# Patient Record
Sex: Female | Born: 1965 | Race: White | Hispanic: No | Marital: Married | Smoking: Former smoker
Health system: Southern US, Community
[De-identification: ages and names within clinical notes are randomized; demographics above are authoritative.]

## PROBLEM LIST (undated history)

## (undated) DIAGNOSIS — G4730 Sleep apnea, unspecified: Secondary | ICD-10-CM

## (undated) DIAGNOSIS — I10 Essential (primary) hypertension: Secondary | ICD-10-CM

## (undated) DIAGNOSIS — H9319 Tinnitus, unspecified ear: Secondary | ICD-10-CM

## (undated) DIAGNOSIS — G4733 Obstructive sleep apnea (adult) (pediatric): Secondary | ICD-10-CM

## (undated) DIAGNOSIS — T7840XA Allergy, unspecified, initial encounter: Secondary | ICD-10-CM

## (undated) DIAGNOSIS — G2581 Restless legs syndrome: Secondary | ICD-10-CM

## (undated) DIAGNOSIS — A6920 Lyme disease, unspecified: Secondary | ICD-10-CM

## (undated) DIAGNOSIS — F32A Depression, unspecified: Secondary | ICD-10-CM

## (undated) DIAGNOSIS — M1990 Unspecified osteoarthritis, unspecified site: Secondary | ICD-10-CM

## (undated) DIAGNOSIS — T07XXXA Unspecified multiple injuries, initial encounter: Secondary | ICD-10-CM

## (undated) DIAGNOSIS — F329 Major depressive disorder, single episode, unspecified: Secondary | ICD-10-CM

## (undated) DIAGNOSIS — E785 Hyperlipidemia, unspecified: Secondary | ICD-10-CM

## (undated) DIAGNOSIS — E669 Obesity, unspecified: Secondary | ICD-10-CM

## (undated) DIAGNOSIS — J45909 Unspecified asthma, uncomplicated: Secondary | ICD-10-CM

## (undated) DIAGNOSIS — E079 Disorder of thyroid, unspecified: Secondary | ICD-10-CM

## (undated) DIAGNOSIS — G43909 Migraine, unspecified, not intractable, without status migrainosus: Secondary | ICD-10-CM

## (undated) HISTORY — DX: Allergy, unspecified, initial encounter: T78.40XA

## (undated) HISTORY — DX: Restless legs syndrome: G25.81

## (undated) HISTORY — DX: Lyme disease, unspecified: A69.20

## (undated) HISTORY — DX: Unspecified multiple injuries, initial encounter: T07.XXXA

## (undated) HISTORY — DX: Depression, unspecified: F32.A

## (undated) HISTORY — PX: KNEE SURGERY: SHX244

## (undated) HISTORY — DX: Unspecified asthma, uncomplicated: J45.909

## (undated) HISTORY — DX: Unspecified osteoarthritis, unspecified site: M19.90

## (undated) HISTORY — DX: Sleep apnea, unspecified: G47.30

## (undated) HISTORY — DX: Obstructive sleep apnea (adult) (pediatric): G47.33

## (undated) HISTORY — DX: Hyperlipidemia, unspecified: E78.5

## (undated) HISTORY — DX: Disorder of thyroid, unspecified: E07.9

## (undated) HISTORY — PX: FRACTURE SURGERY: SHX138

## (undated) HISTORY — DX: Obesity, unspecified: E66.9

## (undated) HISTORY — DX: Essential (primary) hypertension: I10

## (undated) HISTORY — DX: Tinnitus, unspecified ear: H93.19

## (undated) HISTORY — DX: Major depressive disorder, single episode, unspecified: F32.9

## (undated) HISTORY — DX: Migraine, unspecified, not intractable, without status migrainosus: G43.909

---

## 1993-06-26 HISTORY — PX: LAPAROSCOPIC ABDOMINAL EXPLORATION: SHX6249

## 1998-09-24 HISTORY — PX: WRIST SURGERY: SHX841

## 1998-10-24 ENCOUNTER — Ambulatory Visit (HOSPITAL_BASED_OUTPATIENT_CLINIC_OR_DEPARTMENT_OTHER): Admission: RE | Admit: 1998-10-24 | Discharge: 1998-10-24 | Payer: Self-pay | Admitting: Orthopedic Surgery

## 2006-12-07 ENCOUNTER — Encounter: Admission: RE | Admit: 2006-12-07 | Discharge: 2006-12-07 | Payer: Self-pay | Admitting: Orthopedic Surgery

## 2008-10-24 HISTORY — PX: JOINT REPLACEMENT: SHX530

## 2008-10-31 ENCOUNTER — Inpatient Hospital Stay (HOSPITAL_COMMUNITY): Admission: RE | Admit: 2008-10-31 | Discharge: 2008-11-01 | Payer: Self-pay | Admitting: Orthopedic Surgery

## 2008-11-14 ENCOUNTER — Ambulatory Visit (HOSPITAL_COMMUNITY): Admission: RE | Admit: 2008-11-14 | Discharge: 2008-11-15 | Payer: Self-pay | Admitting: Orthopedic Surgery

## 2010-09-02 LAB — URINALYSIS, ROUTINE W REFLEX MICROSCOPIC
Bilirubin Urine: NEGATIVE
Glucose, UA: NEGATIVE mg/dL
Hgb urine dipstick: NEGATIVE
Ketones, ur: NEGATIVE mg/dL
Nitrite: NEGATIVE
Protein, ur: NEGATIVE mg/dL
Specific Gravity, Urine: 1.01 (ref 1.005–1.030)
Urobilinogen, UA: 0.2 mg/dL (ref 0.0–1.0)
pH: 6.5 (ref 5.0–8.0)

## 2010-09-02 LAB — BASIC METABOLIC PANEL
BUN: 5 mg/dL — ABNORMAL LOW (ref 6–23)
CO2: 27 mEq/L (ref 19–32)
Calcium: 8.9 mg/dL (ref 8.4–10.5)
Calcium: 9.6 mg/dL (ref 8.4–10.5)
Calcium: 8.9 mg/dL (ref 8.4–10.5)
Chloride: 104 mEq/L (ref 96–112)
Creatinine, Ser: 0.58 mg/dL (ref 0.4–1.2)
Creatinine, Ser: 0.56 mg/dL (ref 0.4–1.2)
GFR calc Af Amer: >60 mL/min (ref >60)
GFR calc non Af Amer: >60 mL/min (ref >60)
GFR calc non Af Amer: >60 mL/min (ref >60)
GFR calc non Af Amer: >60 mL/min (ref >60)
GFR calc non Af Amer: >60 mL/min (ref >60)
Glucose, Bld: 101 mg/dL — ABNORMAL HIGH (ref 70–99)
Glucose, Bld: 115 mg/dL — ABNORMAL HIGH (ref 70–99)
Glucose, Bld: 96 mg/dL (ref 70–99)
Glucose, Bld: 99 mg/dL (ref 70–99)
Potassium: 4.3 mEq/L (ref 3.5–5.1)
Potassium: 3.8 mEq/L (ref 3.5–5.1)
Potassium: 4.7 mEq/L (ref 3.5–5.1)
Sodium: 138 mEq/L (ref 135–145)
Sodium: 142 mEq/L (ref 135–145)
Sodium: 142 mEq/L (ref 135–145)

## 2010-09-02 LAB — DIFFERENTIAL
Basophils Absolute: 0 10*3/uL (ref 0.0–0.1)
Basophils Relative: 0 % (ref 0–1)
Eosinophils Relative: 3 % (ref 0–5)
Lymphocytes Relative: 24 % (ref 12–46)
Lymphocytes Relative: 26 % (ref 12–46)
Lymphs Abs: 2.6 10*3/uL (ref 0.7–4.0)
Monocytes Absolute: 0.9 10*3/uL (ref 0.1–1.0)
Monocytes Absolute: 0.6 10*3/uL (ref 0.1–1.0)
Neutro Abs: 8.8 10*3/uL — ABNORMAL HIGH (ref 1.7–7.7)
Neutrophils Relative %: 66 % (ref 43–77)

## 2010-09-02 LAB — TYPE AND SCREEN
ABO/RH(D): B POS
Antibody Screen: NEGATIVE
Antibody Screen: NEGATIVE

## 2010-09-02 LAB — CBC
HCT: 41.2 % (ref 36.0–46.0)
HCT: 44.7 % (ref 36.0–46.0)
Hemoglobin: 12.5 g/dL (ref 12.0–15.0)
Hemoglobin: 16.6 g/dL — ABNORMAL HIGH (ref 12.0–15.0)
Hemoglobin: 15 g/dL (ref 12.0–15.0)
MCHC: 34.5 g/dL (ref 30.0–36.0)
MCV: 82.5 fL (ref 78.0–100.0)
Platelets: 316 10*3/uL (ref 150–400)
Platelets: 257 10*3/uL (ref 150–400)
RDW: 13.4 % (ref 11.5–15.5)
RDW: 13.4 % (ref 11.5–15.5)
RDW: 13.6 % (ref 11.5–15.5)
WBC: 13.9 10*3/uL — ABNORMAL HIGH (ref 4.0–10.5)
WBC: 9.9 10*3/uL (ref 4.0–10.5)

## 2010-09-02 LAB — PROTIME-INR
INR: 1 (ref 0.00–1.49)
Prothrombin Time: 13.1 seconds (ref 11.6–15.2)

## 2010-09-02 LAB — PREGNANCY, URINE
Preg Test, Ur: NEGATIVE
Preg Test, Ur: NEGATIVE

## 2010-09-02 LAB — GLUCOSE, CAPILLARY: Glucose-Capillary: 111 mg/dL — ABNORMAL HIGH (ref 70–99)

## 2010-09-02 LAB — APTT: aPTT: 34 seconds (ref 24–37)

## 2010-10-08 NOTE — H&P (Signed)
NAME:  Candace Rodriguez, Candace Rodriguez           ACCOUNT NO.:  [id number]   MEDICAL RECORD NO.:  [id number]          PATIENT TYPE:  INP   LOCATION:  NA                           FACILITY:  [facility]   PHYSICIAN:  Madlyn Frankel. Charlann Boxer, M.D.  DATE OF BIRTH:  [birth year]   DATE OF ADMISSION:  11/14/2008  DATE OF DISCHARGE:                              HISTORY & PHYSICAL   PROCEDURE:  Left unicondylar knee replacement of the medial compartment.   CHIEF COMPLAINT:  Left knee pain.   HISTORY OF PRESENT ILLNESS:  A 45 year old female with a history of left  medial knee pain secondary to medial compartment osteoarthritis.  Has  been refractory to all conservative treatment.  She does have a recent  history of a right partial knee replacement of the medial compartment as  well.  She is making progress with that procedure.   PAST MEDICAL HISTORY:  1. Osteoarthritis.  2. Sleep apnea.  3. Migraines.  4. PCOS.  5. Degenerative disk disease.  6. Hypothyroidism.  7. Hypertension.  8. Dyslipidemia.   PAST SURGICAL HISTORY:  1. Right partial knee replacement in 2010.  2. Laparoscopic surgery 1995.  3. Wrist surgery 2000.   FAMILY HISTORY:  Hypertension, arthritis, hypothyroidism, coronary  artery disease, dementia.   SOCIAL HISTORY:  Married.  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 Primary care in the home postoperatively.   ALLERGIES:  PENICILLIN.   PRIMARY CARE PHYSICIAN:  Birdena Jubilee, nurse practitioner.   Other physicians include Dr. Sharene Skeans and Dr. Ernestina Penna.   MEDICATIONS:  1. Liothyronine 12.5 mcg daily.  2. Levoxyl 175 mcg daily.  3. Diovan HCT 160/25 p.o. daily.  4. Metformin 1000 mg p.o. daily.  5. Simvastatin 40 mg p.o. daily.  6. Requip 1 mg p.o. daily.  7. Etodolac 600 mg p.o. b.i.d.  8. Norethindrone 5 mg p.o. daily.  9. Fluticasone 2 puffs each nostril daily.  10.Xyzal 5 mg daily.  11.Phentermine 37.5 mg p.o. daily.  12.Vitamin B12 shot once a month.  13.Multivitamin daily.  14.Vitamin E daily.  15.Citracal daily.  16.B-complex daily  17.Osteobiflex daily.  18.Fish oil 1000 mg t.i.d.  19.Norco 7.5/325 one to two p.o. q.4 to6 p.r.n. pain.  20.Robaxin 500 mg 1 p.o. q.6 hours p.r.n. muscle spasm pain.  21.Iron 3125 mg 1 p.o. t.i.d.  22.Mobic 7.5 mg p.o. b.i.d.   REVIEW OF SYSTEMS:  RESPIRATORY:  She wears a CPAP at home. I will  require use in the hospital. Otherwise, see HIP.   PHYSICAL EXAMINATION:  VITAL SIGNS:  Pulse 120, respiratory rate 16,  blood pressure 140/90.  GENERAL:  Awake, alert and oriented.  HEENT:  Normocephalic.  NECK:  Supple. No carotid bruits.  CHEST/LUNGS:  Clear to auscultation bilaterally.  BREAST:  Deferred.  CARDIOVASCULAR:  S1 and S2 distinct.  ABDOMEN:  Soft, bowel sounds present.  PELVIS:  Stable.  GENITOURINARY:  Deferred.  EXTREMITIES:  Right knee incision healing well with no active drainage.  A little bit of redness around the incision.  Left knee has medial sided  knee pain and increased pain with weightbearing.  SKIN:  As  noted right knee incision healing well with some mild redness.  Left knee, no cellulitis.  NEUROLOGIC:  Intact distal sensibilities bilateral lower extremities.   LABORATORY DATA:  Labs, EKG, chest x-ray pending.   IMPRESSION:  Left medial knee osteoarthritis.   PLAN:  Plan of action is a left unicondylar knee replacement of the  medial compartment by Dr. Charlann Boxer at [facility] November 14, 2008.  Postoperative DVT prophylaxis will be aspirin.  Questions were  encouraged, answered and reviewed.     ______________________________  Yetta Glassman [address], [place]      Madlyn Frankel. Charlann Boxer, M.D.  Electronically Signed    BLM/MEDQ  D:  11/08/2008  T:  11/08/2008  Job:  [id number]   cc:   Birdena Jubilee, CNP   Dr. Cydney Ok Buist  Fax: [phone]

## 2010-10-08 NOTE — H&P (Signed)
Candace Rodriguez, YEATS           ACCOUNT NO.:  [id number]   MEDICAL RECORD NO.:  [id number]          PATIENT TYPE:  INP   LOCATION:  NA                           FACILITY:  [facility]   PHYSICIAN:  Madlyn Frankel. Charlann Boxer, M.D.  DATE OF BIRTH:  01-20-1966   DATE OF ADMISSION:  10/31/2008  DATE OF DISCHARGE:                              HISTORY & PHYSICAL   PROCEDURE:  Right unicondylar knee replacement of the medial  compartment.   CHIEF COMPLAINTS:  Right knee pain.   HISTORY OF PRESENT ILLNESS:  A 45 year old female with a history of  right medial knee pain secondary to medial compartment osteoarthritis.  It has been refractory to all conservative treatment.  She is scheduled  for a right partial knee replacement, to be followed in 2 weeks by left  partial knee replacement.   PAST MEDICAL HISTORY:  1. Osteoarthritis.  2. Sleep apnea.  3. Migraines.  4. PCOS.  5. Degenerative disk disease.  6. Hypothyroidism.  7. Hypertension.  8. Dyslipidemia.   PREVIOUS SURGERIES:  1. Laparoscopic surgery in 1995.  2. Wrist surgery in 2000.   FAMILY HISTORY:  Hypertension, arthritis, hypothyroidism, coronary  artery disease, dementia.   SOCIAL HISTORY:  She is married.  She is a Warehouse manager  with a Master's degree.  Primary caregiver will be family in the home.   DRUG ALLERGIES:  PENICILLIN.   PRIMARY CARE PHYSICIAN:  Paulita Cradle, nurse practitioner.   OTHER PHYSICIANS:  Include Dr. Erma Pinto and Dr. Ernestina Penna.   MEDICATIONS:  1. Liothyronine 12.5 mcg daily.  2. Levoxyl 175 mcg daily.  3. Diovan/HCT 160/25 p.o. daily.  4. Metformin 1000 mg p.o. daily.  5. Simvastatin 40 mg p.o. daily.  6. Requip 1 mg p.o. daily.  7. Etodolac 600 mg p.o. b.i.d.  8. Norethindrone 5 mg p.o. daily.  9. Fluticasone 2 puffs each nostril daily.  10.Xyzal 5 mg daily.  11.Phentermine 37.5 mg p.o. daily.  12.Vitamin B12 shot once a month.  13.Multivitamin daily.  14.Vitamin E  daily.  15.Citracal daily.  16.B complex daily.  17.Osteo Bi-Flex daily.  18.Fish oil 1000 mg t.i.d.   REVIEW OF SYSTEMS:  HEENT:  She has ringing in her ears.  CARDIOVASCULAR:  Last EKG was in March 2009.  MUSCULOSKELETAL:  She has  joint pain, back pain, morning stiffness.  Otherwise see HPI.   PHYSICAL EXAMINATION:  VITAL SIGNS:  Pulse is 84, respirations 16, blood  pressure 134/82.  GENERAL:  Awake, alert and oriented.  HEENT:  Normocephalic.  NECK:  Supple.  No carotid bruits.  CHEST:  Lungs clear to auscultation bilaterally.  BREASTS:  Deferred.  HEART:  S1 and S2 distinct.  ABDOMEN:  Soft, bowel sounds present.  PELVIS:  Stable.  GENITOURINARY:  Deferred.  EXTREMITIES:  Bilateral knee pain with range of motion and with  weightbearing, predominantly medial side.  SKIN:  No cellulitis  NEUROLOGIC:  Intact distal sensibilities.   LABORATORY DATA:  Labs, EKG, chest x-ray all pending presurgical  testing.   IMPRESSION:  Right medial osteoarthritis of the knee.   Plan of action is a  right unicondylar knee replacement of the medial  compartment by Dr. Durene Romans at [facility] on October 31, 2008.  Risks and complications were discussed.  Postoperative medications were  provided.     ______________________________  Yetta Glassman [address], [place]      Madlyn Frankel. Charlann Boxer, M.D.  Electronically Signed    BLM/MEDQ  D:  10/25/2008  T:  10/25/2008  Job:  [id number]   cc:   Paulita Cradle, NP   Dr. Jeryl Columbia  Fax: [phone]

## 2010-10-08 NOTE — Op Note (Signed)
Candace Rodriguez, Candace Rodriguez           ACCOUNT NO.:  [id number]   MEDICAL RECORD NO.:  [id number]          PATIENT TYPE:  INP   LOCATION:  1611                         FACILITY:  [facility]   PHYSICIAN:  Madlyn Frankel. Charlann Boxer, M.D.  DATE OF BIRTH:  01/19/1966   DATE OF PROCEDURE:  11/14/2008  DATE OF DISCHARGE:                               OPERATIVE REPORT   PREOPERATIVE DIAGNOSIS:  Left knee medial compartment osteoarthritis.   POSTOPERATIVE DIAGNOSIS:  Left knee medial compartment osteoarthritis.   PROCEDURE:  Left partial knee replacement utilizing Biomet Oxford knee  system, size medium femur, A left tibia and a 4 mm insert to match the  medial femur with the left A tibia.   SURGEON:  Madlyn Frankel. Charlann Boxer, M.D.   ASSISTANT:  Dwyane Luo, PA-C.   ANESTHESIA:  Spinal.   ESTIMATED BLOOD LOSS:  150 mL.   TOURNIQUET TIME:  47 minutes at 250 mmHg.   DRAINS:  One Hemovac.   COMPLICATIONS:  None.   INDICATIONS:  Ms. Kossman is a 45 year old female patient known to me  with bilateral knee medial compartment degenerative changes,  successfully undergone a right partial knee.  She is here for scheduled  left partial knee.  Risks and benefits have been reviewed at the time of  her history and physical.  Consent was obtained for benefit of pain  relief.   PROCEDURE IN DETAIL:  The patient was brought to the operative theater.  Once adequate anesthesia, preoperative antibiotics, Cleocin  administered, the patient was positioned supine on the table.  Her left  lower extremity was placed into a thigh tourniquet.  It was then  positioned onto the leg holder to allow for 120 degrees of flexion.  The  left lower extremity was then pre scrubbed, prepped and draped in  sterile fashion.  A time-out was performed identifying the patient and  planned procedure and extremity.  The leg was exsanguinated, tourniquet  elevated to 250 mmHg.  Paramidline incision was made from proximal pole  of the patella to the  tubercle.  Soft tissue dissection was carried down  to the extensor mechanism freeing up the soft tissues.   A median arthrotomy was made.  Medial subluxation of the patella was  carried out.  Following an initial synovial debridment and osteophyte  debridement, attention was directed to the tibia.  With an  extramedullary guide positioned underneath the osteophytic ridge and  held perpendicular to the tibial shaft to assure good cut, a  reciprocating saw was used first followed by an oscillating saw.  I  checked the space with a size 8 tibial tray, size A in addition to the 4  feeler gauge it was tight.  For this reason I had kept the pin in  position and took the extramedullary guide down one notch removing  another 3 mm of bone.  With this, the 4 feeler gauge felt good with the  appropriate tension.   The pin was removed.  Attention was now directed to the femur.  Femoral  canal was opened with a drill, then used the hand awl and an  intramedullary rod passed  easily by hand.   I then placed a size 3 feeler gauge in the posterior cutting block,  drill hole guide onto the feeler gauge and had it oriented into the  middle third of the medial femoral condyle and oriented in respect to  the intramedullary rod.  The posterior cut was then made and bone wafer  removed.   At this point, I went ahead and used a size 4 spigot and milled the  distal femur.  Excessive bone was removed with osteotomes and rongeur.  Trial reduction now carried out with an A tibia, a medium femur.  At  this point the 4 feeler gauge felt again good in flexion.  However in  extension, it was a little bit tight.  I removed the trial, placed a 5  spigot and then milled the distal femur again and retrialed and was  happy that the tension appeared to be balanced in extension and flexion.  At this point, the trial components removed.  The tibial tray was held  in place, pinned into its correct orientation against the  cut surface of  the spine and the reciprocating saw used to create the trough.  I used  the osteotome to create the trough for the keel.   The trial reduction was now carried out with a medium femur.  The keeled  tibia and the lollipop retractors size 4 in an appropriate amount of  tension at 90 degrees of flexion and 20 degrees of flexion.  Given these  parameters, all trial components were removed.  We drilled the sclerotic  bone.  I injected the medial synovial capsule junction with 30 mL of  0.25% Marcaine with 1 mL of Toradol.  Cement was mixed.  The knee was  irrigated with normal saline solution.  Final components opened.   Final components were cemented in a semi 2 staged fashion with tibial  component cemented first, the knee held at 45 degrees of flexion for 2  minutes and the femoral component.  At each step, excessive cement was  removed.  Once cement had cured, excessive cement was removed throughout  the knee.  Once I was satisfied I was unable to visualize any loose  cement, I retrialed to make certain that the size 4 component would feel  good.  Once I was satisfied here,  the final 4 components matched the  medium femur, it was then opened and inserted.  The knee was irrigated  at various points throughout the case and again at this point,  tourniquet was let down.  There was no significant hemostasis required.  A medium Hemovac drain was placed deep.  I then reapproximated the  extensor mechanism and 9 degrees of flexion with #1 Vicryl and remainder  of wound was closed with 2-0 Vicryl and running 4-0 Monocryl.  The knee  was cleaned, dried and dressed sterilely with Steri-Strips and placed in  a bulky Jones dressing.  She tolerated the procedure well and was  brought to recovery room stable.      Madlyn Frankel Charlann Boxer, M.D.  Electronically Signed     MDO/MEDQ  D:  11/14/2008  T:  11/14/2008  Job:  [id number]

## 2010-10-08 NOTE — Op Note (Signed)
NAMENIQUITA, DIGIOIA           ACCOUNT NO.:  [id number]   MEDICAL RECORD NO.:  [id number]          PATIENT TYPE:  INP   LOCATION:  0003                         FACILITY:  [facility]   PHYSICIAN:  Madlyn Frankel. Charlann Boxer, M.D.  DATE OF BIRTH:  [birth year]   DATE OF PROCEDURE:  DATE OF DISCHARGE:                               OPERATIVE REPORT   PREOPERATIVE DIAGNOSIS:  Right medial compartment osteoarthritis.   POSTOPERATIVE DIAGNOSIS:  Right medial compartment osteoarthritis.   PROCEDURE:  Right partial knee replacement.   COMPONENTS USED:  The Biomet Oxford knee system, size medium femur,  sized B medial tibial component with a size 4 insert to match the medial  femoral component for the right tibia.   SURGEON:  Madlyn Frankel. Charlann Boxer, M.D.   ASSISTANT:  Surgical techs.   ANESTHESIA:  Spinal block.   FINDING:  None.   SPECIMENS:  None.   COMPLICATIONS/DRAINS:  One Hemovac.   TOURNIQUET TIME:  38 minutes at 250 mmHg.   INDICATION FOR THE PROCEDURE:  Ms. Junio is a 45 year old female  who presented to the office for evaluation of knee pain.  We had  unsuccessful in managing her knee pain with conservative measures.  This  included injections, anti-inflammatory medication, attempted weight loss  and strengthening.  She wished to discuss more definitive measures.  Her  predominant pain was in the medial side of the knee.  This correlated  with the radiographic findings of bone on bone arthritis.  After  reviewing the risks of infection, DVT, the concerns about her age and  size, consent was obtained for a right partial knee replacement for  benefit of pain relief.   PROCEDURE IN DETAIL:  The patient was brought to operative theater.  Once adequate anesthesia, preoperative antibiotics, clindamycin  administered, the patient was positioned supine.  The thigh tourniquet  was placed in the right thigh.  The right leg was then positioned over  the Oxford leg holder to allow for flexion to  120 degrees in full  extension.  We then prepped and draped the right lower extremity from  tourniquet to the toes using the following pre scrub.  Time-out was  performed identifying the patient and procedure and extremity.   The leg was exsanguinated and the tourniquet elevated to 250 mmHg.  Paramidline incision was made just proximal to pole of patella and  tibial tubercle.  Sharp dissection was carried down to the extensor  mechanism creating soft tissue planes.  A median arthrotomy was then  created.  The patella was subluxated laterally with debridement synovium  and osteophytes was carried out per protocol.   An extramedullary guide was then placed just underneath the osteophytic  rim of the medial proximal tibia.  The reciprocating saw was then aimed  towards the hip joint.  The oscillating saw was used to remove the bone  fragment.  The cut surface appeared to be best fit with size B tibia.  The 4-mm feeler gauge seemed to fit into this space with appropriate  tension.   Attention was now directed to the femoral side and drill was used open  up  the distal femoral canal and then augmented with the femoral awl.  An  intramedullary rod was passed into the femoral canal.   __________ feeler gauge was set on top of the tibia and the posterior  femoral cutting guide inserted.  Once it was parallel in all planes that  were necessary in the middle of the medial femoral condyle, two drill  holes were drilled and confirmed.  The posterior cutting block was then  placed and the posterior femoral cut made.   Based on the size 4 feeler gauge and experience with these knees and  medial compartment osteoarthritis, a 4 spigot was inserted in the distal  femur milled.  A trial reduction now carried out using a 4 feeler gauge  which had appropriate tension in flexion and in 20 degrees of flexion  had the same type of tension.  This indicated we had a balanced knee.   At this point a trial  components removed.  I held the tibial component  in placed, pinned it in position using reciprocating saw to create the  trough for the keel and then used an osteotome to remove further debris.   The trial reduction was again carried out with a B keeled tibial  component and medium femoral component and a lollipop size 4.  Again at  90 degrees of flexion and 20 degrees flexion, the knee appeared to be  stable and balanced with enough play in the polyethylene to match each  other.   At this point all trial components removed.  I drilled sclerotic bone  with a drill.  The final components were opened and mixed.  The synovial  capsule junction was injected with 30 mL of 0.25% Marcaine with  epinephrine and Toradol.  Tibial component was cemented in first.  The  knee was brought to 45 degrees of flexion with the trial femur in place  and extruded cement removed.  After 2 minutes the femoral component was  put in position and the knee held at  45 degrees flexion for cement  cure.  Excessive cement was removed at this point.  Once I was satisfied  I was unable to visualize any further cement and based on the trial  reduction, we found a 4 insert matched the medial femoral condyle and  the right side was then inserted.   We reirrigated the knee at this point, tourniquet was let down at 38  minutes.  A medium Hemovac drain was placed deep.  Extensor mechanism  was then reapproximated using #1Vicryl with the knee in flexion.  The  remainder of the wound was closed with 2-0 Vicryl and running 4-0  Monocryl.  The knee was cleaned, dried and dressed sterilely with Steri-  Strips and bulky sterile wrap.  She was brought to the recovery room in  stable condition tolerating procedure well.      Madlyn Frankel Charlann Boxer, M.D.  Electronically Signed     MDO/MEDQ  D:  10/31/2008  T:  10/31/2008  Job:  [id number]

## 2010-12-18 LAB — HM DIABETES EYE EXAM

## 2012-05-24 LAB — BASIC METABOLIC PANEL
BUN: 12 mg/dL (ref 4–21)
Creatinine: 0.5 mg/dL (ref 0.5–1.1)

## 2012-07-12 LAB — LIPID PANEL
HDL: 27 mg/dL — AB (ref 35–70)
LDL Cholesterol: 69 mg/dL

## 2012-07-31 ENCOUNTER — Encounter: Payer: Self-pay | Admitting: *Deleted

## 2012-08-12 ENCOUNTER — Ambulatory Visit (INDEPENDENT_AMBULATORY_CARE_PROVIDER_SITE_OTHER): Payer: BC Managed Care – PPO | Admitting: Pharmacist

## 2012-08-12 VITALS — BP 122/74 | Ht 68.0 in | Wt 306.0 lb

## 2012-08-12 DIAGNOSIS — E785 Hyperlipidemia, unspecified: Secondary | ICD-10-CM

## 2012-08-12 NOTE — Progress Notes (Signed)
  Subjective:    Patient ID: Candace Rodriguez, female    DOB: 08/27/1965, 47 y.o.   MRN: [id number]  HPI Comments: Patient is here for weight management secondary to morbid obesity.  She is planning to have either gastric bypass or gastric sleeve surgery in future and her insurance requires that she enroll in a physician supervised diet management program.   She has lost a total of 42 pounds since starting at our clinic in November 2013.  Patient has multiple comorbidities complicated by obesity including HTN, Hypertrigluceridemia / dyslipidemia, degenerative disc disease and asthma.      Review of Systems  Constitutional: Positive for activity change (increase in activity in Feb including biking, but has decreased in March due to allergies.). Negative for appetite change and fatigue.  Respiratory: Positive for wheezing (Relieved by rescue inhaler.  Not using daily. ). Negative for apnea, cough, choking, chest tightness, shortness of breath and stridor.   Cardiovascular: Negative for chest pain.  Endocrine: Negative for polydipsia, polyphagia and polyuria.  Musculoskeletal: Positive for back pain (Occassional.  Patient reported on significant increase in pain over last month.). Negative for myalgias, joint swelling and gait problem.  Neurological: Negative for dizziness.   Diet Review:  Pt brings in food diary - reviewed.  Averages about 1600kcal/day. Continues to try to increase non starchy vegetable intake and fruits, lean proteins    Objective:   Physical Exam  Constitutional: She appears well-developed and well-nourished.  Cardiovascular: Normal rate and regular rhythm.           Assessment & Plan:  Assessment: Morbid Obesity - decreased weight over last month is 5lbs and a total of 42 lbs since 03/2012 Hypertriglyceriemia - improved per labs from 07/12/2012 Low HDL - improved from 24 to 27 from 05/24/2012 to 07/12/2012 HTN - well controlled   Plan No medication changes  today Continue caloric restrictive meal - goal 1500 to 1600 kcal per day Restart biking on a regular basis Recommended small 100 calorie snack in early afternoon to help with increased hunger between 5pm and 8pm. RTC 1 month

## 2012-08-23 ENCOUNTER — Other Ambulatory Visit: Payer: Self-pay

## 2012-08-23 MED ORDER — DICLOFENAC SODIUM 75 MG PO TBEC: 75.0000 mg | DELAYED_RELEASE_TABLET | Freq: Two times a day (BID) | ORAL | Status: DC

## 2012-08-23 NOTE — Telephone Encounter (Signed)
Do not see this med listed in chart

## 2012-09-09 ENCOUNTER — Other Ambulatory Visit: Payer: Self-pay

## 2012-09-09 MED ORDER — LEVOTHYROXINE SODIUM 75 MCG PO TABS: 75.0000 ug | ORAL_TABLET | Freq: Every day | ORAL | Status: DC

## 2012-09-13 ENCOUNTER — Ambulatory Visit (INDEPENDENT_AMBULATORY_CARE_PROVIDER_SITE_OTHER): Payer: BC Managed Care – PPO | Admitting: Pharmacist

## 2012-09-13 ENCOUNTER — Encounter: Payer: Self-pay | Admitting: Pharmacist

## 2012-09-13 DIAGNOSIS — E785 Hyperlipidemia, unspecified: Secondary | ICD-10-CM | POA: Insufficient documentation

## 2012-09-13 DIAGNOSIS — M1990 Unspecified osteoarthritis, unspecified site: Secondary | ICD-10-CM | POA: Insufficient documentation

## 2012-09-13 DIAGNOSIS — F32A Depression, unspecified: Secondary | ICD-10-CM | POA: Insufficient documentation

## 2012-09-13 DIAGNOSIS — M109 Gout, unspecified: Secondary | ICD-10-CM | POA: Insufficient documentation

## 2012-09-13 DIAGNOSIS — E039 Hypothyroidism, unspecified: Secondary | ICD-10-CM | POA: Insufficient documentation

## 2012-09-13 DIAGNOSIS — G2581 Restless legs syndrome: Secondary | ICD-10-CM

## 2012-09-13 DIAGNOSIS — F329 Major depressive disorder, single episode, unspecified: Secondary | ICD-10-CM | POA: Insufficient documentation

## 2012-09-13 DIAGNOSIS — I10 Essential (primary) hypertension: Secondary | ICD-10-CM | POA: Insufficient documentation

## 2012-09-13 DIAGNOSIS — G43909 Migraine, unspecified, not intractable, without status migrainosus: Secondary | ICD-10-CM | POA: Insufficient documentation

## 2012-09-13 HISTORY — DX: Morbid (severe) obesity due to excess calories: E66.01

## 2012-09-13 NOTE — Progress Notes (Signed)
  Subjective:    Patient ID: Florinda Marker, female    DOB: 1965/08/16, 47 y.o.   MRN: [id number]  HPI Patient has been coming to [facility] for counseling regarding weight loss since 04/08/2012.   Mrs. Wilbourn also sees a Health and safety inspector at [facility].   The patient is planning to have bariatric surgery and her insurance required that she have 6 months of physician supervised weight loss counseling and education.  Mrs. Paulding has changed her dietary intake and physical activity habits tremendously and at our last visit had lost a total of 42# since November and BMI has decreased from 52.9 in November to 46.54 in March 2014.  Co-morbid conditions - dyslipidemia, hypertension, DJD, depression  Body mass index is 45.36 kg/(m^2).  Filed Vitals:   09/13/12 1505  BP: 112/72   Filed Weights   09/13/12 1505  Weight: 298 lb 4 oz (135.285 kg)   Lipid Panel     Component Value Date/Time   CHOL 125 07/12/2012   TRIG 146 07/12/2012   HDL 27* 07/12/2012   LDLCALC 69 07/12/2012      Review of Systems     Objective:   Physical Exam       Assessment & Plan:  Assessment: 1. Obesity - continue to see weight loss with current diet and exercise plan 2.  HTN- well controlled 3. Dyslipidemia - at goals - improvements seen with weight loss   Plan: 1.  Continue calorie restrictive diet - current goal is 1600 - 1700 calories per day 2. Continue increased physical activity - goal 30 minutes daily at least 4 days per week 3.  Continue current BP medication Diovan HCT 320.25mg  1/2 tablet daily 4.  Continue current lipid lowering regimen Crestor 20mg  qd  RTC 1 month  Physician time spent with patient - 5 minutes Total time spent with paient - 15 minutes  Rudi Heap, MD and Henrene Pastor, PharmD, CPP

## 2012-09-17 ENCOUNTER — Other Ambulatory Visit: Payer: Self-pay

## 2012-09-17 MED ORDER — ROSUVASTATIN CALCIUM 20 MG PO TABS: 20.0000 mg | ORAL_TABLET | Freq: Every day | ORAL | Status: DC

## 2012-10-07 ENCOUNTER — Other Ambulatory Visit: Payer: Self-pay

## 2012-10-07 MED ORDER — LEVOTHYROXINE SODIUM 175 MCG PO TABS: 175.0000 ug | ORAL_TABLET | Freq: Every day | ORAL | Status: DC

## 2012-10-07 MED ORDER — METFORMIN HCL 1000 MG PO TABS: ORAL_TABLET | ORAL | Status: DC

## 2012-10-14 ENCOUNTER — Ambulatory Visit (INDEPENDENT_AMBULATORY_CARE_PROVIDER_SITE_OTHER): Payer: BC Managed Care – PPO | Admitting: Pharmacist

## 2012-10-14 VITALS — BP 112/72 | HR 70 | Ht 68.0 in | Wt 293.0 lb

## 2012-10-14 DIAGNOSIS — I10 Essential (primary) hypertension: Secondary | ICD-10-CM

## 2012-10-14 DIAGNOSIS — E785 Hyperlipidemia, unspecified: Secondary | ICD-10-CM

## 2012-10-14 DIAGNOSIS — R635 Abnormal weight gain: Secondary | ICD-10-CM

## 2012-10-14 NOTE — Progress Notes (Signed)
  Subjective:    Patient ID: Candace Rodriguez, female    DOB: 08-16-1965, 47 y.o.   MRN: [id number]  HPI Candace Rodriguez here today for reassessment of obesity and related chronic conditions.  She is planning to have bariatric surgery.  Patient has been coming to [facility] for counseling regarding weight loss since 04/08/2012.   Candace Rodriguez also sees a Health and safety inspector at [facility]. The patient continues to keep daily records of dietary intake and physical activity.  She averages 1600 kcal per day.  Her health, diet and physical activity habits have improved tremendously over the last 6 months and at our last visit she had lost a total of 50 lbs since November and BMI has decreased from 52.9 in November to 45.36 in April 2014.  Co-morbid conditions - dyslipidemia, hypertension, DJD, depression  Filed Weights   10/14/12 1512  Weight: 293 lb (132.904 kg)   Body mass index is 44.56 kg/(m^2).  Filed Vitals:   10/14/12 1512  BP: 112/72  Pulse: 70     Lipid Panel     Component Value Date/Time   CHOL 125 07/12/2012   TRIG 146 07/12/2012   HDL 27* 07/12/2012   LDLCALC 69 07/12/2012            Assessment & Plan:  Assessment: 1. Obesity - continued weight loss with current diet and exercise plan 2.  HTN- well controlled 3. Dyslipidemia - at goals - improvements seen with weight loss   Plan: 1.  Continue calorie restrictive diet - current goal is 1600 - 1700 calories per day 2. Continue increased physical activity - goal 30 minutes daily at least 4 days per week 3.  Continue current BP medication Diovan HCT 320.25mg  1/2 tablet daily 4.  Continue current lipid lowering regimen Crestor 20mg     Physician time spent with patient - 5 minutes Total time spent with paient - 20 minutes  Rudi Heap, MD and Henrene Pastor, PharmD, CPP

## 2012-10-22 ENCOUNTER — Other Ambulatory Visit: Payer: Self-pay

## 2012-10-22 MED ORDER — DICLOFENAC SODIUM 75 MG PO TBEC: 75.0000 mg | DELAYED_RELEASE_TABLET | Freq: Two times a day (BID) | ORAL | Status: DC

## 2012-11-11 ENCOUNTER — Ambulatory Visit (INDEPENDENT_AMBULATORY_CARE_PROVIDER_SITE_OTHER): Payer: BC Managed Care – PPO | Admitting: General Practice

## 2012-11-11 ENCOUNTER — Encounter: Payer: Self-pay | Admitting: General Practice

## 2012-11-11 ENCOUNTER — Telehealth: Payer: Self-pay | Admitting: General Practice

## 2012-11-11 VITALS — BP 117/76 | HR 89 | Temp 98.7°F | Ht 68.0 in | Wt 279.0 lb

## 2012-11-11 DIAGNOSIS — B353 Tinea pedis: Secondary | ICD-10-CM

## 2012-11-11 DIAGNOSIS — R6889 Other general symptoms and signs: Secondary | ICD-10-CM

## 2012-11-11 DIAGNOSIS — R7989 Other specified abnormal findings of blood chemistry: Secondary | ICD-10-CM

## 2012-11-11 DIAGNOSIS — E039 Hypothyroidism, unspecified: Secondary | ICD-10-CM

## 2012-11-11 MED ORDER — LEVOTHYROXINE SODIUM 150 MCG PO TABS: 150.0000 ug | ORAL_TABLET | Freq: Every day | ORAL | Status: DC

## 2012-11-11 MED ORDER — TERBINAFINE HCL 250 MG PO TABS: 250.0000 mg | ORAL_TABLET | Freq: Every day | ORAL | Status: DC

## 2012-11-11 NOTE — Patient Instructions (Addendum)
Ringworm, Nail A fungal infection of the nail (tinea unguium/onychomycosis) is common. It is common as the visible part of the nail is composed of dead cells which have no blood supply to help prevent infection. It occurs because fungi are everywhere and will pick any opportunity to grow on any dead material. Because nails are very slow growing they require up to 2 years of treatment with anti-fungal medications. The entire nail back to the base is infected. This includes approximately  of the nail which you cannot see. If your caregiver has prescribed a medication by mouth, take it every day and as directed. No progress will be seen for at least 6 to 9 months. Do not be disappointed! Because fungi live on dead cells with little or no exposure to blood supply, medication delivery to the infection is slow; thus the cure is slow. It is also why you can observe no progress in the first 6 months. The nail becoming cured is the base of the nail, as it has the blood supply. Topical medication such as creams and ointments are usually not effective. Important in successful treatment of nail fungus is closely following the medication regimen that your doctor prescribes. Sometimes you and your caregiver may elect to speed up this process by surgical removal of all the nails. Even this may still require 6 to 9 months of additional oral medications. See your caregiver as directed. Remember there will be no visible improvement for at least 6 months. See your caregiver sooner if other signs of infection (redness and swelling) develop. Document Released: 05/09/2000 Document Revised: 08/04/2011 Document Reviewed: 07/18/2008 ExitCare Patient Information 2014 [employer].  

## 2012-11-11 NOTE — Progress Notes (Signed)
  Subjective:    Patient ID: Candace Rodriguez, female    DOB: 04-14-1966, 47 y.o.   MRN: [id number]  HPI Presents today to discuss labs and possible medication change. Reports receiving preoperative labs and TSH was low. She provided proof of lab work. She reports that she began taking levothyroxine in December and no additional lab work since then. Patient reports she usually has TSH level checked yearly. She reports having a left great toenail fungus. She reports she will be having a gastric sleeve procedure on Monday.     Review of Systems  Constitutional: Negative for fever and chills.  HENT: Negative for neck pain.   Eyes: Negative for visual disturbance.  Respiratory: Negative for chest tightness and shortness of breath.   Cardiovascular: Negative for chest pain and palpitations.  Gastrointestinal: Negative for blood in stool.  Genitourinary: Negative for difficulty urinating.  Skin:       Left great toenail fungus  Neurological: Negative for dizziness, weakness and headaches.  All other systems reviewed and are negative.       Objective:   Physical Exam  Constitutional: She is oriented to person, place, and time. She appears well-developed and well-nourished.  HENT:  Head: Normocephalic and atraumatic.  Right Ear: External ear normal.  Left Ear: External ear normal.  Neck: Normal range of motion. Neck supple.  Cardiovascular: Normal rate, regular rhythm and normal heart sounds.   Pulmonary/Chest: Effort normal and breath sounds normal. No respiratory distress. She exhibits no tenderness.  Neurological: She is alert and oriented to person, place, and time.  Skin: Skin is warm and dry.  Psychiatric: She has a normal mood and affect.          Assessment & Plan:  1. Unspecified hypothyroidism -levothyroxine daily, 30 minutes before breakfast 2. Tinea pedis - terbinafine (LAMISIL) 250 MG tablet; Take 1 tablet (250 mg total) by mouth daily.  Dispense: 14  tablet; Refill: 0 -keep feet clean and dry   3. Abnormal TSH - Thyroid Panel With TSH; Future -take medications as prescribed F/u in 6 weeks for TSH recheck Discussed exercise and diet  Patient verbalized understanding Coralie Keens, FNP-C

## 2012-11-11 NOTE — Telephone Encounter (Signed)
appt made for pt today

## 2012-11-16 DIAGNOSIS — J45909 Unspecified asthma, uncomplicated: Secondary | ICD-10-CM | POA: Insufficient documentation

## 2012-11-16 HISTORY — PX: PARTIAL GASTRECTOMY: SHX2172

## 2012-11-29 ENCOUNTER — Telehealth: Payer: Self-pay | Admitting: General Practice

## 2012-11-29 NOTE — Telephone Encounter (Signed)
appt given for the am 

## 2012-11-30 ENCOUNTER — Ambulatory Visit (INDEPENDENT_AMBULATORY_CARE_PROVIDER_SITE_OTHER): Payer: BC Managed Care – PPO | Admitting: General Practice

## 2012-11-30 DIAGNOSIS — T148XXA Other injury of unspecified body region, initial encounter: Secondary | ICD-10-CM

## 2012-11-30 MED ORDER — CYCLOBENZAPRINE HCL 5 MG PO TABS: 5.0000 mg | ORAL_TABLET | Freq: Three times a day (TID) | ORAL | Status: DC | PRN

## 2012-11-30 NOTE — Patient Instructions (Signed)
Muscle Strain  Muscle strain occurs when a muscle is stretched beyond its normal length. A small number of muscle fibers generally are torn. This is especially common in athletes. This happens when a sudden, violent force placed on a muscle stretches it too far. Usually, recovery from muscle strain takes 1 to 2 weeks. Complete healing will take 5 to 6 weeks.   HOME CARE INSTRUCTIONS    While awake, apply ice to the sore muscle for the first 2 days after the injury.   Put ice in a plastic bag.   Place a towel between your skin and the bag.   Leave the ice on for 15-20 minutes each hour.   Do not use the strained muscle for several days, until you no longer have pain.   You may wrap the injured area with an elastic bandage for comfort. Be careful not to wrap it too tightly. This may interfere with blood circulation or increase swelling.   Only take over-the-counter or prescription medicines for pain, discomfort, or fever as directed by your caregiver.  SEEK MEDICAL CARE IF:   You have increasing pain or swelling in the injured area.  MAKE SURE YOU:    Understand these instructions.   Will watch your condition.   Will get help right away if you are not doing well or get worse.  Document Released: 05/12/2005 Document Revised: 08/04/2011 Document Reviewed: 05/24/2011  ExitCare Patient Information 2014 [employer].

## 2012-11-30 NOTE — Progress Notes (Signed)
  Subjective:    Patient ID: Candace Rodriguez, female    DOB: September 18, 1965, 47 y.o.   MRN: [id number]  HPI Presents today with complaints of back pain and pain in pelvic area with lifting of legs, upon walking. She reports having an abdominal procedure (gastric sleeve) November 16, 2012. She denies complications after surgery. She reports having back problems in the past. She also reports an episode of pelvic pressure, but hasn't felt that pressure since Sunday. Reports having a CT scan two weeks ago. She reports contacting her surgeon and suggests seeing PCP initially. She reports taking hydrocodone liquid as prescribed by surgeon.    Review of Systems  Constitutional: Negative for chills.  HENT: Negative for neck pain and neck stiffness.   Respiratory: Negative for chest tightness and shortness of breath.   Cardiovascular: Negative for palpitations.  Gastrointestinal: Negative for blood in stool.  Genitourinary: Negative for hematuria and difficulty urinating.  Neurological: Negative for dizziness.       Objective:   Physical Exam  Constitutional: She is oriented to person, place, and time. She appears well-developed and well-nourished.  HENT:  Head: Normocephalic and atraumatic.  Cardiovascular: Normal rate, regular rhythm and normal heart sounds.   Pulmonary/Chest: Effort normal and breath sounds normal. No respiratory distress. She exhibits no tenderness.  Abdominal: Soft. Bowel sounds are normal. She exhibits no distension. There is no tenderness.  Musculoskeletal:  Tenderness in pelvic and lower abdomen when patient lifts legs. Left greater than right.   Neurological: She is alert and oriented to person, place, and time.  Skin: Skin is warm and dry.  Psychiatric: She has a normal mood and affect.          Assessment & Plan:  1. Muscle strain - cyclobenzaprine (FLEXERIL) 5 MG tablet; Take 1 tablet (5 mg total) by mouth 3 (three) times daily as needed for muscle spasms.   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

## 2012-12-03 ENCOUNTER — Other Ambulatory Visit: Payer: Self-pay | Admitting: Nurse Practitioner

## 2012-12-03 ENCOUNTER — Telehealth: Payer: Self-pay | Admitting: *Deleted

## 2012-12-03 MED ORDER — MAGIC MOUTHWASH: 10.0000 mL | Freq: Four times a day (QID) | ORAL | Status: DC

## 2012-12-03 NOTE — Telephone Encounter (Signed)
Patient has white sore inside of mouth. Thinks it may be coming from flexeril that she was just prescribed by Mae but has also taken antibiotics in the last two weeks. Has stopped the flexeril. Can she have some magic mouth wash? Please advise

## 2012-12-03 NOTE — Telephone Encounter (Signed)
rx sent to pharmacy

## 2012-12-03 NOTE — Telephone Encounter (Signed)
Patient notified

## 2012-12-20 ENCOUNTER — Other Ambulatory Visit: Payer: Self-pay

## 2012-12-20 ENCOUNTER — Telehealth: Payer: Self-pay | Admitting: Nurse Practitioner

## 2012-12-20 MED ORDER — ROSUVASTATIN CALCIUM 20 MG PO TABS: 20.0000 mg | ORAL_TABLET | Freq: Every day | ORAL | Status: DC

## 2012-12-23 ENCOUNTER — Other Ambulatory Visit: Payer: Self-pay | Admitting: *Deleted

## 2012-12-23 ENCOUNTER — Encounter: Payer: Self-pay | Admitting: *Deleted

## 2012-12-23 ENCOUNTER — Ambulatory Visit: Payer: BC Managed Care – PPO | Admitting: General Practice

## 2012-12-23 ENCOUNTER — Ambulatory Visit (INDEPENDENT_AMBULATORY_CARE_PROVIDER_SITE_OTHER): Payer: BC Managed Care – PPO | Admitting: Physician Assistant

## 2012-12-23 VITALS — BP 118/62 | HR 64 | Temp 99.3°F | Ht 68.0 in | Wt 252.0 lb

## 2012-12-23 DIAGNOSIS — B370 Candidal stomatitis: Secondary | ICD-10-CM

## 2012-12-23 MED ORDER — FLUCONAZOLE 100 MG PO TABS: 100.0000 mg | ORAL_TABLET | Freq: Every day | ORAL | Status: AC

## 2012-12-23 MED ORDER — MAGIC MOUTHWASH: 10.0000 mL | Freq: Four times a day (QID) | ORAL | Status: DC

## 2012-12-23 NOTE — Telephone Encounter (Signed)
Pt has appt 12/23/12

## 2012-12-23 NOTE — Progress Notes (Signed)
  Subjective:    Patient ID: Candace Rodriguez, female    DOB: [birth year], 47 y.o.   MRN: [id number]  HPI47 y/o female presents with white patches on tongue and lower lip, in addition to lower lip edema. She states that she had gastric surgery on June 24th with administration of IV abx. Shortly after, she had similar symptoms that resolved with magic mouthwash. New medications started include 2 doses of Flexiril and Calcium. Last Monday she took prophylactic Clindamycin for a dental procedure and on Tuesday, mouth symptoms of burning, soreness and lower lip edema recurred.     Review of Systems  Constitutional: Negative.   HENT: Positive for mouth sores (whitish plaques on tongue and lower lip w/ lower lip edema).   Eyes: Negative.   Genitourinary: Negative.   Neurological: Negative.        Objective:   Physical Exam  Constitutional: She appears well-developed and well-nourished.  HENT:  Head: Normocephalic and atraumatic.  Mouth/Throat: Uvula is midline. Posterior oropharyngeal erythema present.    Lower lip edema with erythema and whitish papules and patches. Creamy white plaques adhering to oral mucosa and tongue. Red lesions on palate and posterior pharynx.   Pulmonary/Chest: Effort normal and breath sounds normal. No respiratory distress. She has no wheezes. She has no rales. She exhibits no tenderness.          Assessment & Plan:  1. Oral Candidiasis: Clinical presentation and patient's history of recent IV and PO antibiotic use suggest overgrowth of candida yeast. At this time, I do not feel that it is related to medication changes. I will treat with Diflucan 200 mg on day 1, then 100mg  for 14 days in addition to Magic Mouthwash up to 4 times daily. Also suggested not taking any new medications or adding medications that she has eliminated until problem is resolved. She should RTC for reassessment  if s/s persist or do not improve with treatment.  2. Patient also had recent  bug bite on LE but it appears to be healing. I do not feel that she needs an antibiotic at this time and we discussed the problems that another antibiotic could cause regarding overgrowth of yeast. Patient's main concern at this time is treatment of chief complaint.

## 2012-12-23 NOTE — Patient Instructions (Signed)
Thrush, Adult   Thrush is a yeast infection that develops in the mouth and throat and on the tongue. The medical term for this is oropharyngeal candidiasis, or OPC. Thrush is most common in older adults, but it can occur at any age. Thrush occurs when a yeast called candida grows out of control. Candida normally is present in small amounts in the mouth and on other mucous membranes. However, under certain circumstances, candida can grow rapidly, causing thrush. Thrush can be a recurring problem for people who have chronic illnesses or who take medications that limit the body's ability to fight infection (weakened immune system). Since these people have difficulty fighting infections, the fungus that causes thrush can spread throughout the body. This can cause life-threatening blood or organ infections.  CAUSES   Candida, the yeast that causes thrush, is normally present in small amounts in the mouth and on other mucous membranes. It usually causes no harm. However, when conditions are present that allow the yeast to grow uncontrolled, it invades surrounding tissues and becomes an infection. Thrush is most commonly caused by the yeast Candida albicans. Less often, other forms of candida can lead to thrush.  There are many types of bacteria in your mouth that normally control the growth of candida. Sometimes a new type of bacteria gets into your mouth and disrupts the balance of the germs already there. This can allow candida to overgrow. Other factors that increase your risk of developing thrush include:   An impaired ability to fight infection (weakened immune system). A normal immune system is usually strong enough to prevent candida from overgrowing.   Older adults are more likely to develop thrush because they may have weaker immune systems.   People with human immunodeficiency virus (HIV) infection have a high likelihood of developing thrush. About 90% of people with HIV develop thrush at some point during  the course of their disease.   People with diabetes are more likely to get thrush because high blood sugar levels promote overgrowth of the candida fungus.   A dry mouth (xerostomia). Dry mouth can result from overuse of mouthwashes or from certain conditions such as Sjgren's syndrome.   Pregnancy. Hormone changes during pregnancy can lead to thrush by altering the balance of bacteria in the mouth.   Poor dental care, especially in people who have false teeth.   The use of antibiotic medications. This may lead to thrush by changing the balance of bacteria in the mouth.  SYMPTOMS   Thrush can be a mild infection that causes no symptoms. If symptoms develop, they may include the following:   A burning feeling in the mouth and throat. This can occur at the start of a thrush infection.   White patches that adhere to the mouth and tongue. The tissue around the patches may be red, raw, and painful. If rubbed (during tooth brushing, for example), the patches and the tissue of the mouth may bleed easily.   A bad taste in the mouth or difficulty tasting foods.   Cottony feeling in the mouth.   Sometimes pain during eating and swallowing.  DIAGNOSIS   Your caregiver can usually diagnose thrush by exam. In addition to looking in your mouth, your caregiver will ask you questions about your health.  TREATMENT   Medications that help prevent the growth of fungi (antifungals) are the standard treatment for thrush. These medications are either applied directly to the affected area (topical) or swallowed (oral).  Mild thrush  In   adults, mild cases of thrush may clear up with simple treatment that can be done at home. This treatment usually involves using an antifungal mouth rinse or lozenges. Treatment usually lasts about 14 days.  Moderate to severe thrush   More severe thrush infections that have spread to the esophagus are treated with an oral antifungal medication. A topical antifungal medication may also be  used.   For some severe infections, a treatment period longer than 14 days may be needed.   Oral antifungal medications are almost never used during pregnancy because the fetus may be harmed. However, if a pregnant woman has a rare, severe thrush infection that has spread to her blood, oral antifungal medications may be used. In this case, the risk of harm to the mother and fetus from the severe thrush infection may be greater than the risk posed by the use of antifungal medications.  Persistent or recurrent thrush  Persistent (does not go away) or recurrent (keeps coming back) cases of thrush may:   Need to be treated twice as long as the symptoms last.   Require treatment with both oral and topical antifungal medications.   People with weakened immune systems can take an antifungal medication on a continuous basis to prevent thrush infections.  It is important to treat conditions that make you more likely to get thrush, such as diabetes, human immunodeficiency virus (HIV), or cancer.   HOME CARE INSTRUCTIONS    If you are breast-feeding, you should clean your nipples with an antifungal medication, such as nystatin (Mycostatin). Dry your nipples after breast-feeding. Applying lanolin-containing body lotion may help relieve nipple soreness.   If you wear dentures and get thrush, remove dentures before going to bed, brush them vigorously, and soak in a solution of chlorhexidine gluconate or a product such as Polident or Efferdent.   Eating plain, unflavored yogurt that contains live cultures (check the label) can also help cure thrush. Yogurt helps healthy bacteria grow in the mouth. These bacteria stop the growth of the yeast that causes thrush.   Adults can treat thrush at home with gentian violet (1%), a dye that kills bacteria and fungi. It is available without a prescription. If there is no known cause for the infection or if gentian violet does not cure the thrush, you need to see your  caregiver.  Comfort measures  Measures can be taken to reduce the discomfort of thrush:   Drink cold liquids such as water or iced tea. Eat flavored ice treats or frozen juices.   Eat foods that are easy to swallow such as gelatin, ice cream, or custard.   If the patches are painful, try drinking from a straw.   Rinse your mouth several times a day with a warm saltwater rinse. You can make the saltwater mixture with 1 tsp (5 g) of salt in 8 fl oz (0.2 L) of warm water.  PROGNOSIS    Most cases of thrush are mild and clear up with the use of an antifungal mouth rinse or lozenges. Very mild cases of thrush may clear up without medical treatment. It usually takes about 14 days of treatment with an oral antifungal medication to cure more severe thrush infections. In some cases, thrush may last several weeks even with treatment.   If thrush goes untreated and does not go away by itself, it can spread to other parts of the body.   Thrush can spread to the throat, the vagina, or the skin. It rarely spreads   to other organs of the body.  Thrush is more likely to recur (come back) in:   People who use inhaled corticosteroids to treat asthma.   People who take antibiotic medications for a long time.   People who have false teeth.   People who have a weakened immune system.  RISKS AND COMPLICATIONS  Complications related to thrush are rare in healthy people.  There are several factors that can increase your risk of developing thrush.  Age  Older adults, especially those who have serious health problems, are more likely to develop thrush because their immune systems are likely to be weaker.  Behavior   The yeast that causes thrush can be spread by oral sex.   Heavy smoking can lower the body's ability to fight off infections. This makes thrush more likely to develop.  Other conditions   False teeth (dentures), braces, or a retainer that irritates the mouth make it hard to keep the mouth clean. An unclean mouth is  more likely to develop thrush than a clean mouth.   People with a weakened immune system, such as those who have diabetes or human immunodeficiency virus (HIV) or who are undergoing chemotherapy, have an increased risk for developing thrush.  Medications  Some medications can allow the fungus that causes thrush to grow uncontrolled. Common ones are:   Antibiotics, especially those that kill a wide range of organisms (broad-spectrum antibiotics), such as tetracycline commonly can cause thrush.   Birth control pills (oral contraceptives).   Medications that weaken the body's immune system, such as corticosteroids.  Environment  Exposure over time to certain environmental chemicals, such as benzene and pesticides, can weaken the body's immune system. This increases your risk for developing infections, including thrush.  SEEK IMMEDIATE MEDICAL CARE IF:   Your symptoms are getting worse or are not improving within 7 days of starting treatment.   You have symptoms of spreading infection, such as white patches on the skin outside of the mouth.   You are nursing and you have redness and pain in the nipples in spite of home treatment or if you have burning pain in the nipple area when you nurse. Your baby's mouth should also be examined to determine whether thrush is causing your symptoms.  Document Released: 02/05/2004 Document Revised: 08/04/2011 Document Reviewed: 05/17/2008  [employer] Patient Information 2014 [employer].

## 2012-12-30 ENCOUNTER — Ambulatory Visit: Payer: Self-pay | Admitting: General Practice

## 2012-12-31 ENCOUNTER — Ambulatory Visit: Payer: Self-pay | Admitting: General Practice

## 2013-01-04 ENCOUNTER — Ambulatory Visit (INDEPENDENT_AMBULATORY_CARE_PROVIDER_SITE_OTHER): Payer: BC Managed Care – PPO | Admitting: General Practice

## 2013-01-04 ENCOUNTER — Encounter: Payer: Self-pay | Admitting: General Practice

## 2013-01-04 VITALS — BP 127/76 | HR 74 | Temp 98.8°F | Wt 245.0 lb

## 2013-01-04 DIAGNOSIS — R7989 Other specified abnormal findings of blood chemistry: Secondary | ICD-10-CM

## 2013-01-04 DIAGNOSIS — R6889 Other general symptoms and signs: Secondary | ICD-10-CM

## 2013-01-04 DIAGNOSIS — E789 Disorder of lipoprotein metabolism, unspecified: Secondary | ICD-10-CM

## 2013-01-04 DIAGNOSIS — E7889 Other lipoprotein metabolism disorders: Secondary | ICD-10-CM

## 2013-01-04 NOTE — Progress Notes (Signed)
Patient ID: Candace Rodriguez, female   DOB: 25-Feb-1966, 47 y.o.   MRN: [id number]  Patient her to have TSH and NMR drawn

## 2013-01-05 LAB — THYROID PANEL WITH TSH
T4, Total: 11.3 ug/dL (ref 4.5–12.0)
TSH: 0.02 u[IU]/mL — ABNORMAL LOW (ref 0.450–4.500)

## 2013-01-06 LAB — NMR, LIPOPROFILE
Cholesterol: 133 mg/dL (ref <200)
HDL Particle Number: 27.8 umol/L — ABNORMAL LOW (ref >=30.5)
LDL Size: 19.7 nm — ABNORMAL LOW (ref >20.5)
LP-IR Score: 50 — ABNORMAL HIGH (ref <=45)
Triglycerides by NMR: 119 mg/dL (ref <150)

## 2013-01-10 ENCOUNTER — Other Ambulatory Visit: Payer: Self-pay | Admitting: General Practice

## 2013-01-10 ENCOUNTER — Telehealth: Payer: Self-pay | Admitting: General Practice

## 2013-01-10 DIAGNOSIS — E785 Hyperlipidemia, unspecified: Secondary | ICD-10-CM

## 2013-01-10 MED ORDER — EZETIMIBE 10 MG PO TABS: 10.0000 mg | ORAL_TABLET | Freq: Every day | ORAL | Status: DC

## 2013-01-10 NOTE — Telephone Encounter (Signed)
Taken care of

## 2013-01-14 ENCOUNTER — Other Ambulatory Visit: Payer: Self-pay | Admitting: General Practice

## 2013-01-14 DIAGNOSIS — E039 Hypothyroidism, unspecified: Secondary | ICD-10-CM

## 2013-01-14 DIAGNOSIS — R7989 Other specified abnormal findings of blood chemistry: Secondary | ICD-10-CM

## 2013-01-14 MED ORDER — LEVOTHYROXINE SODIUM 137 MCG PO TABS: 150.0000 ug | ORAL_TABLET | Freq: Every day | ORAL | Status: DC

## 2013-02-21 ENCOUNTER — Telehealth: Payer: Self-pay | Admitting: *Deleted

## 2013-02-28 ENCOUNTER — Other Ambulatory Visit: Payer: Self-pay

## 2013-02-28 MED ORDER — LEVOCETIRIZINE DIHYDROCHLORIDE 5 MG PO TABS: 5.0000 mg | ORAL_TABLET | Freq: Every evening | ORAL | Status: DC

## 2013-02-28 MED ORDER — ROPINIROLE HCL 0.5 MG PO TABS: 1.0000 mg | ORAL_TABLET | Freq: Every day | ORAL | Status: DC

## 2013-03-28 ENCOUNTER — Other Ambulatory Visit: Payer: Self-pay | Admitting: *Deleted

## 2013-03-28 NOTE — Telephone Encounter (Signed)
Patient last seen 01-04-13. Please advise

## 2013-03-31 ENCOUNTER — Other Ambulatory Visit: Payer: Self-pay

## 2013-03-31 MED ORDER — ROPINIROLE HCL 0.5 MG PO TABS: 1.0000 mg | ORAL_TABLET | Freq: Every day | ORAL | Status: DC

## 2013-04-05 ENCOUNTER — Other Ambulatory Visit: Payer: BC Managed Care – PPO

## 2013-04-08 ENCOUNTER — Other Ambulatory Visit: Payer: Self-pay | Admitting: *Deleted

## 2013-04-08 NOTE — Telephone Encounter (Signed)
We have seen her and have refilled her meds, but I do not see an A1C since 2012 in her paper chart

## 2013-04-09 ENCOUNTER — Other Ambulatory Visit: Payer: Self-pay | Admitting: General Practice

## 2013-04-09 NOTE — Telephone Encounter (Signed)
Patient needs to make appointment for routine follow up of chronic health conditions and labs

## 2013-04-11 NOTE — Telephone Encounter (Signed)
Left message to schedule appt

## 2013-04-11 NOTE — Telephone Encounter (Signed)
Patient is taking Metformin for PCOS not diabetes. Also she had blood drawn last week but I do not see any orders or results. I'll follow-up with the lab on this.

## 2013-04-14 ENCOUNTER — Other Ambulatory Visit: Payer: Self-pay | Admitting: General Practice

## 2013-04-19 ENCOUNTER — Telehealth: Payer: Self-pay | Admitting: General Practice

## 2013-04-19 NOTE — Telephone Encounter (Signed)
i spoke with April in the lab and her labs were not sent in. Per April she is to come in and have them repeated at no charge patient is going to come in the am for repeat on labs

## 2013-04-20 ENCOUNTER — Other Ambulatory Visit (INDEPENDENT_AMBULATORY_CARE_PROVIDER_SITE_OTHER): Payer: BC Managed Care – PPO

## 2013-04-20 DIAGNOSIS — R6889 Other general symptoms and signs: Secondary | ICD-10-CM

## 2013-04-20 DIAGNOSIS — R7989 Other specified abnormal findings of blood chemistry: Secondary | ICD-10-CM

## 2013-04-20 NOTE — Progress Notes (Signed)
Pt came in for labs only 

## 2013-04-23 LAB — CMP14+EGFR
ALT: 23 IU/L (ref 0–32)
AST: 13 IU/L (ref 0–40)
Albumin/Globulin Ratio: 2.2 (ref 1.1–2.5)
Alkaline Phosphatase: 79 IU/L (ref 39–117)
BUN/Creatinine Ratio: 19 (ref 9–23)
Chloride: 99 mmol/L (ref 97–108)
GFR calc Af Amer: 126 mL/min/{1.73_m2} (ref >59)
Glucose: 75 mg/dL (ref 65–99)
Potassium: 4.6 mmol/L (ref 3.5–5.2)
Sodium: 141 mmol/L (ref 134–144)
Total Bilirubin: 1.1 mg/dL (ref 0.0–1.2)
Total Protein: 6.3 g/dL (ref 6.0–8.5)

## 2013-04-23 LAB — THYROID PANEL WITH TSH
T3 Uptake Ratio: 30 % (ref 24–39)
T4, Total: 8.7 ug/dL (ref 4.5–12.0)
TSH: 0.01 u[IU]/mL — ABNORMAL LOW (ref 0.450–4.500)

## 2013-04-23 LAB — NMR, LIPOPROFILE
Cholesterol: 162 mg/dL (ref <200)
LDL Particle Number: 1626 nmol/L — ABNORMAL HIGH (ref <1000)
LDL Size: 20.9 nm (ref >20.5)
LP-IR Score: 45 (ref <=45)
Small LDL Particle Number: 766 nmol/L — ABNORMAL HIGH (ref <=527)

## 2013-04-25 ENCOUNTER — Ambulatory Visit (INDEPENDENT_AMBULATORY_CARE_PROVIDER_SITE_OTHER): Payer: BC Managed Care – PPO | Admitting: General Practice

## 2013-04-25 ENCOUNTER — Encounter: Payer: Self-pay | Admitting: General Practice

## 2013-04-25 VITALS — BP 121/79 | HR 86 | Temp 98.8°F | Ht 68.0 in | Wt 213.0 lb

## 2013-04-25 DIAGNOSIS — M109 Gout, unspecified: Secondary | ICD-10-CM

## 2013-04-25 DIAGNOSIS — E282 Polycystic ovarian syndrome: Secondary | ICD-10-CM

## 2013-04-25 DIAGNOSIS — G2581 Restless legs syndrome: Secondary | ICD-10-CM

## 2013-04-25 DIAGNOSIS — H9193 Unspecified hearing loss, bilateral: Secondary | ICD-10-CM

## 2013-04-25 DIAGNOSIS — E039 Hypothyroidism, unspecified: Secondary | ICD-10-CM

## 2013-04-25 DIAGNOSIS — H9190 Unspecified hearing loss, unspecified ear: Secondary | ICD-10-CM

## 2013-04-25 NOTE — Patient Instructions (Signed)
Hearing Loss  A hearing loss is sometimes called deafness. Hearing loss may be partial or total.  CAUSES  Hearing loss may be caused by:   Wax in the ear canal.   Infection of the ear canal.   Infection of the middle ear.   Trauma to the ear or surrounding area.   Fluid in the middle ear.   A hole in the eardrum (perforated eardrum).   Exposure to loud sounds or music.   Problems with the hearing nerve.   Certain medications.  Hearing loss without wax, infection, or a history of injury may mean that the nerve is involved. Hearing loss with severe dizziness, nausea and vomiting or ringing in the ear may suggest a hearing nerve irritation or problems in the middle or inner ear. If hearing loss is untreated, there is a greater likelihood for residual or permanent hearing loss.  DIAGNOSIS  A hearing test (audiometry) assesses hearing loss. The audiometry test needs to be performed by a hearing specialist (audiologist).  TREATMENT  Treatment for recent onset of hearing loss may include:   Ear wax removal.   Medications that kill germs (antibiotics).   Cortisone medications.   Prompt follow up with the appropriate specialist.  Return of hearing depends on the cause of your hearing loss, so proper medical follow-up is important. Some hearing loss may not be reversible, and a caregiver should discuss care and treatment options with you.  SEEK MEDICAL CARE IF:    You have a severe headache, dizziness, or changes in vision.   You have new or increased weakness.   You develop repeated vomiting or other serious medical problems.   You have a fever.  Document Released: 05/12/2005 Document Revised: 08/04/2011 Document Reviewed: 09/06/2009  [employer] Patient Information 2014 [employer].

## 2013-04-25 NOTE — Progress Notes (Signed)
   Subjective:    Patient ID: Candace Rodriguez, female    DOB: [birth year], 47 y.o.   MRN: [id number]  HPI Patient presents today for chronic health follow up. She has a history of PCOS, hypertension, Gout, hyperthyroidism and hyperlipidemia. She reports gastric bypass surgery in June 2014. Reports taking prescribed medications as prescribed. Reports feeling like she is in good health, since weight loss, healthy eating, and regular exercise.  Also reports a history of sensorineural hearing loss and feels like hearing has worsened. Would like to be seen by a audiologist.    Review of Systems  Respiratory: Negative for chest tightness and shortness of breath.   Cardiovascular: Negative for chest pain and palpitations.  Gastrointestinal: Negative for nausea, vomiting, abdominal pain, diarrhea, constipation and blood in stool.  Genitourinary: Negative for difficulty urinating.  Neurological: Negative for dizziness, weakness and headaches.       Objective:   Physical Exam  Constitutional: She is oriented to person, place, and time. She appears well-developed and well-nourished.  Cardiovascular: Normal rate, regular rhythm and normal heart sounds.   Pulmonary/Chest: Effort normal and breath sounds normal. No respiratory distress. She exhibits no tenderness.  Neurological: She is alert and oriented to person, place, and time.  Skin: Skin is warm and dry.  Psychiatric: She has a normal mood and affect.          Assessment & Plan:  1. Hearing loss, bilateral  - Ambulatory referral to Audiology  2. Hypothyroidism -discussed lab results and patient would like to leave dose at current level due to recent change in medication  3. Restless leg syndrome   4. PCOS (polycystic ovarian syndrome)   5. Gout Continue all current medications F/u in 6 months Discussed benefits of regular exercise and healthy eating Maintain schedule appointment with specialists Patient verbalized  understanding Coralie Keens, FNP-C

## 2013-04-26 ENCOUNTER — Encounter: Payer: Self-pay | Admitting: *Deleted

## 2013-05-30 ENCOUNTER — Other Ambulatory Visit: Payer: Self-pay

## 2013-05-30 MED ORDER — METFORMIN HCL 1000 MG PO TABS: 1000.0000 mg | ORAL_TABLET | Freq: Every day | ORAL | Status: DC

## 2013-07-11 ENCOUNTER — Other Ambulatory Visit: Payer: Self-pay

## 2013-07-11 MED ORDER — METFORMIN HCL 1000 MG PO TABS: 1000.0000 mg | ORAL_TABLET | Freq: Every day | ORAL | Status: DC

## 2013-07-26 ENCOUNTER — Other Ambulatory Visit: Payer: Self-pay

## 2013-07-26 MED ORDER — ROPINIROLE HCL 1 MG PO TABS: 1.0000 mg | ORAL_TABLET | Freq: Every day | ORAL | Status: DC

## 2013-08-18 ENCOUNTER — Other Ambulatory Visit: Payer: Self-pay | Admitting: Family Medicine

## 2013-08-18 ENCOUNTER — Other Ambulatory Visit: Payer: Self-pay | Admitting: General Practice

## 2013-08-25 ENCOUNTER — Other Ambulatory Visit: Payer: Self-pay | Admitting: Family Medicine

## 2013-10-24 ENCOUNTER — Ambulatory Visit: Payer: BC Managed Care – PPO | Admitting: General Practice

## 2013-10-24 HISTORY — PX: CHOLECYSTECTOMY: SHX55

## 2013-11-02 ENCOUNTER — Other Ambulatory Visit: Payer: Self-pay | Admitting: Nurse Practitioner

## 2013-11-03 NOTE — Telephone Encounter (Signed)
Last seen 04/25/13  Mae  Last glucose 04/20/13

## 2013-11-07 ENCOUNTER — Ambulatory Visit (INDEPENDENT_AMBULATORY_CARE_PROVIDER_SITE_OTHER): Payer: BC Managed Care – PPO | Admitting: Family

## 2013-11-07 ENCOUNTER — Encounter: Payer: Self-pay | Admitting: Family

## 2013-11-07 VITALS — BP 128/76 | HR 68 | Temp 98.4°F | Ht 68.0 in | Wt 201.8 lb

## 2013-11-07 DIAGNOSIS — E282 Polycystic ovarian syndrome: Secondary | ICD-10-CM

## 2013-11-07 DIAGNOSIS — J45909 Unspecified asthma, uncomplicated: Secondary | ICD-10-CM

## 2013-11-07 DIAGNOSIS — E039 Hypothyroidism, unspecified: Secondary | ICD-10-CM

## 2013-11-07 DIAGNOSIS — E785 Hyperlipidemia, unspecified: Secondary | ICD-10-CM

## 2013-11-07 DIAGNOSIS — G2581 Restless legs syndrome: Secondary | ICD-10-CM

## 2013-11-07 MED ORDER — METFORMIN HCL 500 MG PO TABS: ORAL_TABLET | ORAL | Status: DC

## 2013-11-07 MED ORDER — ALBUTEROL SULFATE HFA 108 (90 BASE) MCG/ACT IN AERS: 2.0000 | INHALATION_SPRAY | Freq: Four times a day (QID) | RESPIRATORY_TRACT | Status: DC | PRN

## 2013-11-07 MED ORDER — ROPINIROLE HCL 1 MG PO TABS: 1.0000 mg | ORAL_TABLET | Freq: Every day | ORAL | Status: DC

## 2013-11-07 NOTE — Progress Notes (Signed)
Subjective:    Patient ID: Candace Rodriguez, female    DOB: Dec 09, 1965, 48 y.o.   MRN: [id number]  Hyperlipidemia This is a chronic problem. The current episode started more than 1 year ago. The problem is controlled. Recent lipid tests were reviewed and are normal. Exacerbating diseases include hypothyroidism. She has no history of diabetes. Pertinent negatives include no leg pain. (Pt taking supplements-Pt does not want to be on statins) Current antihyperlipidemic treatment includes herbal therapy. The current treatment provides moderate improvement of lipids. Risk factors for coronary artery disease include dyslipidemia and obesity.  Thyroid Problem Presents for follow-up visit. Symptoms include cold intolerance and fatigue. Patient reports no anxiety, constipation, diarrhea, hair loss or weight gain. The symptoms have been stable. Treatments tried: Nature-Thyroid. Her past medical history is significant for hyperlipidemia. There is no history of diabetes.  Asthma Pt states this is controlled. States she has to use her rescue inhaler about twice year. States the last time "was some time back in the winter". Polycystic Ovarian Syndrome Pt currently taking Metformin 1048m daily. No complaints or concerns. Restless Leg Syndrome Pt currently taking ropinirol 1 mg daily with supplements (Magnesium chelate and magnesium cream). Pt states this is not controlled, but is working with Dr. NMilta Deitersto manage her medications.   *Pt see's Dr. NMilta Deitersmonitors pt's thyroid and changes accordingly. Pt has an appointment with her in July. Pt has had gastric surgery June 2014 and lost 150 lbs since then.   Review of Systems  Constitutional: Positive for fatigue. Negative for weight gain.  Respiratory: Negative.   Cardiovascular: Negative.   Gastrointestinal: Negative.  Negative for diarrhea and constipation.  Endocrine: Positive for cold intolerance.  Genitourinary: Negative.   All other systems reviewed  and are negative.      Objective:   Physical Exam  Vitals reviewed. Constitutional: She is oriented to person, place, and time. She appears well-developed and well-nourished. No distress.  HENT:  Head: Normocephalic and atraumatic.  Right Ear: External ear normal.  Mouth/Throat: Oropharynx is clear and moist.  Eyes: Pupils are equal, round, and reactive to light.  Neck: Normal range of motion. Neck supple. No thyromegaly present.  Cardiovascular: Normal rate, regular rhythm, normal heart sounds and intact distal pulses.   No murmur heard. Pulmonary/Chest: Effort normal and breath sounds normal. No respiratory distress. She has no wheezes.  Abdominal: Soft. Bowel sounds are normal. She exhibits no distension. There is tenderness.  Mild upper epigastric pain- Pt states she is seeing her surgeon Thursday for gallbladder issues   Musculoskeletal: Normal range of motion. She exhibits no edema and no tenderness.  Neurological: She is alert and oriented to person, place, and time. She has normal reflexes. No cranial nerve deficit.  Skin: Skin is warm and dry.  Psychiatric: She has a normal mood and affect. Her behavior is normal. Judgment and thought content normal.    BP 128/76  Pulse 68  Temp(Src) 98.4 F (36.9 C) (Oral)  Ht 5' 8"  (1.727 m)  Wt 201 lb 12.8 oz (91.536 kg)  BMI 30.69 kg/m2       Assessment & Plan:  1. Unspecified hypothyroidism  2. Restless leg syndrome, controlled - rOPINIRole (REQUIP) 1 MG tablet; Take 1 tablet (1 mg total) by mouth daily.  Dispense: 30 tablet; Refill: 11  3. Dyslipidemia - CMP14+EGFR - NMR, lipoprofile  4. Asthma - albuterol (PROVENTIL HFA;VENTOLIN HFA) 108 (90 BASE) MCG/ACT inhaler; Inhale 2 puffs into the lungs every 6 (six) hours as  needed for wheezing.  Dispense: 1 Inhaler; Refill: 4  5. Polycystic ovarian syndrome - metFORMIN (GLUCOPHAGE) 500 MG tablet; TAKE ONE (1) TABLET THREE (3) TIMES EACH DAY  Dispense: 30 tablet; Refill:  11   Continue all meds Labs pending- Pt does not want to be statins and last time labs were drawn pt's ALT's were high.  Will monitor. Health Maintenance reviewed Diet and exercise encouraged RTO 6 months  Evelina Dun, FNP

## 2013-11-07 NOTE — Patient Instructions (Signed)

## 2013-11-08 LAB — CMP14+EGFR
ALT: 71 IU/L — AB (ref 0–32)
AST: 49 IU/L — AB (ref 0–40)
Albumin/Globulin Ratio: 2.5 (ref 1.1–2.5)
Albumin: 4.5 g/dL (ref 3.5–5.5)
Alkaline Phosphatase: 66 IU/L (ref 39–117)
BUN / CREAT RATIO: 15 (ref 9–23)
BUN: 10 mg/dL (ref 6–24)
CHLORIDE: 100 mmol/L (ref 97–108)
CO2: 24 mmol/L (ref 18–29)
Calcium: 10.1 mg/dL (ref 8.7–10.2)
Creatinine, Ser: 0.66 mg/dL (ref 0.57–1.00)
GFR calc Af Amer: 120 mL/min/{1.73_m2} (ref >59)
GFR calc non Af Amer: 104 mL/min/{1.73_m2} (ref >59)
Globulin, Total: 1.8 g/dL (ref 1.5–4.5)
Glucose: 76 mg/dL (ref 65–99)
POTASSIUM: 4.5 mmol/L (ref 3.5–5.2)
SODIUM: 142 mmol/L (ref 134–144)
Total Bilirubin: 2.5 mg/dL — ABNORMAL HIGH (ref 0.0–1.2)
Total Protein: 6.3 g/dL (ref 6.0–8.5)

## 2013-11-08 LAB — NMR, LIPOPROFILE
Cholesterol: 177 mg/dL (ref 100–199)
HDL Cholesterol by NMR: 51 mg/dL (ref >39)
HDL Particle Number: 31.5 umol/L (ref >=30.5)
LDL PARTICLE NUMBER: 1258 nmol/L — AB (ref <1000)
LDL Size: 20.8 nm (ref >20.5)
LDLC SERPL CALC-MCNC: 110 mg/dL — AB (ref 0–99)
Small LDL Particle Number: 528 nmol/L — ABNORMAL HIGH (ref <=527)
Triglycerides by NMR: 79 mg/dL (ref 0–149)

## 2013-12-07 DIAGNOSIS — R1011 Right upper quadrant pain: Secondary | ICD-10-CM | POA: Insufficient documentation

## 2013-12-14 LAB — HM DIABETES EYE EXAM

## 2014-04-11 ENCOUNTER — Ambulatory Visit (INDEPENDENT_AMBULATORY_CARE_PROVIDER_SITE_OTHER): Payer: BC Managed Care – PPO | Admitting: Family Medicine

## 2014-04-11 ENCOUNTER — Encounter: Payer: Self-pay | Admitting: Family Medicine

## 2014-04-11 ENCOUNTER — Telehealth: Payer: Self-pay | Admitting: Family Medicine

## 2014-04-11 VITALS — BP 135/81 | HR 116 | Temp 98.7°F | Ht 68.0 in | Wt 205.2 lb

## 2014-04-11 DIAGNOSIS — R300 Dysuria: Secondary | ICD-10-CM

## 2014-04-11 DIAGNOSIS — N390 Urinary tract infection, site not specified: Secondary | ICD-10-CM

## 2014-04-11 LAB — POCT URINALYSIS DIPSTICK
Bilirubin, UA: NEGATIVE
Glucose, UA: NEGATIVE
Ketones, UA: NEGATIVE
Nitrite, UA: NEGATIVE
Spec Grav, UA: <=1.005
Urobilinogen, UA: NEGATIVE
pH, UA: 6.5

## 2014-04-11 LAB — POCT UA - MICROSCOPIC ONLY
Casts, Ur, LPF, POC: NEGATIVE
Crystals, Ur, HPF, POC: NEGATIVE
Mucus, UA: NEGATIVE
Yeast, UA: NEGATIVE

## 2014-04-11 MED ORDER — SULFAMETHOXAZOLE-TRIMETHOPRIM 800-160 MG PO TABS: 1.0000 | ORAL_TABLET | Freq: Two times a day (BID) | ORAL | Status: DC

## 2014-04-11 NOTE — Telephone Encounter (Signed)
Pt given appt today @ 5:45 with bill oxford

## 2014-04-11 NOTE — Progress Notes (Signed)
   Subjective:    Patient ID: Candace Rodriguez, female    DOB: [birth year], 48 y.o.   MRN: [id number]  HPI C/o dysuria  Review of Systems  Constitutional: Negative for fever.  HENT: Negative for ear pain.   Eyes: Negative for discharge.  Respiratory: Negative for cough.   Cardiovascular: Negative for chest pain.  Gastrointestinal: Negative for abdominal distention.  Endocrine: Negative for polyuria.  Genitourinary: Negative for difficulty urinating.  Musculoskeletal: Negative for gait problem and neck pain.  Skin: Negative for color change and rash.  Neurological: Negative for speech difficulty and headaches.  Psychiatric/Behavioral: Negative for agitation.       Objective:    BP 135/81 mmHg  Pulse 116  Temp(Src) 98.7 F (37.1 C) (Oral)  Ht 5\' 8"  (1.727 m)  Wt 205 lb 3.2 oz (93.078 kg)  BMI 31.21 kg/m2 Physical Exam  Constitutional: She is oriented to person, place, and time. She appears well-developed and well-nourished.  HENT:  Head: Normocephalic and atraumatic.  Mouth/Throat: Oropharynx is clear and moist.  Eyes: Pupils are equal, round, and reactive to light.  Neck: Normal range of motion. Neck supple.  Cardiovascular: Normal rate and regular rhythm.   No murmur heard. Pulmonary/Chest: Effort normal and breath sounds normal.  Abdominal: Soft. Bowel sounds are normal. There is no tenderness.  Neurological: She is alert and oriented to person, place, and time.  Skin: Skin is warm and dry.  Psychiatric: She has a normal mood and affect.          Assessment & Plan:     ICD-9-CM ICD-10-CM   1. Dysuria 788.1 R30.0 POCT urinalysis dipstick     POCT UA - Microscopic Only     Urine culture     sulfamethoxazole-trimethoprim (BACTRIM DS,SEPTRA DS) 800-160 MG per tablet  2. Urinary tract infection without hematuria, site unspecified 599.0 N39.0 Urine culture     sulfamethoxazole-trimethoprim (BACTRIM DS,SEPTRA DS) 800-160 MG per tablet     Return if symptoms  worsen or fail to improve.  Lysbeth Penner FNP

## 2014-04-14 LAB — URINE CULTURE

## 2014-04-26 ENCOUNTER — Telehealth: Payer: Self-pay | Admitting: Family Medicine

## 2014-04-27 ENCOUNTER — Other Ambulatory Visit: Payer: Self-pay | Admitting: Family Medicine

## 2014-04-27 MED ORDER — CEPHALEXIN 500 MG PO CAPS: 500.0000 mg | ORAL_CAPSULE | Freq: Four times a day (QID) | ORAL | Status: DC

## 2014-04-27 NOTE — Telephone Encounter (Signed)
Her UA cx shows beta hemolytic streptococcus which responds well to PCN or to Cephlasporins.  She has allergy to PCN and would recommend Keflex if she has taken this in past and tolerates.

## 2014-04-27 NOTE — Telephone Encounter (Signed)
Please review

## 2014-04-27 NOTE — Telephone Encounter (Signed)
Pt thinks she has taken Keflex before and did ok with it. Uses [facility].

## 2014-05-15 ENCOUNTER — Ambulatory Visit: Payer: BC Managed Care – PPO | Admitting: Family

## 2014-05-15 ENCOUNTER — Encounter: Payer: Self-pay | Admitting: Nurse Practitioner

## 2014-05-15 ENCOUNTER — Ambulatory Visit (INDEPENDENT_AMBULATORY_CARE_PROVIDER_SITE_OTHER): Payer: BC Managed Care – PPO | Admitting: Nurse Practitioner

## 2014-05-15 VITALS — BP 157/80 | HR 132 | Temp 102.0°F | Ht 68.0 in | Wt 208.0 lb

## 2014-05-15 DIAGNOSIS — N10 Acute tubulo-interstitial nephritis: Secondary | ICD-10-CM

## 2014-05-15 DIAGNOSIS — R000 Tachycardia, unspecified: Secondary | ICD-10-CM

## 2014-05-15 DIAGNOSIS — R0981 Nasal congestion: Secondary | ICD-10-CM

## 2014-05-15 DIAGNOSIS — R300 Dysuria: Secondary | ICD-10-CM

## 2014-05-15 DIAGNOSIS — R509 Fever, unspecified: Secondary | ICD-10-CM

## 2014-05-15 DIAGNOSIS — R609 Edema, unspecified: Secondary | ICD-10-CM

## 2014-05-15 LAB — POCT URINALYSIS DIPSTICK
Bilirubin, UA: NEGATIVE
Blood, UA: NEGATIVE
GLUCOSE UA: NEGATIVE
KETONES UA: NEGATIVE
Leukocytes, UA: NEGATIVE
Nitrite, UA: NEGATIVE
Protein, UA: NEGATIVE
SPEC GRAV UA: 1.01
Urobilinogen, UA: NEGATIVE
pH, UA: 7

## 2014-05-15 LAB — POCT UA - MICROSCOPIC ONLY
Casts, Ur, LPF, POC: NEGATIVE
Crystals, Ur, HPF, POC: NEGATIVE
Mucus, UA: NEGATIVE
RBC, URINE, MICROSCOPIC: NEGATIVE
WBC, Ur, HPF, POC: NEGATIVE
Yeast, UA: NEGATIVE

## 2014-05-15 LAB — POCT INFLUENZA A/B
INFLUENZA A, POC: NEGATIVE
INFLUENZA B, POC: NEGATIVE

## 2014-05-15 MED ORDER — CEFTRIAXONE SODIUM 1 G IJ SOLR
1.0000 g | INTRAMUSCULAR | Status: AC
  Administered 2014-05-15: 1 g via INTRAMUSCULAR

## 2014-05-15 MED ORDER — FUROSEMIDE 20 MG PO TABS: 20.0000 mg | ORAL_TABLET | Freq: Every day | ORAL | Status: DC

## 2014-05-15 NOTE — Progress Notes (Signed)
Subjective:    Patient ID: Candace Rodriguez, female    DOB: 01-May-1966, 48 y.o.   MRN: [id number]  HPI Patient in today c/o dysuria and frequency- started in October- has been on 3 rounds of antibiotics and just finished antibiotic 1 week ago and symptoms started coming back on Wednesday. She also is c/o fever and achy all over that started this morning- denies any cough or congestion.    Review of Systems  Constitutional: Positive for fever.  HENT: Positive for congestion and rhinorrhea.   Respiratory: Positive for cough.   Genitourinary: Positive for dysuria, urgency and frequency.  Neurological: Negative.   Psychiatric/Behavioral: Negative.   All other systems reviewed and are negative.      Objective:   Physical Exam  Constitutional: She is oriented to person, place, and time. She appears well-developed and well-nourished. No distress.  HENT:  Right Ear: Hearing, tympanic membrane, external ear and ear canal normal.  Left Ear: Hearing, tympanic membrane, external ear and ear canal normal.  Nose: Mucosal edema and rhinorrhea present. Right sinus exhibits no maxillary sinus tenderness and no frontal sinus tenderness. Left sinus exhibits no maxillary sinus tenderness and no frontal sinus tenderness.  Mouth/Throat: Uvula is midline, oropharynx is clear and moist and mucous membranes are normal.  Eyes: Conjunctivae are normal. Pupils are equal, round, and reactive to light.  Neck: Normal range of motion. Neck supple.  Cardiovascular: Normal rate and normal heart sounds.   Pulmonary/Chest: Effort normal and breath sounds normal.  Abdominal: Soft. Bowel sounds are normal.  Lymphadenopathy:    She has no cervical adenopathy.  Neurological: She is alert and oriented to person, place, and time.  Skin: Skin is warm and dry.  Psychiatric: She has a normal mood and affect. Her behavior is normal. Judgment and thought content normal.   BP 157/80 mmHg  Pulse 132  Temp(Src) 102 F  (38.9 C) (Oral)  Ht 5\' 8"  (1.727 m)  Wt 208 lb (94.348 kg)  BMI 31.63 kg/m2  Results for orders placed or performed in visit on 04/11/14  Urine culture  Result Value Ref Range   Urine Culture, Routine Final report (A)    Result 1 Comment (A)   POCT urinalysis dipstick  Result Value Ref Range   Color, UA gold    Clarity, UA clear    Glucose, UA negative    Bilirubin, UA negative    Ketones, UA negative    Spec Grav, UA <=1.005    Blood, UA trace    pH, UA 6.5    Protein, UA trace    Urobilinogen, UA negative    Nitrite, UA negative    Leukocytes, UA large (3+)   POCT UA - Microscopic Only  Result Value Ref Range   WBC, Ur, HPF, POC 10-20    RBC, urine, microscopic 5-6    Bacteria, U Microscopic few    Mucus, UA negative    Epithelial cells, urine per micros occ    Crystals, Ur, HPF, POC negative    Casts, Ur, LPF, POC negative    Yeast, UA negative     EKG- sinus Merideth Abbey, FNP       Assessment & Plan:   1. Dysuria - POCT UA - Microscopic Only - POCT urinalysis dipstick  2. Acute pyelonephritis Force fluids - cefTRIAXone (ROCEPHIN) injection 1 g; Inject 1 g into the muscle now. - Urine culture - Ambulatory referral to Urology  3. Congestion of nasal sinus - POCT Influenza  A/B  4. Tachycardia Avoid caffeine RTO if does not stop - EKG 12-Lead  5. Peripheral edema -lasix 20 mg 1 po qd  RTO prn  Mary-Margaret Hassell Done, FNP

## 2014-05-16 LAB — CMP14+EGFR
ALBUMIN: 4.3 g/dL (ref 3.5–5.5)
ALT: 31 IU/L (ref 0–32)
AST: 18 IU/L (ref 0–40)
Albumin/Globulin Ratio: 2 (ref 1.1–2.5)
Alkaline Phosphatase: 83 IU/L (ref 39–117)
BUN/Creatinine Ratio: 33 — ABNORMAL HIGH (ref 9–23)
BUN: 14 mg/dL (ref 6–24)
CO2: 27 mmol/L (ref 18–29)
Calcium: 9.5 mg/dL (ref 8.7–10.2)
Chloride: 98 mmol/L (ref 97–108)
Creatinine, Ser: 0.42 mg/dL — ABNORMAL LOW (ref 0.57–1.00)
GFR calc Af Amer: 139 mL/min/{1.73_m2} (ref >59)
GFR, EST NON AFRICAN AMERICAN: 121 mL/min/{1.73_m2} (ref >59)
GLUCOSE: 108 mg/dL — AB (ref 65–99)
Globulin, Total: 2.2 g/dL (ref 1.5–4.5)
Potassium: 3.9 mmol/L (ref 3.5–5.2)
Sodium: 140 mmol/L (ref 134–144)
TOTAL PROTEIN: 6.5 g/dL (ref 6.0–8.5)
Total Bilirubin: 1 mg/dL (ref 0.0–1.2)

## 2014-05-17 LAB — URINE CULTURE

## 2014-05-18 ENCOUNTER — Telehealth: Payer: Self-pay | Admitting: *Deleted

## 2014-05-18 ENCOUNTER — Other Ambulatory Visit: Payer: Self-pay | Admitting: Nurse Practitioner

## 2014-05-18 MED ORDER — CLINDAMYCIN HCL 300 MG PO CAPS: 300.0000 mg | ORAL_CAPSULE | Freq: Three times a day (TID) | ORAL | Status: DC

## 2014-05-18 NOTE — Telephone Encounter (Signed)
-----   Message from Chevis Pretty, [place] sent at 05/18/2014 11:13 AM EST ----- Urine strep B- amoxicillin best trreatment - but patient allergic- is patient still having symptoms?

## 2014-05-18 NOTE — Telephone Encounter (Signed)
Pt is still having some symptoms, nausea and low grade fever, some dysuria and lower abd pain

## 2014-06-12 ENCOUNTER — Encounter: Payer: Self-pay | Admitting: Family

## 2014-06-12 ENCOUNTER — Ambulatory Visit (INDEPENDENT_AMBULATORY_CARE_PROVIDER_SITE_OTHER): Payer: BC Managed Care – PPO | Admitting: Family

## 2014-06-12 VITALS — BP 113/78 | HR 93 | Temp 99.1°F | Ht 68.0 in | Wt 198.2 lb

## 2014-06-12 DIAGNOSIS — R600 Localized edema: Secondary | ICD-10-CM | POA: Insufficient documentation

## 2014-06-12 DIAGNOSIS — E282 Polycystic ovarian syndrome: Secondary | ICD-10-CM

## 2014-06-12 DIAGNOSIS — E559 Vitamin D deficiency, unspecified: Secondary | ICD-10-CM

## 2014-06-12 DIAGNOSIS — R609 Edema, unspecified: Secondary | ICD-10-CM

## 2014-06-12 DIAGNOSIS — I10 Essential (primary) hypertension: Secondary | ICD-10-CM

## 2014-06-12 DIAGNOSIS — R300 Dysuria: Secondary | ICD-10-CM

## 2014-06-12 DIAGNOSIS — G2581 Restless legs syndrome: Secondary | ICD-10-CM

## 2014-06-12 DIAGNOSIS — E039 Hypothyroidism, unspecified: Secondary | ICD-10-CM

## 2014-06-12 DIAGNOSIS — E785 Hyperlipidemia, unspecified: Secondary | ICD-10-CM

## 2014-06-12 LAB — POCT URINALYSIS DIPSTICK
BILIRUBIN UA: NEGATIVE
Blood, UA: NEGATIVE
GLUCOSE UA: NEGATIVE
KETONES UA: NEGATIVE
Leukocytes, UA: NEGATIVE
Nitrite, UA: NEGATIVE
Protein, UA: NEGATIVE
Spec Grav, UA: <=1.005
UROBILINOGEN UA: NEGATIVE
pH, UA: 5

## 2014-06-12 LAB — POCT UA - MICROSCOPIC ONLY
CRYSTALS, UR, HPF, POC: NEGATIVE
Casts, Ur, LPF, POC: NEGATIVE
Mucus, UA: NEGATIVE
RBC, urine, microscopic: NEGATIVE
YEAST UA: NEGATIVE

## 2014-06-12 MED ORDER — ROPINIROLE HCL 1 MG PO TABS: 1.0000 mg | ORAL_TABLET | Freq: Every day | ORAL | Status: DC

## 2014-06-12 MED ORDER — METFORMIN HCL 1000 MG PO TABS: 1000.0000 mg | ORAL_TABLET | Freq: Every day | ORAL | Status: DC

## 2014-06-12 NOTE — Patient Instructions (Signed)
Health Maintenance Adopting a healthy lifestyle and getting preventive care can go a long way to promote health and wellness. Talk with your health care provider about what schedule of regular examinations is right for you. This is a good chance for you to check in with your provider about disease prevention and staying healthy. In between checkups, there are plenty of things you can do on your own. Experts have done a lot of research about which lifestyle changes and preventive measures are most likely to keep you healthy. Ask your health care provider for more information. WEIGHT AND DIET  Eat a healthy diet  Be sure to include plenty of vegetables, fruits, low-fat dairy products, and lean protein.  Do not eat a lot of foods high in solid fats, added sugars, or salt.  Get regular exercise. This is one of the most important things you can do for your health.  Most adults should exercise for at least 150 minutes each week. The exercise should increase your heart rate and make you sweat (moderate-intensity exercise).  Most adults should also do strengthening exercises at least twice a week. This is in addition to the moderate-intensity exercise.  Maintain a healthy weight  Body mass index (BMI) is a measurement that can be used to identify possible weight problems. It estimates body fat based on height and weight. Your health care provider can help determine your BMI and help you achieve or maintain a healthy weight.  For females 25 years of age and older:   A BMI below 18.5 is considered underweight.  A BMI of 18.5 to 24.9 is normal.  A BMI of 25 to 29.9 is considered overweight.  A BMI of 30 and above is considered obese.  Watch levels of cholesterol and blood lipids  You should start having your blood tested for lipids and cholesterol at 50 years of age, then have this test every 5 years.  You may need to have your cholesterol levels checked more often if:  Your lipid or  cholesterol levels are high.  You are older than 50 years of age.  You are at high risk for heart disease.  CANCER SCREENING   Lung Cancer  Lung cancer screening is recommended for adults 97-92 years old who are at high risk for lung cancer because of a history of smoking.  A yearly low-dose CT scan of the lungs is recommended for people who:  Currently smoke.  Have quit within the past 15 years.  Have at least a 30-pack-year history of smoking. A pack year is smoking an average of one pack of cigarettes a day for 1 year.  Yearly screening should continue until it has been 15 years since you quit.  Yearly screening should stop if you develop a health problem that would prevent you from having lung cancer treatment.  Breast Cancer  Practice breast self-awareness. This means understanding how your breasts normally appear and feel.  It also means doing regular breast self-exams. Let your health care provider know about any changes, no matter how small.  If you are in your 20s or 30s, you should have a clinical breast exam (CBE) by a health care provider every 1-3 years as part of a regular health exam.  If you are 76 or older, have a CBE every year. Also consider having a breast X-ray (mammogram) every year.  If you have a family history of breast cancer, talk to your health care provider about genetic screening.  If you are  at high risk for breast cancer, talk to your health care provider about having an MRI and a mammogram every year.  Breast cancer gene (BRCA) assessment is recommended for women who have family members with BRCA-related cancers. BRCA-related cancers include:  Breast.  Ovarian.  Tubal.  Peritoneal cancers.  Results of the assessment will determine the need for genetic counseling and BRCA1 and BRCA2 testing. Cervical Cancer Routine pelvic examinations to screen for cervical cancer are no longer recommended for nonpregnant women who are considered low  risk for cancer of the pelvic organs (ovaries, uterus, and vagina) and who do not have symptoms. A pelvic examination may be necessary if you have symptoms including those associated with pelvic infections. Ask your health care provider if a screening pelvic exam is right for you.   The Pap test is the screening test for cervical cancer for women who are considered at risk.  If you had a hysterectomy for a problem that was not cancer or a condition that could lead to cancer, then you no longer need Pap tests.  If you are older than 65 years, and you have had normal Pap tests for the past 10 years, you no longer need to have Pap tests.  If you have had past treatment for cervical cancer or a condition that could lead to cancer, you need Pap tests and screening for cancer for at least 20 years after your treatment.  If you no longer get a Pap test, assess your risk factors if they change (such as having a new sexual partner). This can affect whether you should start being screened again.  Some women have medical problems that increase their chance of getting cervical cancer. If this is the case for you, your health care provider may recommend more frequent screening and Pap tests.  The human papillomavirus (HPV) test is another test that may be used for cervical cancer screening. The HPV test looks for the virus that can cause cell changes in the cervix. The cells collected during the Pap test can be tested for HPV.  The HPV test can be used to screen women 30 years of age and older. Getting tested for HPV can extend the interval between normal Pap tests from three to five years.  An HPV test also should be used to screen women of any age who have unclear Pap test results.  After 50 years of age, women should have HPV testing as often as Pap tests.  Colorectal Cancer  This type of cancer can be detected and often prevented.  Routine colorectal cancer screening usually begins at 50 years of  age and continues through 50 years of age.  Your health care provider may recommend screening at an earlier age if you have risk factors for colon cancer.  Your health care provider may also recommend using home test kits to check for hidden blood in the stool.  A small camera at the end of a tube can be used to examine your colon directly (sigmoidoscopy or colonoscopy). This is done to check for the earliest forms of colorectal cancer.  Routine screening usually begins at age 50.  Direct examination of the colon should be repeated every 5-10 years through 50 years of age. However, you may need to be screened more often if early forms of precancerous polyps or small growths are found. Skin Cancer  Check your skin from head to toe regularly.  Tell your health care provider about any new moles or changes in   moles, especially if there is a change in a mole's shape or color.  Also tell your health care provider if you have a mole that is larger than the size of a pencil eraser.  Always use sunscreen. Apply sunscreen liberally and repeatedly throughout the day.  Protect yourself by wearing long sleeves, pants, a wide-brimmed hat, and sunglasses whenever you are outside. HEART DISEASE, DIABETES, AND HIGH BLOOD PRESSURE   Have your blood pressure checked at least every 1-2 years. High blood pressure causes heart disease and increases the risk of stroke.  If you are between 75 years and 42 years old, ask your health care provider if you should take aspirin to prevent strokes.  Have regular diabetes screenings. This involves taking a blood sample to check your fasting blood sugar level.  If you are at a normal weight and have a low risk for diabetes, have this test once every three years after 50 years of age.  If you are overweight and have a high risk for diabetes, consider being tested at a younger age or more often. PREVENTING INFECTION  Hepatitis B  If you have a higher risk for  hepatitis B, you should be screened for this virus. You are considered at high risk for hepatitis B if:  You were born in a country where hepatitis B is common. Ask your health care provider which countries are considered high risk.  Your parents were born in a high-risk country, and you have not been immunized against hepatitis B (hepatitis B vaccine).  You have HIV or AIDS.  You use needles to inject street drugs.  You live with someone who has hepatitis B.  You have had sex with someone who has hepatitis B.  You get hemodialysis treatment.  You take certain medicines for conditions, including cancer, organ transplantation, and autoimmune conditions. Hepatitis C  Blood testing is recommended for:  Everyone born from [birth year] through 1966.  Anyone with known risk factors for hepatitis C. Sexually transmitted infections (STIs)  You should be screened for sexually transmitted infections (STIs) including gonorrhea and chlamydia if:  You are sexually active and are younger than 50 years of age.  You are older than 50 years of age and your health care provider tells you that you are at risk for this type of infection.  Your sexual activity has changed since you were last screened and you are at an increased risk for chlamydia or gonorrhea. Ask your health care provider if you are at risk.  If you do not have HIV, but are at risk, it may be recommended that you take a prescription medicine daily to prevent HIV infection. This is called pre-exposure prophylaxis (PrEP). You are considered at risk if:  You are sexually active and do not regularly use condoms or know the HIV status of your partner(s).  You take drugs by injection.  You are sexually active with a partner who has HIV. Talk with your health care provider about whether you are at high risk of being infected with HIV. If you choose to begin PrEP, you should first be tested for HIV. You should then be tested every 3 months for  as long as you are taking PrEP.  PREGNANCY   If you are premenopausal and you may become pregnant, ask your health care provider about preconception counseling.  If you may become pregnant, take 400 to 800 micrograms (mcg) of folic acid every day.  If you want to prevent pregnancy, talk to your  health care provider about birth control (contraception). OSTEOPOROSIS AND MENOPAUSE   Osteoporosis is a disease in which the bones lose minerals and strength with aging. This can result in serious bone fractures. Your risk for osteoporosis can be identified using a bone density scan.  If you are 45 years of age or older, or if you are at risk for osteoporosis and fractures, ask your health care provider if you should be screened.  Ask your health care provider whether you should take a calcium or vitamin D supplement to lower your risk for osteoporosis.  Menopause may have certain physical symptoms and risks.  Hormone replacement therapy may reduce some of these symptoms and risks. Talk to your health care provider about whether hormone replacement therapy is right for you.  HOME CARE INSTRUCTIONS   Schedule regular health, dental, and eye exams.  Stay current with your immunizations.   Do not use any tobacco products including cigarettes, chewing tobacco, or electronic cigarettes.  If you are pregnant, do not drink alcohol.  If you are breastfeeding, limit how much and how often you drink alcohol.  Limit alcohol intake to no more than 1 drink per day for nonpregnant women. One drink equals 12 ounces of beer, 5 ounces of wine, or 1 ounces of hard liquor.  Do not use street drugs.  Do not share needles.  Ask your health care provider for help if you need support or information about quitting drugs.  Tell your health care provider if you often feel depressed.  Tell your health care provider if you have ever been abused or do not feel safe at home. Document Released: 11/25/2010  Document Revised: 09/26/2013 Document Reviewed: 04/13/2013 [facility] Patient Information 2015 [place], [place]. This information is not intended to replace advice given to you by your health care provider. Make sure you discuss any questions you have with your health care provider. Edema Edema is an abnormal buildup of fluids in your bodytissues. Edema is somewhatdependent on gravity to pull the fluid to the lowest place in your body. That makes the condition more common in the legs and thighs (lower extremities). Painless swelling of the feet and ankles is common and becomes more likely as you get older. It is also common in looser tissues, like around your eyes.  When the affected area is squeezed, the fluid may move out of that spot and leave a dent for a few moments. This dent is called pitting.  CAUSES  There are many possible causes of edema. Eating too much salt and being on your feet or sitting for a long time can cause edema in your legs and ankles. Hot weather may make edema worse. Common medical causes of edema include:  Heart failure.  Liver disease.  Kidney disease.  Weak blood vessels in your legs.  Cancer.  An injury.  Pregnancy.  Some medications.  Obesity. SYMPTOMS  Edema is usually painless.Your skin may look swollen or shiny.  DIAGNOSIS  Your health care provider may be able to diagnose edema by asking about your medical history and doing a physical exam. You may need to have tests such as X-rays, an electrocardiogram, or blood tests to check for medical conditions that may cause edema.  TREATMENT  Edema treatment depends on the cause. If you have heart, liver, or kidney disease, you need the treatment appropriate for these conditions. General treatment may include:  Elevation of the affected body part above the level of your heart.  Compression of the affected  body part. Pressure from elastic bandages or support stockings squeezes the tissues and forces fluid  back into the blood vessels. This keeps fluid from entering the tissues.  Restriction of fluid and salt intake.  Use of a water pill (diuretic). These medications are appropriate only for some types of edema. They pull fluid out of your body and make you urinate more often. This gets rid of fluid and reduces swelling, but diuretics can have side effects. Only use diuretics as directed by your health care provider. HOME CARE INSTRUCTIONS   Keep the affected body part above the level of your heart when you are lying down.   Do not sit still or stand for prolonged periods.   Do not put anything directly under your knees when lying down.  Do not wear constricting clothing or garters on your upper legs.   Exercise your legs to work the fluid back into your blood vessels. This may help the swelling go down.   Wear elastic bandages or support stockings to reduce ankle swelling as directed by your health care provider.   Eat a low-salt diet to reduce fluid if your health care provider recommends it.   Only take medicines as directed by your health care provider. SEEK MEDICAL CARE IF:   Your edema is not responding to treatment.  You have heart, liver, or kidney disease and notice symptoms of edema.  You have edema in your legs that does not improve after elevating them.   You have sudden and unexplained weight gain. SEEK IMMEDIATE MEDICAL CARE IF:   You develop shortness of breath or chest pain.   You cannot breathe when you lie down.  You develop pain, redness, or warmth in the swollen areas.   You have heart, liver, or kidney disease and suddenly get edema.  You have a fever and your symptoms suddenly get worse. MAKE SURE YOU:   Understand these instructions.  Will watch your condition.  Will get help right away if you are not doing well or get worse. Document Released: 05/12/2005 Document Revised: 09/26/2013 Document Reviewed: 03/04/2013 [facility] Patient  Information 2015 [place], [place]. This information is not intended to replace advice given to you by your health care provider. Make sure you discuss any questions you have with your health care provider.

## 2014-06-12 NOTE — Progress Notes (Signed)
   Subjective:    Patient ID: Candace Rodriguez, female    DOB: 09/08/1965, 49 y.o.   MRN: [id number]  Hyperlipidemia This is a chronic problem. The current episode started more than 1 year ago. The problem is uncontrolled. Recent lipid tests were reviewed and are high. Exacerbating diseases include hypothyroidism. She has no history of diabetes. Pertinent negatives include no leg pain, myalgias or shortness of breath. Current antihyperlipidemic treatment includes herbal therapy (Pt does not want to be on statins). The current treatment provides mild improvement of lipids. Risk factors for coronary artery disease include dyslipidemia, hypertension and obesity.  Thyroid Problem Symptoms include fatigue. Patient reports no palpitations. Her past medical history is significant for hyperlipidemia. There is no history of diabetes.      Review of Systems  Constitutional: Positive for fatigue.  HENT: Negative.   Eyes: Negative.   Respiratory: Negative.  Negative for shortness of breath.   Cardiovascular: Negative.  Negative for palpitations.  Gastrointestinal: Negative.   Endocrine: Negative.   Genitourinary: Negative.   Musculoskeletal: Negative.  Negative for myalgias.  Neurological: Negative.  Negative for headaches.  Hematological: Negative.   Psychiatric/Behavioral: Negative.   All other systems reviewed and are negative.      Objective:   Physical Exam  Constitutional: She is oriented to person, place, and time. She appears well-developed and well-nourished. No distress.  HENT:  Head: Normocephalic and atraumatic.  Right Ear: External ear normal.  Left Ear: External ear normal.  Nose: Nose normal.  Mouth/Throat: Oropharynx is clear and moist.  Eyes: Pupils are equal, round, and reactive to light.  Neck: Normal range of motion. Neck supple. No thyromegaly present.  Cardiovascular: Normal rate, regular rhythm, normal heart sounds and intact distal pulses.   No murmur  heard. Pulmonary/Chest: Effort normal and breath sounds normal. No respiratory distress. She has no wheezes.  Abdominal: Soft. Bowel sounds are normal. She exhibits no distension. There is no tenderness.  Musculoskeletal: Normal range of motion. She exhibits edema (trace amt in bilateral ankles). She exhibits no tenderness.  Neurological: She is alert and oriented to person, place, and time. She has normal reflexes. No cranial nerve deficit.  Skin: Skin is warm and dry.  Psychiatric: She has a normal mood and affect. Her behavior is normal. Judgment and thought content normal.  Vitals reviewed.   BP 113/78 mmHg  Pulse 93  Temp(Src) 99.1 F (37.3 C) (Oral)  Ht _0  (1.727 m)  Wt 198 lb 3.2 oz (89.903 kg)  BMI 30.14 kg/m2       Assessment & Plan:  1. Essential hypertension, benign - CMP14+EGFR  2. Hypothyroidism, unspecified hypothyroidism type - CMP14+EGFR  3. Restless leg syndrome, controlled - CMP14+EGFR - rOPINIRole (REQUIP) 1 MG tablet; Take 1 tablet (1 mg total) by mouth daily.  Dispense: 90 tablet; Refill: 3  4. Dyslipidemia - CMP14+EGFR - Lipid panel  5. Vitamin D deficiency - Vit D  25 hydroxy (rtn osteoporosis monitoring)  6. Peripheral edema - Brain natriuretic peptide  7. PCOS (polycystic ovarian syndrome) - metFORMIN (GLUCOPHAGE) 1000 MG tablet; Take 1 tablet (1,000 mg total) by mouth daily with breakfast.  Dispense: 90 tablet; Refill: 3  8. Dysuria - POCT UA - Microscopic Only - POCT urinalysis dipstick   Continue all meds Labs pending Health Maintenance reviewed Diet and exercise encouraged RTO 6 months  Evelina Dun, FNP

## 2014-06-13 LAB — CMP14+EGFR
A/G RATIO: 2.3 (ref 1.1–2.5)
ALT: 23 IU/L (ref 0–32)
AST: 14 IU/L (ref 0–40)
Albumin: 4.3 g/dL (ref 3.5–5.5)
Alkaline Phosphatase: 82 IU/L (ref 39–117)
BILIRUBIN TOTAL: 1.4 mg/dL — AB (ref 0.0–1.2)
BUN/Creatinine Ratio: 24 — ABNORMAL HIGH (ref 9–23)
BUN: 12 mg/dL (ref 6–24)
CHLORIDE: 101 mmol/L (ref 97–108)
CO2: 26 mmol/L (ref 18–29)
CREATININE: 0.49 mg/dL — AB (ref 0.57–1.00)
Calcium: 9.8 mg/dL (ref 8.7–10.2)
GFR calc non Af Amer: 115 mL/min/{1.73_m2} (ref >59)
GFR, EST AFRICAN AMERICAN: 132 mL/min/{1.73_m2} (ref >59)
GLUCOSE: 84 mg/dL (ref 65–99)
Globulin, Total: 1.9 g/dL (ref 1.5–4.5)
Potassium: 4.1 mmol/L (ref 3.5–5.2)
Sodium: 142 mmol/L (ref 134–144)
Total Protein: 6.2 g/dL (ref 6.0–8.5)

## 2014-06-13 LAB — LIPID PANEL
Chol/HDL Ratio: 3.1 ratio units (ref 0.0–4.4)
Cholesterol, Total: 181 mg/dL (ref 100–199)
HDL: 58 mg/dL (ref >39)
LDL Calculated: 94 mg/dL (ref 0–99)
Triglycerides: 145 mg/dL (ref 0–149)
VLDL Cholesterol Cal: 29 mg/dL (ref 5–40)

## 2014-06-13 LAB — VITAMIN D 25 HYDROXY (VIT D DEFICIENCY, FRACTURES): VIT D 25 HYDROXY: 61.2 ng/mL (ref 30.0–100.0)

## 2014-06-13 LAB — URINE CULTURE: ORGANISM ID, BACTERIA: NO GROWTH

## 2014-06-13 LAB — BRAIN NATRIURETIC PEPTIDE: BNP: 27.2 pg/mL (ref 0.0–100.0)

## 2014-08-10 ENCOUNTER — Other Ambulatory Visit: Payer: Self-pay | Admitting: Nurse Practitioner

## 2014-10-12 ENCOUNTER — Ambulatory Visit (INDEPENDENT_AMBULATORY_CARE_PROVIDER_SITE_OTHER): Payer: BC Managed Care – PPO | Admitting: Nurse Practitioner

## 2014-10-12 ENCOUNTER — Encounter: Payer: Self-pay | Admitting: Nurse Practitioner

## 2014-10-12 VITALS — BP 127/73 | HR 96 | Temp 97.7°F | Ht 68.0 in | Wt 213.0 lb

## 2014-10-12 DIAGNOSIS — J069 Acute upper respiratory infection, unspecified: Secondary | ICD-10-CM | POA: Diagnosis not present

## 2014-10-12 MED ORDER — HYDROCODONE-HOMATROPINE 5-1.5 MG/5ML PO SYRP: 5.0000 mL | ORAL_SOLUTION | Freq: Four times a day (QID) | ORAL | Status: DC | PRN

## 2014-10-12 MED ORDER — METHYLPREDNISOLONE ACETATE 80 MG/ML IJ SUSP
80.0000 mg | Freq: Once | INTRAMUSCULAR | Status: AC
  Administered 2014-10-12: 80 mg via INTRAMUSCULAR

## 2014-10-12 MED ORDER — AZITHROMYCIN 250 MG PO TABS: ORAL_TABLET | ORAL | Status: DC

## 2014-10-12 NOTE — Progress Notes (Signed)
   Subjective:    Patient ID: Candace Rodriguez, female    DOB: January 01, 1966, 49 y.o.   MRN: [id number]  HPI Patient in c/o cough and congestion- no fever- started over a week ago- has developed a sore throat. Had some keflex at home and started taking it Saturday- feels a little better but still has productive cough.    Review of Systems  Constitutional: Negative.   HENT: Positive for congestion, sinus pressure and sore throat. Negative for trouble swallowing.   Respiratory: Positive for cough (productive).   Cardiovascular: Negative.   Gastrointestinal: Negative.   Genitourinary: Negative.   Neurological: Negative.   Psychiatric/Behavioral: Negative.   All other systems reviewed and are negative.      Objective:   Physical Exam  Constitutional: She is oriented to person, place, and time. She appears well-developed and well-nourished. No distress.  HENT:  Right Ear: Hearing, tympanic membrane, external ear and ear canal normal.  Left Ear: Hearing, tympanic membrane, external ear and ear canal normal.  Nose: Mucosal edema and rhinorrhea present. Right sinus exhibits maxillary sinus tenderness. Right sinus exhibits no frontal sinus tenderness. Left sinus exhibits maxillary sinus tenderness. Left sinus exhibits no frontal sinus tenderness.  Mouth/Throat: Uvula is midline, oropharynx is clear and moist and mucous membranes are normal.  Eyes: Pupils are equal, round, and reactive to light.  Neck: Normal range of motion. Neck supple.  Cardiovascular: Normal rate, regular rhythm and normal heart sounds.   Pulmonary/Chest: Effort normal and breath sounds normal. No respiratory distress. She has no wheezes. She has no rales.  Dry cough  Neurological: She is alert and oriented to person, place, and time.  Skin: Skin is warm and dry.  Psychiatric: She has a normal mood and affect. Her behavior is normal. Judgment and thought content normal.  BP 127/73 mmHg  Pulse 96  Temp(Src) 97.7 F  (36.5 C) (Oral)  Ht 5\' 8"  (1.727 m)  Wt 213 lb (96.616 kg)  BMI 32.39 kg/m2         Assessment & Plan:  1. Upper respiratory infection with cough and congestion 1. Take meds as prescribed 2. Use a cool mist humidifier especially during the winter months and when heat has been humid. 3. Use saline nose sprays frequently 4. Saline irrigations of the nose can be very helpful if done frequently.  * 4X daily for 1 week*  * Use of a nettie pot can be helpful with this. Follow directions with this* 5. Drink plenty of fluids 6. Keep thermostat turn down low 7.For any cough or congestion  Use plain Mucinex- regular strength or max strength is fine   * Children- consult with Pharmacist for dosing 8. For fever or aces or pains- take tylenol or ibuprofen appropriate for age and weight.  * for fevers greater than 101 orally you may alternate ibuprofen and tylenol every  3 hours.    - azithromycin (ZITHROMAX Z-PAK) 250 MG tablet; As directed  Dispense: 1 each; Refill: 0 - HYDROcodone-homatropine (HYCODAN) 5-1.5 MG/5ML syrup; Take 5 mLs by mouth every 6 (six) hours as needed for cough.  Dispense: 120 mL; Refill: 0 - methylPREDNISolone acetate (DEPO-MEDROL) injection 80 mg; Inject 1 mL (80 mg total) into the muscle once.  Mary-Margaret Hassell Done, FNP

## 2014-10-12 NOTE — Patient Instructions (Signed)

## 2014-10-20 ENCOUNTER — Encounter: Payer: Self-pay | Admitting: Pulmonary Disease

## 2014-10-20 ENCOUNTER — Ambulatory Visit (INDEPENDENT_AMBULATORY_CARE_PROVIDER_SITE_OTHER): Payer: BC Managed Care – PPO | Admitting: Pulmonary Disease

## 2014-10-20 VITALS — BP 132/78 | HR 98 | Ht 67.75 in | Wt 210.0 lb

## 2014-10-20 DIAGNOSIS — G4733 Obstructive sleep apnea (adult) (pediatric): Secondary | ICD-10-CM

## 2014-10-20 DIAGNOSIS — Z9989 Dependence on other enabling machines and devices: Principal | ICD-10-CM

## 2014-10-20 NOTE — Patient Instructions (Signed)
Will arrange for new CPAP machine and new CPAP supplies Follow up in 2 months

## 2014-10-20 NOTE — Progress Notes (Signed)
Chief Complaint  Patient presents with  . SLEEP CONSULT    Referred by Dr Domingo Cocking. Sleep study 2009. Current CPAP. Epworth Score: 5    History of Present Illness: Candace Rodriguez is a 49 y.o. female for evaluation of sleep problems.  She has history of headaches with abnormal migraines.  She was followed by neurology for this at the [facility].  She had a sleep study in 2009 and this showed sleep apnea.  She was started on CPAP and has been using same device ever since.  She has nasal pillows mask.    Her sleep has gotten worse.  She is up and down all night.  Her husband has arthritis, and she has to get up frequently to assist him.  As a result she is very tired during the day, and it is a struggle for her to stay awake after 930 pm.  Once she sits down she can easily fall asleep.  She tends to be a mouth breather at night, and she is not sure if she has air leaking from her mask.  She will wear ear plugs at night.  She is followed by Dr. Estanislado Pandy with rheumatology for fibromyalgia and osteoarthritis.  She also taker requip for restless legs, and these are controlled with medication.  She works as a Electrical engineer.  She goes to sleep at 930 pm.  She falls asleep 5 minutes.  She wakes up 1 or 2 times to use the bathroom.  She gets out of bed at 615 am.  She feels tired in the morning.  She denies morning headache.  She does not use anything to help her stay awake.  She will take melatonin at night.  She denies sleep walking, sleep talking, bruxism, or nightmares.  There is no history of restless legs.  She denies sleep hallucinations, sleep paralysis, or cataplexy.  The Epworth score is 5 out of 24.  Tests: CPAP 04/23/14 to 10/19/14 >> used on 180 of 180 nights with average 7 hrs and 43 min.  Average AHI is 4.7 with CPAP 12 cm H2O.   Candace Rodriguez  has a past medical history of Thyroid disease; Hypertension; Obesity; Hyperlipidemia; Restless leg syndrome; DJD  (degenerative joint disease); Depression; Migraine; Sleep apnea; Tinnitus; Fractures; Asthma; and OSA (obstructive sleep apnea).  Candace Rodriguez  has past surgical history that includes Knee surgery; Wrist surgery (09/1998); Laparoscopic abdominal exploration (06/1993); Cholecystectomy (10/2013); Joint replacement (Bilateral, 10/2008); and Partial gastrectomy ([id number]).  Prior to Admission medications   Medication Sig Start Date End Date Taking? Authorizing Provider  albuterol (PROVENTIL HFA;VENTOLIN HFA) 108 (90 BASE) MCG/ACT inhaler Inhale 2 puffs into the lungs every 6 (six) hours as needed for wheezing. 11/07/13  Yes Sharion Balloon, FNP  Alpha-Lipoic Acid 200 MG TABS Take by mouth.   Yes Historical Provider, MD  Calcium 200 MG TABS Take by mouth 3 (three) times daily.   Yes Historical Provider, MD  Cholecalciferol (VITAMIN D-1000 MAX ST) 1000 UNITS tablet Take 5,000 Units by mouth.    Yes Historical Provider, MD  Cinnamon 500 MG capsule Take 4,000 mg by mouth.    Yes Historical Provider, MD  Coenzyme Q10 (COQ10) 100 MG CAPS Take 1 capsule by mouth daily.   Yes Historical Provider, MD  estradiol (ESTRACE) 0.1 MG/GM vaginal cream Place 1 Applicatorful vaginally at bedtime.   Yes Historical Provider, MD  fish oil-omega-3 fatty acids 1000 MG capsule Take 1 g by mouth 4 (four) times  daily.    Yes Historical Provider, MD  furosemide (LASIX) 20 MG tablet Take 20 mg by mouth daily. 05/15/14  Yes Historical Provider, MD  Ginger, Zingiber officinalis, (GINGER ROOT) 550 MG CAPS Take by mouth daily.   Yes Historical Provider, MD  HYDROcodone-homatropine (HYCODAN) 5-1.5 MG/5ML syrup Take 5 mLs by mouth every 6 (six) hours as needed for cough. 10/12/14  Yes Mary-Margaret Hassell Done, FNP  Melatonin 10 MG CAPS Take 10 mg by mouth.   Yes Historical Provider, MD  metFORMIN (GLUCOPHAGE) 1000 MG tablet Take 1 tablet (1,000 mg total) by mouth daily with breakfast. 06/12/14  Yes Sharion Balloon, FNP  Multiple Vitamin  (MULTIVITAMIN) tablet Take 1 tablet by mouth 3 (three) times daily. Celebrate Bariatric   Yes Historical Provider, MD  NATURE-THROID 65 MG tablet Take 65 mg by mouth 2 (two) times daily. 06/09/14  Yes Historical Provider, MD  progesterone (PROMETRIUM) 200 MG capsule Take 300 mg by mouth daily.   Yes Historical Provider, MD  Red Yeast Rice 600 MG TABS Take 1,200 mg by mouth.   Yes Historical Provider, MD  rOPINIRole (REQUIP) 1 MG tablet Take 1 tablet (1 mg total) by mouth daily. 06/12/14  Yes Sharion Balloon, FNP  UNABLE TO FIND Med Name: Methylation Complete Takes BID   Yes Historical Provider, MD  vitamin E 400 UNIT capsule Take 400 Units by mouth 2 (two) times daily.    Yes Historical Provider, MD  clindamycin (CLEOCIN) 300 MG capsule  05/18/14   Historical Provider, MD  Magnesium Malate POWD 400 mg by Does not apply route.    Historical Provider, MD    Allergies  Allergen Reactions  . Avelox [Moxifloxacin]   . Gluten Meal Other (See Comments)    Joint Pain  . Other Other (See Comments)    Dairy intolerance  . Penicillin G Itching and Other (See Comments)    Turns red in color.  . Penicillins   . Quinolones Other (See Comments)    Pt can't remeber  . Soy Allergy Rash    Rash in mouth.    Her family history includes Arthritis in her father and mother; Cancer in her maternal grandfather, maternal grandmother, and paternal grandmother; Heart disease in her maternal grandmother and paternal grandfather; Hypertension in her father.  She  reports that she quit smoking about 16 years ago. Her smoking use included Cigarettes. She has a 20 pack-year smoking history. She does not have any smokeless tobacco history on file. She reports that she does not drink alcohol or use illicit drugs.  Review of Systems  Constitutional: Negative for fever and unexpected weight change.  HENT: Positive for congestion and sore throat. Negative for dental problem, ear pain, nosebleeds, postnasal drip,  rhinorrhea, sinus pressure, sneezing and trouble swallowing.   Eyes: Positive for pain. Negative for redness and itching.  Respiratory: Positive for cough. Negative for chest tightness, shortness of breath and wheezing.        Recent strep and URI x 2 weeks ago  Cardiovascular: Negative for palpitations and leg swelling.  Gastrointestinal: Negative for nausea and vomiting.  Genitourinary: Negative for dysuria.  Musculoskeletal: Negative for joint swelling.  Skin: Negative for rash.  Neurological: Negative for headaches.  Hematological: Does not bruise/bleed easily.  Psychiatric/Behavioral: Negative for dysphoric mood. The patient is not nervous/anxious.    Physical Exam: Blood pressure 132/78, pulse 98, height 5' 7.75" (1.721 m), weight 210 lb (95.255 kg), SpO2 98 %. Body mass index is 32.16 kg/(m^2).  General - No distress ENT - No sinus tenderness, no oral exudate, no LAN, no thyromegaly, TM clear, pupils equal/reactive Cardiac - s1s2 regular, no murmur, pulses symmetric Chest - No wheeze/rales/dullness, good air entry, normal respiratory excursion Back - No focal tenderness Abd - Soft, non-tender, no organomegaly, + bowel sounds Ext - No edema Neuro - Normal strength, cranial nerves intact Skin - No rashes Psych - Normal mood, and behavior  Discussion: She has history of sleep apnea and has been using CPAP.  Her current download shows good control of her apnea with CPAP 12 cm H2O.  Her machine is old, and she should be eligible for a new device.  She also needs replacement supplies.  Assessment/plan:  Obstructive sleep apnea. Plan: - will arrange for new CPAP supplies and new CPAP machine  Daytime sleepiness. Likely related to other medical conditions and sleep disruption related to family duties. Plan: - reviewed proper sleep hygiene   Chesley Mires, M.D. Pager [phone]

## 2014-10-20 NOTE — Progress Notes (Deleted)
   Subjective:    Patient ID: Candace Rodriguez, female    DOB: [birth year], 49 y.o.   MRN: [id number]  HPI    Review of Systems  Constitutional: Negative for fever and unexpected weight change.  HENT: Positive for congestion and sore throat. Negative for dental problem, ear pain, nosebleeds, postnasal drip, rhinorrhea, sinus pressure, sneezing and trouble swallowing.   Eyes: Positive for pain. Negative for redness and itching.  Respiratory: Positive for cough. Negative for chest tightness, shortness of breath and wheezing.        Recent strep and URI x 2 weeks ago  Cardiovascular: Negative for palpitations and leg swelling.  Gastrointestinal: Negative for nausea and vomiting.  Genitourinary: Negative for dysuria.  Musculoskeletal: Negative for joint swelling.  Skin: Negative for rash.  Neurological: Negative for headaches.  Hematological: Does not bruise/bleed easily.  Psychiatric/Behavioral: Negative for dysphoric mood. The patient is not nervous/anxious.        Objective:   Physical Exam        Assessment & Plan:

## 2014-10-25 ENCOUNTER — Encounter: Payer: Self-pay | Admitting: Pulmonary Disease

## 2014-10-25 DIAGNOSIS — G4733 Obstructive sleep apnea (adult) (pediatric): Secondary | ICD-10-CM | POA: Insufficient documentation

## 2014-10-26 ENCOUNTER — Telehealth: Payer: Self-pay | Admitting: Pulmonary Disease

## 2014-10-26 DIAGNOSIS — G4733 Obstructive sleep apnea (adult) (pediatric): Secondary | ICD-10-CM

## 2014-10-26 NOTE — Telephone Encounter (Signed)
Spoke with pt. She wants to know if she should repeat sleep study since she has lost weight instead of ordering a new CPAP to see if she still has sleep apnea problem. Please advise VS thanks

## 2014-10-27 NOTE — Telephone Encounter (Signed)
LMTCB x 1 

## 2014-10-27 NOTE — Telephone Encounter (Signed)
Okay to arrange for in lab sleep study to assess status of sleep apnea before setting up new CPAP machine.

## 2014-10-27 NOTE — Telephone Encounter (Signed)
Patient has lost 150lbs since her last sleep study and she would like to know if she should having another sleep study test done to see if she still has Apnea problems.    VS - please advise.

## 2014-10-30 NOTE — Telephone Encounter (Signed)
Regular NPSG please.

## 2014-10-30 NOTE — Telephone Encounter (Signed)
Order placed for NPSG.  Nothing further needed.

## 2014-10-30 NOTE — Telephone Encounter (Signed)
[phone] calling back

## 2014-10-30 NOTE — Telephone Encounter (Signed)
Pt aware that we will repeat her sleep study to assess her sleep apnea before setting up CPAP machine.  Pt aware that she will be getting a call sometime today or tomorrow about this appt.   Dr Halford Chessman please advise if you would like her to have a regular NPSG or Split Night. Thanks.

## 2014-12-19 ENCOUNTER — Ambulatory Visit (INDEPENDENT_AMBULATORY_CARE_PROVIDER_SITE_OTHER): Payer: BC Managed Care – PPO | Admitting: Family Medicine

## 2014-12-19 ENCOUNTER — Encounter: Payer: Self-pay | Admitting: Family Medicine

## 2014-12-19 VITALS — BP 114/70 | HR 77 | Temp 98.7°F | Ht 67.75 in | Wt 205.4 lb

## 2014-12-19 DIAGNOSIS — R509 Fever, unspecified: Secondary | ICD-10-CM | POA: Diagnosis not present

## 2014-12-19 LAB — POCT UA - MICROSCOPIC ONLY
Bacteria, U Microscopic: NEGATIVE
CRYSTALS, UR, HPF, POC: NEGATIVE
Casts, Ur, LPF, POC: NEGATIVE
Mucus, UA: NEGATIVE
RBC, urine, microscopic: NEGATIVE
WBC, Ur, HPF, POC: NEGATIVE
Yeast, UA: NEGATIVE

## 2014-12-19 LAB — POCT URINALYSIS DIPSTICK
BILIRUBIN UA: NEGATIVE
Glucose, UA: NEGATIVE
Ketones, UA: NEGATIVE
Leukocytes, UA: NEGATIVE
NITRITE UA: NEGATIVE
PROTEIN UA: NEGATIVE
RBC UA: NEGATIVE
Spec Grav, UA: 1.01
Urobilinogen, UA: NEGATIVE
pH, UA: 6

## 2014-12-19 LAB — POCT CBC
Granulocyte percent: 70.4 %G (ref 37–80)
HCT, POC: 41.3 % (ref 37.7–47.9)
Hemoglobin: 13.5 g/dL (ref 12.2–16.2)
Lymph, poc: 1.5 (ref 0.6–3.4)
MCH, POC: 26 pg — AB (ref 27–31.2)
MCHC: 32.8 g/dL (ref 31.8–35.4)
MCV: 79.2 fL — AB (ref 80–97)
MPV: 7.9 fL (ref 0–99.8)
POC Granulocyte: 4.4 (ref 2–6.9)
POC LYMPH PERCENT: 24.9 %L (ref 10–50)
Platelet Count, POC: 188 10*3/uL (ref 142–424)
RBC: 5.21 M/uL (ref 4.04–5.48)
RDW, POC: 13.3 %
WBC: 6.2 10*3/uL (ref 4.6–10.2)

## 2014-12-19 NOTE — Progress Notes (Signed)
Subjective:  Patient ID: Candace Rodriguez, female    DOB: 1965-11-04  Age: 49 y.o. MRN: [id number]  CC: Fever   HPI Candace Rodriguez presents for fever to 102 2 days ago then 103.2 inspite of tylenol. Had severe HA and ear pain.Hx atypical migraines. Patient seen at urgent care 2 days ago. No relief from treatment offered there.Also severe suprapubic pain. Frequent UTI in past with Group B strep.HA dropped from 9/10 at onset to 3/10 at exam today . Frontal , temporal and occipital ache. History Candace Rodriguez has a past medical history of Thyroid disease; Hypertension; Obesity; Hyperlipidemia; Restless leg syndrome; DJD (degenerative joint disease); Depression; Migraine; Sleep apnea; Tinnitus; Fractures; Asthma; and OSA (obstructive sleep apnea).   She has past surgical history that includes Knee surgery; Wrist surgery (09/1998); Laparoscopic abdominal exploration (06/1993); Cholecystectomy (10/2013); Joint replacement (Bilateral, 10/2008); and Partial gastrectomy ([id number]).   Her family history includes Arthritis in her father and mother; Cancer in her maternal grandfather, maternal grandmother, and paternal grandmother; Heart disease in her maternal grandmother and paternal grandfather; Hypertension in her father.She reports that she quit smoking about 16 years ago. Her smoking use included Cigarettes. She has a 20 pack-year smoking history. She does not have any smokeless tobacco history on file. She reports that she does not drink alcohol or use illicit drugs.  Outpatient Prescriptions Prior to Visit  Medication Sig Dispense Refill  . albuterol (PROVENTIL HFA;VENTOLIN HFA) 108 (90 BASE) MCG/ACT inhaler Inhale 2 puffs into the lungs every 6 (six) hours as needed for wheezing. 1 Inhaler 4  . Alpha-Lipoic Acid 200 MG TABS Take 600 mg by mouth.     . Calcium 200 MG TABS Take by mouth 3 (three) times daily.    . Cholecalciferol (VITAMIN D-1000 MAX ST) 1000 UNITS tablet Take 5,000 Units by mouth.       . Cinnamon 500 MG capsule Take 4,000 mg by mouth.     . clindamycin (CLEOCIN) 300 MG capsule     . Coenzyme Q10 (COQ10) 100 MG CAPS Take 1 capsule by mouth daily.    . fish oil-omega-3 fatty acids 1000 MG capsule Take 1 g by mouth 4 (four) times daily.     . Ginger, Zingiber officinalis, (GINGER ROOT) 550 MG CAPS Take by mouth daily.    . L-Glutamine 500 MG CAPS Take by mouth daily.    . Magnesium Malate POWD 400 mg by Does not apply route.    . Melatonin 10 MG CAPS Take 10 mg by mouth.    . Menaquinone-7 (VITAMIN K2 PO) Take 150 mcg by mouth daily.    . Multiple Vitamin (MULTIVITAMIN) tablet Take 1 tablet by mouth 3 (three) times daily. Celebrate Bariatric    . NATURE-THROID 65 MG tablet Take 65 mg by mouth 2 (two) times daily.    Marland Kitchen PLANT STEROLS AND STANOLS PO Take by mouth daily.    . Probiotic Product (PROBIOTIC DAILY PO) Take by mouth 2 (two) times daily.    . progesterone (PROMETRIUM) 200 MG capsule Take 300 mg by mouth daily.    . Red Yeast Rice 600 MG TABS Take 1,200 mg by mouth.    Marland Kitchen rOPINIRole (REQUIP) 1 MG tablet Take 1 tablet (1 mg total) by mouth daily. 90 tablet 3  . Turmeric Curcumin 500 MG CAPS Take 1 capsule by mouth daily.    Marland Kitchen UNABLE TO FIND Med Name: Methylation Complete Takes BID    . UNABLE TO FIND Med Name: Hart Robinsons  535m BID    . UNABLE TO FIND Med Name: Iron 281mQD    . UNABLE TO FIND Med Name: Citrical Petites 20079mID    . UNABLE TO FIND Take 75 mg by mouth 2 (two) times daily. Med Name: Pregnenolone 100m79m    . vitamin E 400 UNIT capsule Take 400 Units by mouth 2 (two) times daily.     . COLOSTRUM PO Take by mouth. Take 1 tsp QD    . estradiol (ESTRACE) 0.1 MG/GM vaginal cream Place 1 Applicatorful vaginally at bedtime.    . furosemide (LASIX) 20 MG tablet Take 20 mg by mouth daily.    . HYMarland KitchenROcodone-homatropine (HYCODAN) 5-1.5 MG/5ML syrup Take 5 mLs by mouth every 6 (six) hours as needed for cough. (Patient not taking: Reported on 12/19/2014) 120 mL 0   . metFORMIN (GLUCOPHAGE) 1000 MG tablet Take 1 tablet (1,000 mg total) by mouth daily with breakfast. (Patient not taking: Reported on 12/19/2014) 90 tablet 3   No facility-administered medications prior to visit.    ROS Review of Systems  Constitutional: Positive for fever and fatigue. Negative for chills, diaphoresis, appetite change and unexpected weight change.  HENT: Positive for ear pain. Negative for congestion, hearing loss, postnasal drip, rhinorrhea, sneezing, sore throat and trouble swallowing.   Eyes: Negative for pain.  Respiratory: Negative for cough, chest tightness and shortness of breath.   Cardiovascular: Negative for chest pain and palpitations.  Gastrointestinal: Negative for nausea, vomiting, abdominal pain, diarrhea and constipation.  Genitourinary: Negative for dysuria, frequency and menstrual problem.  Musculoskeletal: Positive for myalgias and arthralgias. Negative for joint swelling.  Skin: Negative for rash.  Neurological: Positive for headaches. Negative for dizziness, weakness and numbness.  Psychiatric/Behavioral: Negative for dysphoric mood and agitation.    Objective:  BP 114/70 mmHg  Pulse 77  Temp(Src) 98.7 F (37.1 C) (Oral)  Ht 5' 7.75" (1.721 m)  Wt 205 lb 6.4 oz (93.169 kg)  BMI 31.46 kg/m2  BP Readings from Last 3 Encounters:  12/19/14 114/70  10/20/14 132/78  10/12/14 127/73    Wt Readings from Last 3 Encounters:  12/19/14 205 lb 6.4 oz (93.169 kg)  10/20/14 210 lb (95.255 kg)  10/12/14 213 lb (96.616 kg)     Physical Exam  Constitutional: She is oriented to person, place, and time. She appears well-developed and well-nourished. No distress.  HENT:  Head: Normocephalic and atraumatic.  Right Ear: External ear normal.  Left Ear: External ear normal.  Nose: Nose normal.  Mouth/Throat: Oropharynx is clear and moist.  Eyes: Conjunctivae and EOM are normal. Pupils are equal, round, and reactive to light.  Neck: Normal range of  motion. Neck supple. No thyromegaly present.  Cardiovascular: Normal rate, regular rhythm and normal heart sounds.   No murmur heard. Pulmonary/Chest: Effort normal and breath sounds normal. No respiratory distress. She has no wheezes. She has no rales.  Abdominal: Soft. Bowel sounds are normal. She exhibits no distension. There is no tenderness.  Lymphadenopathy:    She has no cervical adenopathy.  Neurological: She is alert and oriented to person, place, and time. She has normal reflexes.  Skin: Skin is warm and dry.  Psychiatric: She has a normal mood and affect. Her behavior is normal. Judgment and thought content normal.    No results found for: HGBA1C  Lab Results  Component Value Date   WBC 6.2 12/19/2014   HGB 13.5 12/19/2014   HCT 41.3 12/19/2014   PLT 316 11/15/2008  GLUCOSE 76 12/19/2014   CHOL 181 06/12/2014   TRIG 145 06/12/2014   HDL 58 06/12/2014   LDLCALC 94 06/12/2014   ALT 17 12/19/2014   AST 13 12/19/2014   NA 144 12/19/2014   K 4.5 12/19/2014   CL 103 12/19/2014   CREATININE 0.50* 12/19/2014   BUN 9 12/19/2014   CO2 24 12/19/2014   TSH 0.010* 04/20/2013   INR 1.0 11/08/2008    No results found.  Assessment & Plan:   Chany was seen today for fever.  Diagnoses and all orders for this visit:  Fever, unspecified fever cause Orders: -     POCT UA - Microscopic Only -     POCT urinalysis dipstick -     POCT CBC -     CMP14+EGFR   I have discontinued Ms. Calandra's metFORMIN and HYDROcodone-homatropine. I am also having her maintain her fish oil-omega-3 fatty acids, albuterol, Alpha-Lipoic Acid, Cholecalciferol, Cinnamon, Magnesium Malate, Red Yeast Rice, vitamin E, furosemide, progesterone, NATURE-THROID, clindamycin, Melatonin, rOPINIRole, multivitamin, Calcium, Ginger Root, estradiol, UNABLE TO FIND, CoQ10, UNABLE TO FIND, Probiotic Product (PROBIOTIC DAILY PO), Menaquinone-7 (VITAMIN K2 PO), UNABLE TO FIND, UNABLE TO FIND, Turmeric Curcumin,  PLANT STEROLS AND STANOLS PO, UNABLE TO FIND, COLOSTRUM PO, and L-Glutamine.  No orders of the defined types were placed in this encounter.     Follow-up: Return if symptoms worsen or fail to improve.  Claretta Fraise, M.D.

## 2014-12-20 LAB — CMP14+EGFR
ALT: 17 IU/L (ref 0–32)
AST: 13 IU/L (ref 0–40)
Albumin/Globulin Ratio: 1.8 (ref 1.1–2.5)
Albumin: 3.7 g/dL (ref 3.5–5.5)
Alkaline Phosphatase: 63 IU/L (ref 39–117)
BUN/Creatinine Ratio: 18 (ref 9–23)
BUN: 9 mg/dL (ref 6–24)
Bilirubin Total: 0.7 mg/dL (ref 0.0–1.2)
CO2: 24 mmol/L (ref 18–29)
Calcium: 9.3 mg/dL (ref 8.7–10.2)
Chloride: 103 mmol/L (ref 97–108)
Creatinine, Ser: 0.5 mg/dL — ABNORMAL LOW (ref 0.57–1.00)
GFR calc Af Amer: 131 mL/min/1.73
GFR calc non Af Amer: 113 mL/min/1.73
Globulin, Total: 2.1 g/dL (ref 1.5–4.5)
Glucose: 76 mg/dL (ref 65–99)
Potassium: 4.5 mmol/L (ref 3.5–5.2)
Sodium: 144 mmol/L (ref 134–144)
Total Protein: 5.8 g/dL — ABNORMAL LOW (ref 6.0–8.5)

## 2014-12-21 ENCOUNTER — Telehealth: Payer: Self-pay

## 2014-12-21 NOTE — Telephone Encounter (Signed)
Left detailed messaged with the lab results per release of information.

## 2015-01-16 ENCOUNTER — Ambulatory Visit (HOSPITAL_BASED_OUTPATIENT_CLINIC_OR_DEPARTMENT_OTHER): Payer: BC Managed Care – PPO | Attending: Pulmonary Disease | Admitting: Radiology

## 2015-01-16 DIAGNOSIS — R0683 Snoring: Secondary | ICD-10-CM | POA: Insufficient documentation

## 2015-01-16 DIAGNOSIS — G4730 Sleep apnea, unspecified: Secondary | ICD-10-CM | POA: Insufficient documentation

## 2015-01-16 DIAGNOSIS — Z9989 Dependence on other enabling machines and devices: Secondary | ICD-10-CM

## 2015-01-16 DIAGNOSIS — G4733 Obstructive sleep apnea (adult) (pediatric): Secondary | ICD-10-CM

## 2015-01-17 ENCOUNTER — Telehealth: Payer: Self-pay | Admitting: Pulmonary Disease

## 2015-01-17 NOTE — Telephone Encounter (Signed)
PSG 01/16/15 >> AHI 1.5, SpO2 low 85%, PLMI 0.   Will have my nurse inform pt that based on current sleep study she no longer has sleep apnea.  Her sleep apnea has resolved with weight loss.  She no longer needs to use CPAP.  She does not need additional pulmonary/sleep follow up unless her sleep pattern gets worse.

## 2015-01-17 NOTE — Progress Notes (Signed)
Patient Name: Candace Rodriguez, Candace Rodriguez Date: 01/16/2015 Gender: Female D.O.B: May 15, 1966 Age (years): 89 Referring Provider: Chesley Mires MD, ABSM Height (inches): 56 Interpreting Physician: Chesley Mires MD, ABSM Weight (lbs): 194 RPSGT: Laren Everts BMI: 29 MRN: [id number] Neck Size: 13.50  CLINICAL INFORMATION Sleep Study Type: NPSG Indication for sleep study: She has a history of sleep apnea and has been using CPAP.  She has lost significant amount of weight since original sleep study.  She presents to the sleep labe to evaluate whether she still has sleep apnea. Epworth Sleepiness Score: 1  SLEEP STUDY TECHNIQUE As per the AASM Manual for the Scoring of Sleep and Associated Events v2.3 (April 2016) with a hypopnea requiring 4% desaturations.  The channels recorded and monitored were frontal, central and occipital EEG, electrooculogram (EOG), submentalis EMG (chin), nasal and oral airflow, thoracic and abdominal wall motion, anterior tibialis EMG, snore microphone, electrocardiogram, and pulse oximetry.  MEDICATIONS Patient's medications include: reviewed in electronic medical record. Medications self-administered by patient during sleep study : No sleep medicine administered.  SLEEP ARCHITECTURE The study was initiated at 10:17:10 PM and ended at 5:33:18 AM.  Sleep onset time was 19.6 minutes and the sleep efficiency was 64.2%. The total sleep time was 280.1 minutes.  Stage REM latency was 228.0 minutes.  The patient spent 30.37% of the night in stage N1 sleep, 38.74% in stage N2 sleep, 13.57% in stage N3 and 17.32% in REM.  Alpha intrusion was absent.  Supine sleep was 46.95%.  RESPIRATORY PARAMETERS The overall apnea/hypopnea index (AHI) was 1.5 per hour. There were 0 total apneas, including 0 obstructive, 0 central and 0 mixed apneas. There were 7 hypopneas and 27 RERAs.  The AHI during Stage REM sleep was 7.4 per hour.  AHI while supine was 3.2 per hour.  The  mean oxygen saturation was 93.00%. The minimum SpO2 during sleep was 85.00%.  Soft snoring was noted during this study.  CARDIAC DATA The 2 lead EKG demonstrated sinus rhythm. The mean heart rate was 80.38 beats per minute. Other EKG findings include: None.  LEG MOVEMENT DATA The total PLMS were 0 with a resulting PLMS index of 0.00. Associated arousal with leg movement index was 0.0 .  IMPRESSIONS No significant obstructive sleep apnea occurred during this study (AHI = 1.5/h). No significant central sleep apnea occurred during this study (CAI = 0.0/h). Mild oxygen desaturation was noted during this study (Min O2 = 85.00%). The patient snored with Soft snoring volume. No cardiac abnormalities were noted during this study. Clinically significant periodic limb movements did not occur during sleep. No significant associated arousals.  DIAGNOSIS Based on this sleep study, her sleep apnea has resolved.  RECOMMENDATIONS Avoid alcohol, sedatives and other CNS depressants that may worsen sleep apnea and disrupt normal sleep architecture. Sleep hygiene should be reviewed to assess factors that may improve sleep quality. Weight management and regular exercise should be initiated or continued if appropriate.  Chesley Mires, MD, [place], American Board of Sleep Medicine  01/17/2015, 11:22 AM  NPI: [id number]

## 2015-01-18 NOTE — Telephone Encounter (Signed)
LMTCB x 1 

## 2015-01-19 NOTE — Telephone Encounter (Signed)
Patient notified.  Nothing further needed. 

## 2015-01-19 NOTE — Telephone Encounter (Signed)
Patient returned call, may be reached [phone].

## 2015-01-22 DIAGNOSIS — G4733 Obstructive sleep apnea (adult) (pediatric): Secondary | ICD-10-CM | POA: Diagnosis not present

## 2015-06-07 ENCOUNTER — Other Ambulatory Visit: Payer: Self-pay | Admitting: Family

## 2015-06-07 NOTE — Telephone Encounter (Signed)
Last seen 12/19/14  Dr Livia Snellen

## 2015-07-12 ENCOUNTER — Other Ambulatory Visit: Payer: Self-pay | Admitting: Family Medicine

## 2015-07-12 NOTE — Telephone Encounter (Signed)
Last seen 12/19/14 Dr Livia Snellen   MMM PCP

## 2015-10-15 ENCOUNTER — Ambulatory Visit (INDEPENDENT_AMBULATORY_CARE_PROVIDER_SITE_OTHER): Payer: BC Managed Care – PPO | Admitting: Family Medicine

## 2015-10-15 ENCOUNTER — Encounter: Payer: Self-pay | Admitting: Family Medicine

## 2015-10-15 VITALS — BP 120/68 | HR 77 | Temp 98.3°F | Ht 68.0 in | Wt 210.6 lb

## 2015-10-15 DIAGNOSIS — G2581 Restless legs syndrome: Secondary | ICD-10-CM

## 2015-10-15 DIAGNOSIS — I872 Venous insufficiency (chronic) (peripheral): Secondary | ICD-10-CM | POA: Diagnosis not present

## 2015-10-15 DIAGNOSIS — I10 Essential (primary) hypertension: Secondary | ICD-10-CM

## 2015-10-15 DIAGNOSIS — Z0000 Encounter for general adult medical examination without abnormal findings: Secondary | ICD-10-CM | POA: Insufficient documentation

## 2015-10-15 MED ORDER — ROPINIROLE HCL 1 MG PO TABS: ORAL_TABLET | ORAL | Status: DC

## 2015-10-15 NOTE — Progress Notes (Signed)
   HPI  Patient presents today for follow-up and refill of medication.  Patient sees [facility] in [place], she is on several supplements further treatment, she is also being treated with several antibiotics for Lyme disease  Restless leg Requip is helping pretty well, she does get irritated with afternoon symptoms. She's been on the medication for over 10 years and feels comfortable with it. Needs refill.  Leg swelling Has been going on for several months to a few years, with slightly greater than the right States that she has worsening pitting edema at the end of the day, it's also still pitting of her Celexa up in the morning. She has had improvement with compression type stocking/TED hose in the past  She has no history of pelvic surgery    PMH: Smoking status noted ROS: Per HPI  Objective: BP 120/68 mmHg  Pulse 77  Temp(Src) 98.3 F (36.8 C) (Oral)  Ht _0  (1.727 m)  Wt 210 lb 9.6 oz (95.528 kg)  BMI 32.03 kg/m2 Gen: NAD, alert, cooperative with exam HEENT: NCAT CV: RRR, good S1/S2, no murmur Resp: CTABL, no wheezes, non-labored Ext:1+ pitting edema bilateral lower extremities, large varicose vein on the left calf with no erythema or tenderness to palpation  Neuro: Alert and oriented, No gross deficits  Assessment and plan:  # Chronic venous insufficiency Prescription written for compression stockings She is not taking Lasix  # Hypertension Polypharmacy, however as far as I can tell she is not on any antihypertensives. She has discontinued Lasix, this is being used for venous insufficiency/leg swelling She is on several supplements and has a large page of updates today.  # Restless leg syndrome Refilled Requip, pretty good relief with it  # Healthcare maintenance Mammogram is planned Sending to GI for colonoscopy Labs in a few days fasting    Orders Placed This Encounter  Procedures  . CMP14+EGFR    Standing Status: Future     Number of Occurrences:      Standing Expiration Date: 10/14/2016  . CBC with Differential    Standing Status: Future     Number of Occurrences:      Standing Expiration Date: 10/14/2016  . Lipid Panel    Standing Status: Future     Number of Occurrences:      Standing Expiration Date: 10/14/2016  . Ambulatory referral to Gastroenterology    Referral Priority:  Routine    Referral Type:  Consultation    Referral Reason:  Specialty Services Required    Number of Visits Requested:  1    Meds ordered this encounter  Medications  . rOPINIRole (REQUIP) 1 MG tablet    Sig: TAKE ONE (1) TABLET EACH night    Dispense:  90 tablet    Refill:  A[place], MD [facility] 10/15/2015, 2:32 PM

## 2015-10-15 NOTE — Patient Instructions (Signed)
Great to meet you!  I have refilled the requip  Try compression stockings for your leg swelling.

## 2015-11-02 ENCOUNTER — Other Ambulatory Visit: Payer: BC Managed Care – PPO

## 2015-11-02 DIAGNOSIS — I10 Essential (primary) hypertension: Secondary | ICD-10-CM

## 2015-11-03 LAB — LIPID PANEL
Chol/HDL Ratio: 2.9 ratio units (ref 0.0–4.4)
Cholesterol, Total: 196 mg/dL (ref 100–199)
HDL: 67 mg/dL (ref >39)
LDL Calculated: 111 mg/dL — ABNORMAL HIGH (ref 0–99)
Triglycerides: 92 mg/dL (ref 0–149)
VLDL Cholesterol Cal: 18 mg/dL (ref 5–40)

## 2015-11-03 LAB — CBC WITH DIFFERENTIAL/PLATELET
BASOS: 1 %
Basophils Absolute: 0 10*3/uL (ref 0.0–0.2)
EOS (ABSOLUTE): 0.2 10*3/uL (ref 0.0–0.4)
Eos: 5 %
Hematocrit: 41 % (ref 34.0–46.6)
Hemoglobin: 14.1 g/dL (ref 11.1–15.9)
Immature Grans (Abs): 0 10*3/uL (ref 0.0–0.1)
Immature Granulocytes: 0 %
Lymphocytes Absolute: 1.7 10*3/uL (ref 0.7–3.1)
Lymphs: 42 %
MCH: 27.9 pg (ref 26.6–33.0)
MCHC: 34.4 g/dL (ref 31.5–35.7)
MCV: 81 fL (ref 79–97)
MONOS ABS: 0.3 10*3/uL (ref 0.1–0.9)
Monocytes: 7 %
NEUTROS ABS: 1.9 10*3/uL (ref 1.4–7.0)
Neutrophils: 45 %
PLATELETS: 177 10*3/uL (ref 150–379)
RBC: 5.06 x10E6/uL (ref 3.77–5.28)
RDW: 13.3 % (ref 12.3–15.4)
WBC: 4.2 10*3/uL (ref 3.4–10.8)

## 2015-11-03 LAB — CMP14+EGFR
A/G RATIO: 2.4 — AB (ref 1.2–2.2)
ALT: 20 IU/L (ref 0–32)
AST: 19 IU/L (ref 0–40)
Albumin: 4.3 g/dL (ref 3.5–5.5)
Alkaline Phosphatase: 81 IU/L (ref 39–117)
BUN/Creatinine Ratio: 18 (ref 9–23)
BUN: 12 mg/dL (ref 6–24)
Bilirubin Total: 2.5 mg/dL — ABNORMAL HIGH (ref 0.0–1.2)
CALCIUM: 9.3 mg/dL (ref 8.7–10.2)
CO2: 25 mmol/L (ref 18–29)
Chloride: 99 mmol/L (ref 96–106)
Creatinine, Ser: 0.65 mg/dL (ref 0.57–1.00)
GFR, EST AFRICAN AMERICAN: 119 mL/min/{1.73_m2} (ref >59)
GFR, EST NON AFRICAN AMERICAN: 103 mL/min/{1.73_m2} (ref >59)
GLOBULIN, TOTAL: 1.8 g/dL (ref 1.5–4.5)
Glucose: 78 mg/dL (ref 65–99)
POTASSIUM: 4.4 mmol/L (ref 3.5–5.2)
SODIUM: 139 mmol/L (ref 134–144)
TOTAL PROTEIN: 6.1 g/dL (ref 6.0–8.5)

## 2015-11-05 ENCOUNTER — Other Ambulatory Visit: Payer: BC Managed Care – PPO

## 2015-11-05 ENCOUNTER — Telehealth: Payer: Self-pay | Admitting: Family Medicine

## 2015-11-05 ENCOUNTER — Other Ambulatory Visit: Payer: Self-pay | Admitting: Family Medicine

## 2015-11-05 NOTE — Telephone Encounter (Signed)
Patient aware of results.

## 2015-11-05 NOTE — Telephone Encounter (Signed)
Labs for hyperbili, see result note.   Candace Apple, MD [facility] 11/05/2015, 8:57 AM

## 2015-11-09 ENCOUNTER — Telehealth: Payer: Self-pay | Admitting: Family Medicine

## 2015-11-12 LAB — HEPATIC FUNCTION PANEL
ALT: 22 IU/L (ref 0–32)
AST: 15 IU/L (ref 0–40)
Albumin: 4.3 g/dL (ref 3.5–5.5)
Alkaline Phosphatase: 88 IU/L (ref 39–117)
BILIRUBIN TOTAL: 2.2 mg/dL — AB (ref 0.0–1.2)
BILIRUBIN, DIRECT: 0.41 mg/dL — AB (ref 0.00–0.40)
Total Protein: 6.1 g/dL (ref 6.0–8.5)

## 2015-11-12 LAB — CBC WITH DIFFERENTIAL/PLATELET
BASOS ABS: 0 10*3/uL (ref 0.0–0.2)
Basos: 1 %
EOS (ABSOLUTE): 0.2 10*3/uL (ref 0.0–0.4)
EOS: 4 %
HEMOGLOBIN: 13.3 g/dL (ref 11.1–15.9)
Hematocrit: 38.1 % (ref 34.0–46.6)
IMMATURE GRANS (ABS): 0 10*3/uL (ref 0.0–0.1)
IMMATURE GRANULOCYTES: 0 %
LYMPHS: 34 %
Lymphocytes Absolute: 1.8 10*3/uL (ref 0.7–3.1)
MCH: 27.9 pg (ref 26.6–33.0)
MCHC: 34.9 g/dL (ref 31.5–35.7)
MCV: 80 fL (ref 79–97)
Monocytes Absolute: 0.4 10*3/uL (ref 0.1–0.9)
Monocytes: 7 %
NEUTROS ABS: 2.9 10*3/uL (ref 1.4–7.0)
Neutrophils: 54 %
PLATELETS: 193 10*3/uL (ref 150–379)
RBC: 4.76 x10E6/uL (ref 3.77–5.28)
RDW: 14.3 % (ref 12.3–15.4)
WBC: 5.2 10*3/uL (ref 3.4–10.8)

## 2015-11-12 LAB — PATHOLOGIST SMEAR REVIEW
PATH REV PLTS: NORMAL
PATH REV RBC: NORMAL
PATH REV WBC: NORMAL

## 2015-11-12 LAB — RETICULOCYTES: Retic Ct Pct: 2.1 % (ref 0.6–2.6)

## 2015-11-12 LAB — PROTIME-INR

## 2015-11-12 NOTE — Telephone Encounter (Signed)
Can you please review labs from the 12th . Looks like bilirubin is still high and route to [employer] [facility]

## 2015-11-13 NOTE — Telephone Encounter (Signed)
Spoke with pt regarding results and she doesn't want to do any further evaluation or testing until she has completed the antibiotic she is currently taking for chronic Lyme disease. The doctor that is treating her for this states this could be the cause for the bilirubinemia and she would like to have the blood repeated after completion of the antibiotics. How long should she wait after completing the antibiotic to have blood redrawn.

## 2015-11-13 NOTE — Telephone Encounter (Signed)
Pt aware.

## 2015-11-13 NOTE — Telephone Encounter (Signed)
See result notes, unconjugated hyperbilirubinemia. Referring to GI  If she does not have a GI.    Work up negative so far, consider [place]. Likely drug effect vs gilbert's syndrome.   Laroy Apple, MD [facility] 11/13/2015, 7:40 AM

## 2015-11-13 NOTE — Telephone Encounter (Signed)
Recommend repeating 1 month after completion.   Laroy Apple, MD [facility] 11/13/2015, 11:12 AM

## 2015-12-24 ENCOUNTER — Telehealth: Payer: Self-pay | Admitting: Family Medicine

## 2015-12-24 NOTE — Telephone Encounter (Signed)
Scheduled at [facility]

## 2015-12-28 ENCOUNTER — Other Ambulatory Visit: Payer: BC Managed Care – PPO

## 2015-12-28 DIAGNOSIS — R17 Unspecified jaundice: Secondary | ICD-10-CM

## 2015-12-29 LAB — CMP14+EGFR
ALBUMIN: 4.3 g/dL (ref 3.5–5.5)
ALT: 20 IU/L (ref 0–32)
AST: 21 IU/L (ref 0–40)
Albumin/Globulin Ratio: 2.3 — ABNORMAL HIGH (ref 1.2–2.2)
Alkaline Phosphatase: 69 IU/L (ref 39–117)
BUN / CREAT RATIO: 23 (ref 9–23)
BUN: 15 mg/dL (ref 6–24)
Bilirubin Total: 0.5 mg/dL (ref 0.0–1.2)
CO2: 26 mmol/L (ref 18–29)
CREATININE: 0.64 mg/dL (ref 0.57–1.00)
Calcium: 9.3 mg/dL (ref 8.7–10.2)
Chloride: 102 mmol/L (ref 96–106)
GFR calc non Af Amer: 104 mL/min/{1.73_m2} (ref >59)
GFR, EST AFRICAN AMERICAN: 119 mL/min/{1.73_m2} (ref >59)
GLUCOSE: 104 mg/dL — AB (ref 65–99)
Globulin, Total: 1.9 g/dL (ref 1.5–4.5)
Potassium: 4.4 mmol/L (ref 3.5–5.2)
Sodium: 142 mmol/L (ref 134–144)
TOTAL PROTEIN: 6.2 g/dL (ref 6.0–8.5)

## 2016-07-15 DIAGNOSIS — Z96659 Presence of unspecified artificial knee joint: Secondary | ICD-10-CM | POA: Insufficient documentation

## 2016-07-15 DIAGNOSIS — Z8639 Personal history of other endocrine, nutritional and metabolic disease: Secondary | ICD-10-CM | POA: Insufficient documentation

## 2016-07-15 DIAGNOSIS — Z8669 Personal history of other diseases of the nervous system and sense organs: Secondary | ICD-10-CM | POA: Insufficient documentation

## 2016-07-15 NOTE — Progress Notes (Signed)
Office Visit Note  Patient: Candace Rodriguez             Date of Birth: April 06, 1966           MRN: [id number]             PCP: Kenn File, MD Referring: Timmothy Euler, MD Visit Date: 07/24/2016 Occupation: @GUAROCC @    Subjective:  Feet pain.   History of Present Illness: Candace Rodriguez is a 51 y.o. female with history of fibromyalgia osteoarthritis and disc disease. She reports that she has difficulty wearing shoes which have to be tied on top of her foot due to feet discomfort. She had bilateral partial knee replacement. She still continues to have some discomfort in the medial aspect of her knee joints. She has some stiffness in her hands. Lower back pain has been better, she's been going to chiropractor.  Activities of Daily Living:  Patient reports morning stiffness for 10 minutes.   Patient Denies nocturnal pain.  Difficulty dressing/grooming: Denies Difficulty climbing stairs: Denies Difficulty getting out of chair: Denies Difficulty using hands for taps, buttons, cutlery, and/or writing: Denies   Review of Systems  Constitutional: Negative for fatigue, night sweats, weight gain, weight loss and weakness.  HENT: Negative for mouth sores, trouble swallowing, trouble swallowing, mouth dryness and nose dryness.   Eyes: Negative for pain, redness, visual disturbance and dryness.  Respiratory: Negative for cough, shortness of breath and difficulty breathing.   Cardiovascular: Negative for chest pain, palpitations, hypertension, irregular heartbeat and swelling in legs/feet.  Gastrointestinal: Negative for blood in stool, constipation and diarrhea.  Endocrine: Negative for increased urination.  Genitourinary: Negative for vaginal dryness.  Musculoskeletal: Positive for arthralgias, joint pain and morning stiffness. Negative for joint swelling, muscle weakness and muscle tenderness.  Skin: Positive for rash. Negative for color change, hair loss, skin tightness,  ulcers and sensitivity to sunlight.       eczema on hands  Allergic/Immunologic: Negative for susceptible to infections.  Neurological: Negative for dizziness, memory loss and night sweats.  Hematological: Negative for swollen glands.  Psychiatric/Behavioral: Negative for depressed mood and sleep disturbance. The patient is not nervous/anxious.     PMFS History:  Patient Active Problem List   Diagnosis Date Noted  . History of migraine 07/15/2016  . History of hypothyroidism 07/15/2016  . History of partial knee replacement 07/15/2016  . Hyperbilirubinemia 11/05/2015  . Chronic venous insufficiency 10/15/2015  . Healthcare maintenance 10/15/2015  . OSA (obstructive sleep apnea) 10/25/2014  . PCOS (polycystic ovarian syndrome) 06/12/2014  . Peripheral edema 06/12/2014  . Obesity, morbid ([place]) 09/13/2012  . Dyslipidemia 09/13/2012  . Essential hypertension, benign 09/13/2012  . Degenerative joint disease 09/13/2012  . Hypothyroidism 09/13/2012  . Restless leg syndrome, controlled 09/13/2012  . Migraine headache 09/13/2012  . Gout 09/13/2012    Past Medical History:  Diagnosis Date  . Asthma   . Depression   . DJD (degenerative joint disease)   . Fractures    Left distal radius,  left patella  . Hyperlipidemia   . Hypertension   . Migraine   . Obesity   . OSA (obstructive sleep apnea)    [facility]  . Restless leg syndrome   . Sleep apnea   . Thyroid disease   . Tinnitus     Family History  Problem Relation Age of Onset  . Arthritis Mother   . Cancer Maternal Grandmother   . Heart disease Maternal Grandmother   .  Cancer Maternal Grandfather   . Cancer Paternal Grandmother   . Heart disease Paternal Grandfather   . Arthritis Father   . Hypertension Father    Past Surgical History:  Procedure Laterality Date  . CHOLECYSTECTOMY  10/2013  . JOINT REPLACEMENT Bilateral 10/2008    knee each partial replacement  . KNEE SURGERY    .  LAPAROSCOPIC ABDOMINAL EXPLORATION  06/1993  . PARTIAL GASTRECTOMY  [id number]  . WRIST SURGERY  09/1998   L   Social History   Social History Narrative  . No narrative on file     Objective: Vital Signs: BP 120/78   Pulse 78   Resp 16   Ht 5\' 8"  (1.727 m)   Wt 214 lb (97.1 kg)   BMI 32.54 kg/m    Physical Exam  Constitutional: She is oriented to person, place, and time. She appears well-developed and well-nourished.  HENT:  Head: Normocephalic and atraumatic.  Eyes: Conjunctivae and EOM are normal.  Neck: Normal range of motion.  Cardiovascular: Normal rate, regular rhythm, normal heart sounds and intact distal pulses.   Pulmonary/Chest: Effort normal and breath sounds normal.  Abdominal: Soft. Bowel sounds are normal.  Lymphadenopathy:    She has no cervical adenopathy.  Neurological: She is alert and oriented to person, place, and time.  Skin: Skin is warm and dry. Capillary refill takes less than 2 seconds. There is erythema.  Erythema on dorsum of bilateral hands  Psychiatric: She has a normal mood and affect. Her behavior is normal.  Nursing note and vitals reviewed.    Musculoskeletal Exam: C-spine and thoracic lumbar spine good range of motion. Shoulder joints elbow joints wrist joints are good range of motion. She has bilateral PIP/DIP thickening with no synovitis. She has good range of motion of her hip joints she has bilateral partial knee replacement which appears to be doing well without any warmth or swelling. Ankle joints MTPs PIPs DIPs with good range of motion with no synovitis.  CDAI Exam: No CDAI exam completed.    Investigation: Findings:  Labs 06-2010, uric acid was in the normal range at 7.1, RF was negative, HGB was elevated at 15.2, she said her PCP is planning on repeating that in two weeks, CMP normal, LFT's normal.  No visits with results within 6 Month(s) from this visit.  Latest known visit with results is:  Lab on 12/28/2015  Component  Date Value Ref Range Status  . Glucose 12/28/2015 104* 65 - 99 mg/dL Final  . BUN 12/28/2015 15  6 - 24 mg/dL Final  . Creatinine, Ser 12/28/2015 0.64  0.57 - 1.00 mg/dL Final  . GFR calc non Af Amer 12/28/2015 104  >59 mL/min/1.73 Final  . GFR calc Af Amer 12/28/2015 119  >59 mL/min/1.73 Final  . BUN/Creatinine Ratio 12/28/2015 23  9 - 23 Final  . Sodium 12/28/2015 142  134 - 144 mmol/L Final  . Potassium 12/28/2015 4.4  3.5 - 5.2 mmol/L Final  . Chloride 12/28/2015 102  96 - 106 mmol/L Final  . CO2 12/28/2015 26  18 - 29 mmol/L Final  . Calcium 12/28/2015 9.3  8.7 - 10.2 mg/dL Final  . Total Protein 12/28/2015 6.2  6.0 - 8.5 g/dL Final  . Albumin 12/28/2015 4.3  3.5 - 5.5 g/dL Final  . Globulin, Total 12/28/2015 1.9  1.5 - 4.5 g/dL Final  . Albumin/Globulin Ratio 12/28/2015 2.3* 1.2 - 2.2 Final  . Bilirubin Total 12/28/2015 0.5  0.0 - 1.2 mg/dL Final  .  Alkaline Phosphatase 12/28/2015 69  39 - 117 IU/L Final  . AST 12/28/2015 21  0 - 40 IU/L Final  . ALT 12/28/2015 20  0 - 32 IU/L Final     Imaging: No results found.  Speciality Comments: No specialty comments available.    Procedures:  No procedures performed Allergies: Avelox [moxifloxacin]; Gluten meal; Other; Penicillin g; Penicillins; Quinolones; and Soy allergy   Assessment / Plan:     Visit Diagnoses: Fibromyalgia: She continues to have some generalized pain and positive tender points for myalgias are better.  Primary osteoarthritis of both hands: Joint protection and muscle strengthening was discussed.  History of bilateral partial knee replacement: She has some discomfort over the medial aspect. It appears to be more myofascial. She uses topical Voltaren gel which is been helpful.  Spondylosis of lumbar region without myelopathy or radiculopathy: She has chronic pain. His stretching exercises and muscle strengthening was discussed.   Other medical problems are listed as follows: History of  hypertension  History of sleep apnea  Obesity, morbid ([place]): Weight loss diet and exercise was discussed.  History of restless legs syndrome  History of hypothyroidism  History of gastric sleeve  History of migraine    Orders: No orders of the defined types were placed in this encounter.  No orders of the defined types were placed in this encounter.   Face-to-face time spent with patient was 30 minutes. 50% of time was spent in counseling and coordination of care.  Follow-Up Instructions: Return in about 1 year (around 07/24/2017) for Osteoarthritis, FMS.   Bo Merino, MD  Note - This record has been created using Editor, commissioning.  Chart creation errors have been sought, but may not always  have been located. Such creation errors do not reflect on  the standard of medical care.

## 2016-07-24 ENCOUNTER — Encounter: Payer: Self-pay | Admitting: Rheumatology

## 2016-07-24 ENCOUNTER — Ambulatory Visit (INDEPENDENT_AMBULATORY_CARE_PROVIDER_SITE_OTHER): Payer: BC Managed Care – PPO | Admitting: Rheumatology

## 2016-07-24 VITALS — BP 120/78 | HR 78 | Resp 16 | Ht 68.0 in | Wt 214.0 lb

## 2016-07-24 DIAGNOSIS — M19041 Primary osteoarthritis, right hand: Secondary | ICD-10-CM | POA: Diagnosis not present

## 2016-07-24 DIAGNOSIS — M19042 Primary osteoarthritis, left hand: Secondary | ICD-10-CM | POA: Diagnosis not present

## 2016-07-24 DIAGNOSIS — Z96659 Presence of unspecified artificial knee joint: Secondary | ICD-10-CM | POA: Diagnosis not present

## 2016-07-24 DIAGNOSIS — Z8679 Personal history of other diseases of the circulatory system: Secondary | ICD-10-CM

## 2016-07-24 DIAGNOSIS — M47816 Spondylosis without myelopathy or radiculopathy, lumbar region: Secondary | ICD-10-CM | POA: Diagnosis not present

## 2016-07-24 DIAGNOSIS — M797 Fibromyalgia: Secondary | ICD-10-CM | POA: Diagnosis not present

## 2016-07-24 DIAGNOSIS — Z8669 Personal history of other diseases of the nervous system and sense organs: Secondary | ICD-10-CM | POA: Diagnosis not present

## 2016-07-30 ENCOUNTER — Other Ambulatory Visit: Payer: Self-pay | Admitting: Family Medicine

## 2016-10-28 ENCOUNTER — Ambulatory Visit (INDEPENDENT_AMBULATORY_CARE_PROVIDER_SITE_OTHER): Payer: BC Managed Care – PPO | Admitting: Family Medicine

## 2016-10-28 ENCOUNTER — Encounter: Payer: Self-pay | Admitting: Family Medicine

## 2016-10-28 VITALS — BP 122/71 | HR 73 | Temp 98.2°F | Ht 68.0 in | Wt 216.0 lb

## 2016-10-28 DIAGNOSIS — G2581 Restless legs syndrome: Secondary | ICD-10-CM

## 2016-10-28 DIAGNOSIS — E785 Hyperlipidemia, unspecified: Secondary | ICD-10-CM | POA: Diagnosis not present

## 2016-10-28 MED ORDER — ROPINIROLE HCL 1 MG PO TABS
1.0000 mg | ORAL_TABLET | Freq: Every day | ORAL | 3 refills | Status: DC
Start: 2016-10-28 — End: 2017-09-04

## 2016-10-28 NOTE — Progress Notes (Signed)
   HPI  Patient presents today for general follow-up of chronic medical conditions.  Hyperlipidemia Patient previously had hyperlipidemia treated with medication, she had bariatric surgery and good amount of weight loss. It's now been managed with diet.  Restless leg syndrome Managed moderately well with Requip 1 mg, needs refill. Patient is been on this medication for years, she does not like change at this time.  PMH: Smoking status noted ROS: Per HPI  Objective: BP 122/71   Pulse 73   Temp 98.2 F (36.8 C) (Oral)   Ht '5\' 8"'$  (1.727 m)   Wt 216 lb (98 kg)   BMI 32.84 kg/m  Gen: NAD, alert, cooperative with exam HEENT: NCAT CV: RRR, good S1/S2, no murmur Resp: CTABL, no wheezes, non-labored Ext: No edema, warm Neuro: Alert and oriented, No gross deficits  Assessment and plan:  # Dyslipidemia Repeat labs, fasting, patient will return. Diet controlled  # Restless leg syndrome Refill Requip, controlled     Orders Placed This Encounter  Procedures  . CBC with Differential/Platelet  . CMP14+EGFR  . Lipid panel    Meds ordered this encounter  Medications  . OVER THE COUNTER MEDICATION    Sig: Beauty bursts  . OVER THE COUNTER MEDICATION    Sig: boswella 250 mg BID  . rOPINIRole (REQUIP) 1 MG tablet    Sig: Take 1 tablet (1 mg total) by mouth at bedtime.    Dispense:  90 tablet    Refill:  [place], MD [facility] 10/28/2016, 5:07 PM

## 2016-10-28 NOTE — Patient Instructions (Addendum)
Great to see you!  For you I would recommend a Dexa at 50 years old.

## 2016-11-01 ENCOUNTER — Other Ambulatory Visit: Payer: BC Managed Care – PPO

## 2016-11-05 ENCOUNTER — Other Ambulatory Visit: Payer: BC Managed Care – PPO

## 2016-11-05 DIAGNOSIS — E785 Hyperlipidemia, unspecified: Secondary | ICD-10-CM

## 2016-11-06 LAB — CBC WITH DIFFERENTIAL/PLATELET
BASOS: 1 %
Basophils Absolute: 0 10*3/uL (ref 0.0–0.2)
EOS (ABSOLUTE): 0.2 10*3/uL (ref 0.0–0.4)
Eos: 4 %
HEMOGLOBIN: 14.7 g/dL (ref 11.1–15.9)
Hematocrit: 41.5 % (ref 34.0–46.6)
Immature Grans (Abs): 0 10*3/uL (ref 0.0–0.1)
Immature Granulocytes: 0 %
Lymphocytes Absolute: 1.9 10*3/uL (ref 0.7–3.1)
Lymphs: 44 %
MCH: 27.6 pg (ref 26.6–33.0)
MCHC: 35.4 g/dL (ref 31.5–35.7)
MCV: 78 fL — AB (ref 79–97)
MONOS ABS: 0.3 10*3/uL (ref 0.1–0.9)
Monocytes: 7 %
NEUTROS ABS: 1.9 10*3/uL (ref 1.4–7.0)
NEUTROS PCT: 44 %
Platelets: 174 10*3/uL (ref 150–379)
RBC: 5.32 x10E6/uL — ABNORMAL HIGH (ref 3.77–5.28)
RDW: 14.6 % (ref 12.3–15.4)
WBC: 4.2 10*3/uL (ref 3.4–10.8)

## 2016-11-06 LAB — CMP14+EGFR
ALT: 27 IU/L (ref 0–32)
AST: 16 IU/L (ref 0–40)
Albumin/Globulin Ratio: 2.2 (ref 1.2–2.2)
Albumin: 4.1 g/dL (ref 3.5–5.5)
Alkaline Phosphatase: 72 IU/L (ref 39–117)
BILIRUBIN TOTAL: 1 mg/dL (ref 0.0–1.2)
BUN/Creatinine Ratio: 21 (ref 9–23)
BUN: 11 mg/dL (ref 6–24)
CALCIUM: 8.9 mg/dL (ref 8.7–10.2)
CO2: 25 mmol/L (ref 20–29)
Chloride: 102 mmol/L (ref 96–106)
Creatinine, Ser: 0.52 mg/dL — ABNORMAL LOW (ref 0.57–1.00)
GFR, EST AFRICAN AMERICAN: 127 mL/min/{1.73_m2} (ref >59)
GFR, EST NON AFRICAN AMERICAN: 110 mL/min/{1.73_m2} (ref >59)
GLOBULIN, TOTAL: 1.9 g/dL (ref 1.5–4.5)
Glucose: 79 mg/dL (ref 65–99)
POTASSIUM: 3.9 mmol/L (ref 3.5–5.2)
Sodium: 141 mmol/L (ref 134–144)
Total Protein: 6 g/dL (ref 6.0–8.5)

## 2016-11-06 LAB — LIPID PANEL
CHOLESTEROL TOTAL: 200 mg/dL — AB (ref 100–199)
Chol/HDL Ratio: 3.3 ratio (ref 0.0–4.4)
HDL: 61 mg/dL (ref >39)
LDL CALC: 122 mg/dL — AB (ref 0–99)
TRIGLYCERIDES: 85 mg/dL (ref 0–149)
VLDL Cholesterol Cal: 17 mg/dL (ref 5–40)

## 2016-11-28 ENCOUNTER — Ambulatory Visit (INDEPENDENT_AMBULATORY_CARE_PROVIDER_SITE_OTHER): Payer: BC Managed Care – PPO | Admitting: Family

## 2016-11-28 ENCOUNTER — Encounter: Payer: Self-pay | Admitting: Family

## 2016-11-28 VITALS — BP 130/82 | HR 75 | Temp 98.7°F | Ht 68.0 in | Wt 221.6 lb

## 2016-11-28 DIAGNOSIS — H8110 Benign paroxysmal vertigo, unspecified ear: Secondary | ICD-10-CM

## 2016-11-28 DIAGNOSIS — J301 Allergic rhinitis due to pollen: Secondary | ICD-10-CM | POA: Diagnosis not present

## 2016-11-28 MED ORDER — MECLIZINE HCL 25 MG PO TABS: 25.0000 mg | ORAL_TABLET | Freq: Three times a day (TID) | ORAL | 0 refills | Status: DC | PRN

## 2016-11-28 MED ORDER — FLUTICASONE PROPIONATE 50 MCG/ACT NA SUSP: 2.0000 | Freq: Every day | NASAL | 6 refills | Status: DC

## 2016-11-28 NOTE — Progress Notes (Signed)
   Subjective:    Patient ID: Candace Rodriguez, female    DOB: 23-Apr-1966, 51 y.o.   MRN: [id number]  Pt presents to the office today with dizziness and sinus pain. Pt is currently taking clindamycin and doxycycline for lyme and is on day 11.  Dizziness  The current episode started in the past 7 days. The problem occurs intermittently. The problem has been gradually worsening. Associated symptoms include congestion, headaches and nausea. Pertinent negatives include no chills, coughing, fever, sore throat, swollen glands, urinary symptoms, visual change, vomiting or weakness. Associated symptoms comments: Ear pain . The symptoms are aggravated by bending. She has tried nothing for the symptoms. The treatment provided no relief.  Headache   Associated symptoms include dizziness and nausea. Pertinent negatives include no coughing, fever, sore throat, swollen glands, visual change, vomiting or weakness.      Review of Systems  Constitutional: Negative for chills and fever.  HENT: Positive for congestion. Negative for sore throat.   Respiratory: Negative for cough.   Gastrointestinal: Positive for nausea. Negative for vomiting.  Neurological: Positive for dizziness and headaches. Negative for weakness.  All other systems reviewed and are negative.      Objective:   Physical Exam  Constitutional: She is oriented to person, place, and time. She appears well-developed and well-nourished. No distress.  HENT:  Head: Normocephalic and atraumatic.  Right Ear: External ear normal.  Left Ear: External ear normal. A middle ear effusion is present.  Nose: Mucosal edema and rhinorrhea present. Right sinus exhibits maxillary sinus tenderness. Left sinus exhibits maxillary sinus tenderness.  Mouth/Throat: Posterior oropharyngeal erythema present.  Eyes: Pupils are equal, round, and reactive to light.  Neck: Normal range of motion. Neck supple. No thyromegaly present.  Cardiovascular: Normal rate,  regular rhythm, normal heart sounds and intact distal pulses.   No murmur heard. Pulmonary/Chest: Effort normal and breath sounds normal. No respiratory distress. She has no wheezes.  Abdominal: Soft. Bowel sounds are normal. She exhibits no distension. There is no tenderness.  Musculoskeletal: Normal range of motion. She exhibits no edema or tenderness.  Neurological: She is alert and oriented to person, place, and time.  Skin: Skin is warm and dry.  Psychiatric: She has a normal mood and affect. Her behavior is normal. Judgment and thought content normal.  Vitals reviewed.     BP 130/82   Pulse 75   Temp 98.7 F (37.1 C) (Oral)   Ht 5\' 8"  (1.727 m)   Wt 221 lb 9.6 oz (100.5 kg)   BMI 33.69 kg/m      Assessment & Plan:  1. Benign paroxysmal positional vertigo, unspecified laterality - fluticasone (FLONASE) 50 MCG/ACT nasal spray; Place 2 sprays into both nostrils daily.  Dispense: 16 g; Refill: 6 - meclizine (ANTIVERT) 25 MG tablet; Take 1 tablet (25 mg total) by mouth 3 (three) times daily as needed for dizziness.  Dispense: 30 tablet; Refill: 0  2. Seasonal allergic rhinitis due to pollen - fluticasone (FLONASE) 50 MCG/ACT nasal spray; Place 2 sprays into both nostrils daily.  Dispense: 16 g; Refill: 6  Falls risks discussed Continue antibiotics  Epley Maneuver discussed RTO prn   Evelina Dun, FNP

## 2016-11-28 NOTE — Patient Instructions (Signed)

## 2017-02-28 ENCOUNTER — Ambulatory Visit (INDEPENDENT_AMBULATORY_CARE_PROVIDER_SITE_OTHER): Payer: BC Managed Care – PPO | Admitting: Family Medicine

## 2017-02-28 ENCOUNTER — Encounter: Payer: Self-pay | Admitting: Family Medicine

## 2017-02-28 ENCOUNTER — Other Ambulatory Visit: Payer: Self-pay

## 2017-02-28 VITALS — BP 121/74 | HR 78 | Temp 98.2°F | Ht 68.0 in | Wt 214.0 lb

## 2017-02-28 DIAGNOSIS — J329 Chronic sinusitis, unspecified: Secondary | ICD-10-CM | POA: Diagnosis not present

## 2017-02-28 DIAGNOSIS — J40 Bronchitis, not specified as acute or chronic: Secondary | ICD-10-CM | POA: Diagnosis not present

## 2017-02-28 MED ORDER — HYDROCODONE-HOMATROPINE 5-1.5 MG PO TABS: ORAL_TABLET | ORAL | 0 refills | Status: DC

## 2017-02-28 MED ORDER — PSEUDOEPHEDRINE-GUAIFENESIN ER 60-600 MG PO TB12: 1.0000 | ORAL_TABLET | Freq: Two times a day (BID) | ORAL | 0 refills | Status: AC

## 2017-02-28 MED ORDER — SULFAMETHOXAZOLE-TRIMETHOPRIM 800-160 MG PO TABS: 1.0000 | ORAL_TABLET | Freq: Two times a day (BID) | ORAL | 0 refills | Status: DC

## 2017-02-28 NOTE — Progress Notes (Signed)
Chief Complaint  Patient presents with  . Sinusitis  . Cough    HPI  Patient presents today for Patient presents with upper respiratory congestion. Rhinorrhea that is frequently purulent. Pressure in the cheeks and right earThere is moderate sore throat. Patient reports coughing frequently as well.  Green sputum noted. There is no fever, chills or sweats. The patient denies being short of breath. Onset was 3 1/2 days ago. Gradually worsening. Tried OTC mucinex cough med without improvement.   PMH: Smoking status noted ROS: Per HPI  Objective: BP 121/74   Pulse 78   Temp 98.2 F (36.8 C) (Oral)   Ht 5\' 8"  (1.727 m)   Wt 214 lb (97.1 kg)   BMI 32.54 kg/m  Gen: NAD, alert, cooperative with exam HEENT: NCAT, EOMI, PERRLTMs are clear. Hearing aids in place, removed for exam. There is a little bit of wax on the right. The pharynx has erythema with drainage tracts. The maxillary sinuses are tender to percussion CV: RRR, good S1/S2, no murmur Resp: Few scattered wheezes, non-labored with bronchitic/bronchoalveolar changes noted. Abd: SNTND, BS present, no guarding or organomegaly Ext: No edema, warm Neuro: Alert and oriented, No gross deficits  Assessment and plan:  1. Sinobronchitis     Meds ordered this encounter  Medications  . sulfamethoxazole-trimethoprim (BACTRIM DS,SEPTRA DS) 800-160 MG tablet    Sig: Take 1 tablet by mouth 2 (two) times daily. Until gone, for infection    Dispense:  20 tablet    Refill:  0  . pseudoephedrine-guaifenesin (MUCINEX D) 60-600 MG 12 hr tablet    Sig: Take 1 tablet by mouth every 12 (twelve) hours. As needed for congestion    Dispense:  20 tablet    Refill:  0      Follow up as needed.  Claretta Fraise, MD

## 2017-07-09 NOTE — Progress Notes (Deleted)
Office Visit Note  Patient: Candace Rodriguez             Date of Birth: 03-May-1966           MRN: [id number]             PCP: Timmothy Euler, MD Referring: Timmothy Euler, MD Visit Date: 07/23/2017 Occupation: @GUAROCC @    Subjective:  No chief complaint on file.   History of Present Illness: Candace Rodriguez is a 52 y.o. female ***   Activities of Daily Living:  Patient reports morning stiffness for *** {minute/hour:19697}.   Patient {ACTIONS;DENIES/REPORTS:21021675::"Denies"} nocturnal pain.  Difficulty dressing/grooming: {ACTIONS;DENIES/REPORTS:21021675::"Denies"} Difficulty climbing stairs: {ACTIONS;DENIES/REPORTS:21021675::"Denies"} Difficulty getting out of chair: {ACTIONS;DENIES/REPORTS:21021675::"Denies"} Difficulty using hands for taps, buttons, cutlery, and/or writing: {ACTIONS;DENIES/REPORTS:21021675::"Denies"}   No Rheumatology ROS completed.   PMFS History:  Patient Active Problem List   Diagnosis Date Noted  . History of restless legs syndrome 07/24/2016  . History of migraine 07/15/2016  . History of hypothyroidism 07/15/2016  . History of partial knee replacement 07/15/2016  . Hyperbilirubinemia 11/05/2015  . Chronic venous insufficiency 10/15/2015  . Healthcare maintenance 10/15/2015  . OSA (obstructive sleep apnea) 10/25/2014  . PCOS (polycystic ovarian syndrome) 06/12/2014  . Peripheral edema 06/12/2014  . Obesity, morbid ([place]) 09/13/2012  . Dyslipidemia 09/13/2012  . Essential hypertension, benign 09/13/2012  . Degenerative joint disease 09/13/2012  . Hypothyroidism 09/13/2012  . Restless leg syndrome, controlled 09/13/2012  . Migraine headache 09/13/2012  . Gout 09/13/2012    Past Medical History:  Diagnosis Date  . Asthma   . Depression   . DJD (degenerative joint disease)   . Fractures    Left distal radius,  left patella  . Hyperlipidemia   . Hypertension   . Migraine   . Obesity   . OSA (obstructive sleep apnea)    [facility]x [facility]  . Restless leg syndrome   . Sleep apnea   . Thyroid disease   . Tinnitus     Family History  Problem Relation Age of Onset  . Arthritis Mother   . Cancer Maternal Grandmother   . Heart disease Maternal Grandmother   . Cancer Maternal Grandfather   . Cancer Paternal Grandmother   . Heart disease Paternal Grandfather   . Arthritis Father   . Hypertension Father    Past Surgical History:  Procedure Laterality Date  . CHOLECYSTECTOMY  10/2013  . JOINT REPLACEMENT Bilateral 10/2008    knee each partial replacement  . KNEE SURGERY    . LAPAROSCOPIC ABDOMINAL EXPLORATION  06/1993  . PARTIAL GASTRECTOMY  [id number]  . WRIST SURGERY  09/1998   L   Social History   Social History Narrative  . Not on file     Objective: Vital Signs: There were no vitals taken for this visit.   Physical Exam   Musculoskeletal Exam: ***  CDAI Exam: No CDAI exam completed.    Investigation: No additional findings. CBC Latest Ref Rng & Units 11/05/2016 11/05/2015 11/02/2015  WBC 3.4 - 10.8 x10E3/uL 4.2 5.2 4.2  Hemoglobin 11.1 - 15.9 g/dL 14.7 13.3 14.1  Hematocrit 34.0 - 46.6 % 41.5 38.1 41.0  Platelets 150 - 379 x10E3/uL 174 193 177   CMP Latest Ref Rng & Units 11/05/2016 12/28/2015 11/05/2015  Glucose 65 - 99 mg/dL 79 104(H) -  BUN 6 - 24 mg/dL 11 15 -  Creatinine 0.57 - 1.00 mg/dL 0.52(L) 0.64 -  Sodium 134 - 144 mmol/L 141 142 -  Potassium 3.5 - 5.2 mmol/L 3.9 4.4 -  Chloride 96 - 106 mmol/L 102 102 -  CO2 20 - 29 mmol/L 25 26 -  Calcium 8.7 - 10.2 mg/dL 8.9 9.3 -  Total Protein 6.0 - 8.5 g/dL 6.0 6.2 6.1  Total Bilirubin 0.0 - 1.2 mg/dL 1.0 0.5 2.2(H)  Alkaline Phos 39 - 117 IU/L 72 69 88  AST 0 - 40 IU/L 16 21 15   ALT 0 - 32 IU/L 27 20 22     Imaging: No results found.  Speciality Comments: No specialty comments available.    Procedures:  No procedures performed Allergies: Avelox [moxifloxacin]; Gluten meal; Other; Penicillin g; Penicillins;  Quinolones; and Soy allergy   Assessment / Plan:     Visit Diagnoses: No diagnosis found.    Orders: No orders of the defined types were placed in this encounter.  No orders of the defined types were placed in this encounter.   Face-to-face time spent with patient was *** minutes. 50% of time was spent in counseling and coordination of care.  Follow-Up Instructions: No Follow-up on file.   Earnestine Mealing, CMA  Note - This record has been created using Editor, commissioning.  Chart creation errors have been sought, but may not always  have been located. Such creation errors do not reflect on  the standard of medical care.

## 2017-07-23 ENCOUNTER — Ambulatory Visit: Payer: BC Managed Care – PPO | Admitting: Rheumatology

## 2017-07-29 NOTE — Progress Notes (Signed)
Office Visit Note  Patient: Candace Rodriguez             Date of Birth: 1966/02/23           MRN: [id number]             PCP: Timmothy Euler, MD Referring: Timmothy Euler, MD Visit Date: 08/11/2017 Occupation: @GUAROCC @    Subjective:  Left knee pain    History of Present Illness: Candace Rodriguez is a 52 y.o. female with history of gout, fibromyalgia, osteoarthritis.  Patient states that she has been having some increased pain in her left knee for about 2 weeks.  She states the pain is most severe under her kneecap.  She states she had a partial knee replacement with Dr. Alvan Dame about 9 years ago.  She states that she has noticed some swelling in her left knee.  She states her right knee has been doing well.  She states she has been having increased pain since starting to eat gluten again.  She states she also uses essential oils and voltaren gel on her knee.  Patient states she continues to have stiffness in her bilateral hands especially in the morning.  She states she occasionally has swelling in her hands.  She states that she wears bilateral carpal tunnel braces at night which helps with her pain.  She states that her pain does not inhibit her from doing her daily activities and knitting.  She states she has chronic lower back pain.  She sees a Restaurant manager, fast food once a week.  She has some stiffness in her lower back especially first thing in the morning.  She denies any recent gout flares.  She does not take any medications for management of gout.  She states that her fibromyalgia has been very stable.  She does have some trapezius muscle tension and muscle tenderness.  She continues to have insomnia and takes zquil at bedtime.  She continues to have Raynaud's in her hands.  She denies any digital ulcerations.     Activities of Daily Living:  Patient reports morning stiffness for 30 minutes.   Patient Denies nocturnal pain.  Difficulty dressing/grooming: Denies Difficulty  climbing stairs: Reports Difficulty getting out of chair: Reports Difficulty using hands for taps, buttons, cutlery, and/or writing: Denies   Review of Systems  Constitutional: Positive for fatigue. Negative for weakness.  HENT: Negative for mouth sores, trouble swallowing, trouble swallowing, mouth dryness and nose dryness.   Eyes: Negative for pain, redness, visual disturbance and dryness.  Respiratory: Negative for cough, hemoptysis, shortness of breath and difficulty breathing.   Cardiovascular: Negative for chest pain, palpitations, hypertension, irregular heartbeat and swelling in legs/feet.  Gastrointestinal: Positive for diarrhea. Negative for blood in stool and constipation.  Endocrine: Negative for increased urination.  Genitourinary: Negative for painful urination.  Musculoskeletal: Positive for arthralgias, joint pain, joint swelling and morning stiffness. Negative for myalgias, muscle weakness, muscle tenderness and myalgias.  Skin: Positive for color change. Negative for rash, hair loss, nodules/bumps, redness, skin tightness, ulcers and sensitivity to sunlight.  Neurological: Negative for dizziness, numbness and headaches.  Hematological: Negative for swollen glands.  Psychiatric/Behavioral: Negative for depressed mood and sleep disturbance. The patient is not nervous/anxious.     PMFS History:  Patient Active Problem List   Diagnosis Date Noted  . History of restless legs syndrome 07/24/2016  . History of migraine 07/15/2016  . History of hypothyroidism 07/15/2016  . History of partial knee replacement 07/15/2016  .  Hyperbilirubinemia 11/05/2015  . Chronic venous insufficiency 10/15/2015  . Healthcare maintenance 10/15/2015  . OSA (obstructive sleep apnea) 10/25/2014  . PCOS (polycystic ovarian syndrome) 06/12/2014  . Peripheral edema 06/12/2014  . Obesity, morbid ([place]) 09/13/2012  . Dyslipidemia 09/13/2012  . Essential hypertension, benign 09/13/2012  .  Degenerative joint disease 09/13/2012  . Hypothyroidism 09/13/2012  . Restless leg syndrome, controlled 09/13/2012  . Migraine headache 09/13/2012  . Gout 09/13/2012    Past Medical History:  Diagnosis Date  . Asthma   . Depression   . DJD (degenerative joint disease)   . Fractures    Left distal radius,  left patella  . Hyperlipidemia   . Hypertension   . Migraine   . Obesity   . OSA (obstructive sleep apnea)    Dx by [facility]  . Restless leg syndrome   . Sleep apnea   . Thyroid disease   . Tinnitus     Family History  Problem Relation Age of Onset  . Arthritis Mother   . Cancer Maternal Grandmother   . Heart disease Maternal Grandmother   . Cancer Maternal Grandfather   . Cancer Paternal Grandmother   . Heart disease Paternal Grandfather   . Arthritis Father   . Hypertension Father   . Atrial fibrillation Brother    Past Surgical History:  Procedure Laterality Date  . CHOLECYSTECTOMY  10/2013  . JOINT REPLACEMENT Bilateral 10/2008    knee each partial replacement  . KNEE SURGERY    . LAPAROSCOPIC ABDOMINAL EXPLORATION  06/1993  . PARTIAL GASTRECTOMY  11/16/2012   VSG   . WRIST SURGERY  09/1998   L   Social History   Social History Narrative  . Not on file     Objective: Vital Signs: BP 112/74 (BP Location: Left Arm, Patient Position: Sitting, Cuff Size: Normal)   Pulse 89   Resp 16   Ht 5\' 8"  (1.727 m)   Wt 200 lb (90.7 kg)   BMI 30.41 kg/m    Physical Exam  Constitutional: She is oriented to person, place, and time. She appears well-developed and well-nourished.  HENT:  Head: Normocephalic and atraumatic.  Eyes: Conjunctivae and EOM are normal.  Neck: Normal range of motion.  Cardiovascular: Normal rate, regular rhythm, normal heart sounds and intact distal pulses.  Pulmonary/Chest: Effort normal and breath sounds normal.  Abdominal: Soft. Bowel sounds are normal.  Lymphadenopathy:    She has no cervical adenopathy.    Neurological: She is alert and oriented to person, place, and time.  Skin: Skin is warm and dry. Capillary refill takes less than 2 seconds.  Psychiatric: She has a normal mood and affect. Her behavior is normal.  Nursing note and vitals reviewed.    Musculoskeletal Exam: C-spine, thoracic spine, lumbar spine good range of motion.  No midline spinal tenderness.  No SI joint tenderness.  Shoulder joints, elbow joints, wrist joints, MCPs, PIPs, DIPs good range of motion with no synovitis.  She has PIP and DIP synovial thickening consistent with osteoarthritis.  Hip joints, knee joints, ankle joints, MTPs, PIPs, DIPs good range of motion with no synovitis.  She has mild warmth of bilateral knees.  No knee effusions.  No knee crepitus.  No tenderness of trochanteric bursa.  CDAI Exam: No CDAI exam completed.    Investigation: No additional findings. CBC Latest Ref Rng & Units 11/05/2016 11/05/2015 11/02/2015  WBC 3.4 - 10.8 x10E3/uL 4.2 5.2 4.2  Hemoglobin 11.1 - 15.9 g/dL 14.7  13.3 14.1  Hematocrit 34.0 - 46.6 % 41.5 38.1 41.0  Platelets 150 - 379 x10E3/uL 174 193 177   CMP Latest Ref Rng & Units 11/05/2016 12/28/2015 11/05/2015  Glucose 65 - 99 mg/dL 79 104(H) -  BUN 6 - 24 mg/dL 11 15 -  Creatinine 0.57 - 1.00 mg/dL 0.52(L) 0.64 -  Sodium 134 - 144 mmol/L 141 142 -  Potassium 3.5 - 5.2 mmol/L 3.9 4.4 -  Chloride 96 - 106 mmol/L 102 102 -  CO2 20 - 29 mmol/L 25 26 -  Calcium 8.7 - 10.2 mg/dL 8.9 9.3 -  Total Protein 6.0 - 8.5 g/dL 6.0 6.2 6.1  Total Bilirubin 0.0 - 1.2 mg/dL 1.0 0.5 2.2(H)  Alkaline Phos 39 - 117 IU/L 72 69 88  AST 0 - 40 IU/L 16 21 15   ALT 0 - 32 IU/L 27 20 22     Imaging: No results found.  Speciality Comments: No specialty comments available.    Procedures:  No procedures performed Allergies: Avelox [moxifloxacin]; Gluten meal; Other; Penicillin g; Penicillins; Quinolones; and Soy allergy   Assessment / Plan:     Visit Diagnoses: Idiopathic chronic gout of  multiple sites without tophus -She has not had any recent flares of gout.  She is not on any medications for management of gout.  Fibromyalgia: Her fibromyalgia has been stable.  She continues to have intermittent muscle tenderness and muscle aches.  She uses essential oils and avoids gluten, which helps with her pain level.  She also uses Voltaren gel as well. She was encouraged to exercise on a regular basis.  She continues to have chronic fatigue and insomnia.  She takes zquil at bedtime which helps with her insomnia.  Primary osteoarthritis of both hands: She has PIP and DIP synovial thickening consistent with osteoarthritis.  She has no synovitis or tenderness on exam.  Joint protection and muscle strengthening were discussed.  History of partial knee replacement - Bilateral: She has mild warmth of bilateral partial knee replacements.  Her left knee has been causing discomfort under the patella.  She was advised to use Voltaren gel on her left knee.   Spondylosis of lumbar region without myelopathy or radiculopathy: Chronic pain stiffness.  No midline spinal tenderness.  Other medical conditions are listed as follows:   History of sleep apnea  History of restless legs syndrome  History of hypothyroidism  History of migraine  History of bariatric surgery    Orders: No orders of the defined types were placed in this encounter.  No orders of the defined types were placed in this encounter.    Follow-Up Instructions: Return in about 1 year (around 08/12/2018) for Fibromyalgia, Gout, Osteoarthritis.   Ofilia Neas, PA-C   I examined and evaluated the patient with Hazel Sams PA. The plan of care was discussed as noted above.  Bo Merino, MD  Note - This record has been created using Editor, commissioning.  Chart creation errors have been sought, but may not always  have been located. Such creation errors do not reflect on  the standard of medical care.

## 2017-08-11 ENCOUNTER — Encounter: Payer: Self-pay | Admitting: Physician Assistant

## 2017-08-11 ENCOUNTER — Ambulatory Visit: Payer: BC Managed Care – PPO | Admitting: Physician Assistant

## 2017-08-11 VITALS — BP 112/74 | HR 89 | Resp 16 | Ht 68.0 in | Wt 200.0 lb

## 2017-08-11 DIAGNOSIS — M19042 Primary osteoarthritis, left hand: Secondary | ICD-10-CM

## 2017-08-11 DIAGNOSIS — Z8669 Personal history of other diseases of the nervous system and sense organs: Secondary | ICD-10-CM

## 2017-08-11 DIAGNOSIS — M47816 Spondylosis without myelopathy or radiculopathy, lumbar region: Secondary | ICD-10-CM

## 2017-08-11 DIAGNOSIS — M797 Fibromyalgia: Secondary | ICD-10-CM

## 2017-08-11 DIAGNOSIS — Z96659 Presence of unspecified artificial knee joint: Secondary | ICD-10-CM | POA: Diagnosis not present

## 2017-08-11 DIAGNOSIS — M19041 Primary osteoarthritis, right hand: Secondary | ICD-10-CM | POA: Diagnosis not present

## 2017-08-11 DIAGNOSIS — M1A09X0 Idiopathic chronic gout, multiple sites, without tophus (tophi): Secondary | ICD-10-CM | POA: Diagnosis not present

## 2017-08-11 DIAGNOSIS — Z8639 Personal history of other endocrine, nutritional and metabolic disease: Secondary | ICD-10-CM | POA: Diagnosis not present

## 2017-08-11 DIAGNOSIS — Z9884 Bariatric surgery status: Secondary | ICD-10-CM

## 2017-09-04 ENCOUNTER — Encounter: Payer: Self-pay | Admitting: Family Medicine

## 2017-09-04 ENCOUNTER — Ambulatory Visit: Payer: BC Managed Care – PPO | Admitting: Family Medicine

## 2017-09-04 VITALS — BP 116/74 | HR 75 | Temp 97.8°F | Ht 68.0 in | Wt 199.0 lb

## 2017-09-04 DIAGNOSIS — G2581 Restless legs syndrome: Secondary | ICD-10-CM

## 2017-09-04 DIAGNOSIS — J0110 Acute frontal sinusitis, unspecified: Secondary | ICD-10-CM | POA: Diagnosis not present

## 2017-09-04 DIAGNOSIS — E785 Hyperlipidemia, unspecified: Secondary | ICD-10-CM | POA: Diagnosis not present

## 2017-09-04 MED ORDER — CEFDINIR 300 MG PO CAPS: 300.0000 mg | ORAL_CAPSULE | Freq: Two times a day (BID) | ORAL | 0 refills | Status: DC

## 2017-09-04 MED ORDER — METHYLPREDNISOLONE ACETATE 80 MG/ML IJ SUSP
80.0000 mg | Freq: Once | INTRAMUSCULAR | Status: AC
  Administered 2017-09-04: 80 mg via INTRAMUSCULAR

## 2017-09-04 MED ORDER — ROPINIROLE HCL 1 MG PO TABS: 1.0000 mg | ORAL_TABLET | Freq: Every day | ORAL | 3 refills | Status: DC

## 2017-09-04 NOTE — Progress Notes (Signed)
   HPI  Patient presents today here for illness, also for follow-up chronic medical conditions.  Restless leg syndrome Ropinirole is working well.  Dyslipidemia Nonfasting today.  She would like labs.  Illness Patient reports 5 days of cough, congestion, intermittent bilateral maxillary and frontal sinus pain and pressure. She is tolerating food and fluids like usual. She does have a cough also, nasal mucus is turning yellow/green  PMH: Smoking status noted ROS: Per HPI  Objective: BP 116/74 (BP Location: Right Arm)   Pulse 75   Temp 97.8 F (36.6 C) (Oral)   Ht 5\' 8"  (1.727 m)   Wt 199 lb (90.3 kg)   BMI 30.26 kg/m  Gen: NAD, alert, cooperative with exam HEENT: NCAT, tender to palpation of right frontal sinus CV: RRR, good S1/S2, no murmur Resp: CTABL, no wheezes, non-labored Ext: No edema, warm Neuro: Alert and oriented, No gross deficits  Assessment and plan:  #Acute frontal sinusitis Treat with Augmentin IM Depo-Medrol also Patient is going to [place] next week  #Dyslipidemia Nonfasting labs today  #Restless leg syndrome Doing well with ropinirole, refilled   Meds ordered this encounter  Medications  . methylPREDNISolone acetate (DEPO-MEDROL) injection 80 mg  . cefdinir (OMNICEF) 300 MG capsule    Sig: Take 1 capsule (300 mg total) by mouth 2 (two) times daily. 1 po BID    Dispense:  20 capsule    Refill:  0  . rOPINIRole (REQUIP) 1 MG tablet    Sig: Take 1 tablet (1 mg total) by mouth at bedtime.    Dispense:  90 tablet    Refill:  [place], MD [facility] 09/04/2017, 9:12 AM

## 2017-09-04 NOTE — Patient Instructions (Signed)
Great to see you!   Sinusitis, Adult Sinusitis is soreness and inflammation of your sinuses. Sinuses are hollow spaces in the bones around your face. They are located:  Around your eyes.  In the middle of your forehead.  Behind your nose.  In your cheekbones.  Your sinuses and nasal passages are lined with a stringy fluid (mucus). Mucus normally drains out of your sinuses. When your nasal tissues get inflamed or swollen, the mucus can get trapped or blocked so air cannot flow through your sinuses. This lets bacteria, viruses, and funguses grow, and that leads to infection. Follow these instructions at home: Medicines  Take, use, or apply over-the-counter and prescription medicines only as told by your doctor. These may include nasal sprays.  If you were prescribed an antibiotic medicine, take it as told by your doctor. Do not stop taking the antibiotic even if you start to feel better. Hydrate and Humidify  Drink enough water to keep your pee (urine) clear or pale yellow.  Use a cool mist humidifier to keep the humidity level in your home above 50%.  Breathe in steam for 10-15 minutes, 3-4 times a day or as told by your doctor. You can do this in the bathroom while a hot shower is running.  Try not to spend time in cool or dry air. Rest  Rest as much as possible.  Sleep with your head raised (elevated).  Make sure to get enough sleep each night. General instructions  Put a warm, moist washcloth on your face 3-4 times a day or as told by your doctor. This will help with discomfort.  Wash your hands often with soap and water. If there is no soap and water, use hand sanitizer.  Do not smoke. Avoid being around people who are smoking (secondhand smoke).  Keep all follow-up visits as told by your doctor. This is important. Contact a doctor if:  You have a fever.  Your symptoms get worse.  Your symptoms do not get better within 10 days. Get help right away if:  You  have a very bad headache.  You cannot stop throwing up (vomiting).  You have pain or swelling around your face or eyes.  You have trouble seeing.  You feel confused.  Your neck is stiff.  You have trouble breathing. This information is not intended to replace advice given to you by your health care provider. Make sure you discuss any questions you have with your health care provider. Document Released: 10/29/2007 Document Revised: 01/06/2016 Document Reviewed: 03/07/2015 Elsevier Interactive Patient Education  2018 [employer] Inc[employer]  

## 2017-09-05 LAB — CBC WITH DIFFERENTIAL/PLATELET
BASOS: 0 %
Basophils Absolute: 0 10*3/uL (ref 0.0–0.2)
EOS (ABSOLUTE): 0.2 10*3/uL (ref 0.0–0.4)
EOS: 4 %
HEMATOCRIT: 44.1 % (ref 34.0–46.6)
HEMOGLOBIN: 15.1 g/dL (ref 11.1–15.9)
Immature Grans (Abs): 0 10*3/uL (ref 0.0–0.1)
Immature Granulocytes: 0 %
LYMPHS ABS: 2.1 10*3/uL (ref 0.7–3.1)
Lymphs: 34 %
MCH: 27.6 pg (ref 26.6–33.0)
MCHC: 34.2 g/dL (ref 31.5–35.7)
MCV: 81 fL (ref 79–97)
MONOCYTES: 9 %
MONOS ABS: 0.5 10*3/uL (ref 0.1–0.9)
NEUTROS ABS: 3.2 10*3/uL (ref 1.4–7.0)
Neutrophils: 53 %
Platelets: 203 10*3/uL (ref 150–379)
RBC: 5.47 x10E6/uL — ABNORMAL HIGH (ref 3.77–5.28)
RDW: 14 % (ref 12.3–15.4)
WBC: 6.1 10*3/uL (ref 3.4–10.8)

## 2017-09-05 LAB — CMP14+EGFR
ALBUMIN: 4.2 g/dL (ref 3.5–5.5)
ALT: 19 IU/L (ref 0–32)
AST: 16 IU/L (ref 0–40)
Albumin/Globulin Ratio: 2.1 (ref 1.2–2.2)
Alkaline Phosphatase: 82 IU/L (ref 39–117)
BUN / CREAT RATIO: 19 (ref 9–23)
BUN: 12 mg/dL (ref 6–24)
Bilirubin Total: 0.8 mg/dL (ref 0.0–1.2)
CO2: 26 mmol/L (ref 20–29)
CREATININE: 0.63 mg/dL (ref 0.57–1.00)
Calcium: 9.5 mg/dL (ref 8.7–10.2)
Chloride: 104 mmol/L (ref 96–106)
GFR calc non Af Amer: 103 mL/min/{1.73_m2} (ref >59)
GFR, EST AFRICAN AMERICAN: 119 mL/min/{1.73_m2} (ref >59)
GLOBULIN, TOTAL: 2 g/dL (ref 1.5–4.5)
GLUCOSE: 79 mg/dL (ref 65–99)
Potassium: 4.5 mmol/L (ref 3.5–5.2)
SODIUM: 143 mmol/L (ref 134–144)
TOTAL PROTEIN: 6.2 g/dL (ref 6.0–8.5)

## 2017-09-05 LAB — LIPID PANEL
Chol/HDL Ratio: 2.8 ratio (ref 0.0–4.4)
Cholesterol, Total: 196 mg/dL (ref 100–199)
HDL: 70 mg/dL (ref >39)
LDL Calculated: 108 mg/dL — ABNORMAL HIGH (ref 0–99)
Triglycerides: 89 mg/dL (ref 0–149)
VLDL CHOLESTEROL CAL: 18 mg/dL (ref 5–40)

## 2017-09-05 LAB — FERRITIN: Ferritin: 56 ng/mL (ref 15–150)

## 2017-09-07 ENCOUNTER — Other Ambulatory Visit: Payer: Self-pay | Admitting: Physician Assistant

## 2017-09-07 ENCOUNTER — Telehealth: Payer: Self-pay | Admitting: Family Medicine

## 2017-09-07 MED ORDER — DOXYCYCLINE HYCLATE 100 MG PO TABS: 100.0000 mg | ORAL_TABLET | Freq: Two times a day (BID) | ORAL | 0 refills | Status: DC

## 2017-09-07 MED ORDER — TRIAMCINOLONE ACETONIDE 55 MCG/ACT NA AERO: 2.0000 | INHALATION_SPRAY | Freq: Every day | NASAL | 12 refills | Status: DC

## 2017-09-07 NOTE — Telephone Encounter (Signed)
Pt was seen Friday and dx with sinus infection with Wendi Snipes. She is no better congestion is worse with sinus pressure. She leaves out of country Wednesday. Wendi Snipes told her to call if she was no better declined appt. Please advise. [facility] in [place].

## 2017-09-07 NOTE — Telephone Encounter (Signed)
Sent doxycycline and nasacort

## 2017-09-07 NOTE — Telephone Encounter (Signed)
Patient aware.

## 2018-07-14 ENCOUNTER — Other Ambulatory Visit: Payer: Self-pay | Admitting: *Deleted

## 2018-07-14 MED ORDER — ROPINIROLE HCL 1 MG PO TABS: 1.0000 mg | ORAL_TABLET | Freq: Every day | ORAL | 0 refills | Status: DC

## 2018-08-03 NOTE — Progress Notes (Deleted)
Office Visit Note  Patient: Candace Rodriguez             Date of Birth: Mar 27, 1966           MRN: [id number]             PCP: Timmothy Euler, MD (Inactive) Referring: Timmothy Euler, MD Visit Date: 08/17/2018 Occupation: @GUAROCC @  Subjective:  No chief complaint on file.   History of Present Illness: Candace Rodriguez is a 53 y.o. female ***   Activities of Daily Living:  Patient reports morning stiffness for *** {minute/hour:19697}.   Patient {ACTIONS;DENIES/REPORTS:21021675::"Denies"} nocturnal pain.  Difficulty dressing/grooming: {ACTIONS;DENIES/REPORTS:21021675::"Denies"} Difficulty climbing stairs: {ACTIONS;DENIES/REPORTS:21021675::"Denies"} Difficulty getting out of chair: {ACTIONS;DENIES/REPORTS:21021675::"Denies"} Difficulty using hands for taps, buttons, cutlery, and/or writing: {ACTIONS;DENIES/REPORTS:21021675::"Denies"}  No Rheumatology ROS completed.   PMFS History:  Patient Active Problem List   Diagnosis Date Noted  . History of restless legs syndrome 07/24/2016  . History of migraine 07/15/2016  . History of hypothyroidism 07/15/2016  . History of partial knee replacement 07/15/2016  . Hyperbilirubinemia 11/05/2015  . Chronic venous insufficiency 10/15/2015  . Healthcare maintenance 10/15/2015  . OSA (obstructive sleep apnea) 10/25/2014  . PCOS (polycystic ovarian syndrome) 06/12/2014  . Peripheral edema 06/12/2014  . Obesity, morbid ([place]) 09/13/2012  . Dyslipidemia 09/13/2012  . Essential hypertension, benign 09/13/2012  . Degenerative joint disease 09/13/2012  . Hypothyroidism 09/13/2012  . Restless leg syndrome, controlled 09/13/2012  . Migraine headache 09/13/2012  . Gout 09/13/2012    Past Medical History:  Diagnosis Date  . Asthma   . Depression   . DJD (degenerative joint disease)   . Fractures    Left distal radius,  left patella  . Hyperlipidemia   . Hypertension   . Migraine   . Obesity   . OSA (obstructive sleep  apnea)    Dx by [facility]  . Restless leg syndrome   . Sleep apnea   . Thyroid disease   . Tinnitus     Family History  Problem Relation Age of Onset  . Arthritis Mother   . Cancer Maternal Grandmother   . Heart disease Maternal Grandmother   . Cancer Maternal Grandfather   . Cancer Paternal Grandmother   . Heart disease Paternal Grandfather   . Arthritis Father   . Hypertension Father   . Atrial fibrillation Brother    Past Surgical History:  Procedure Laterality Date  . CHOLECYSTECTOMY  10/2013  . JOINT REPLACEMENT Bilateral 10/2008    knee each partial replacement  . KNEE SURGERY    . LAPAROSCOPIC ABDOMINAL EXPLORATION  06/1993  . PARTIAL GASTRECTOMY  11/16/2012   [facility]   . WRIST SURGERY  09/1998   L   Social History   Social History Narrative  . Not on file   Immunization History  Administered Date(s) Administered  . Influenza,inj,Quad PF,6+ Mos 02/16/2017  . Pneumococcal Polysaccharide-23 03/17/2006, 11/15/2013  . Td 12/23/2007     Objective: Vital Signs: There were no vitals taken for this visit.   Physical Exam   Musculoskeletal Exam: ***  CDAI Exam: CDAI Score: Not documented Patient Global Assessment: Not documented; Provider Global Assessment: Not documented Swollen: Not documented; Tender: Not documented Joint Exam   Not documented   There is currently no information documented on the homunculus. Go to the Rheumatology activity and complete the homunculus joint exam.  Investigation: No additional findings.  Imaging: No results found.  Recent Labs: Lab Results  Component Value Date  WBC 6.1 09/04/2017   HGB 15.1 09/04/2017   PLT 203 09/04/2017   NA 143 09/04/2017   K 4.5 09/04/2017   CL 104 09/04/2017   CO2 26 09/04/2017   GLUCOSE 79 09/04/2017   BUN 12 09/04/2017   CREATININE 0.63 09/04/2017   BILITOT 0.8 09/04/2017   ALKPHOS 82 09/04/2017   AST 16 09/04/2017   ALT 19 09/04/2017   PROT 6.2 09/04/2017    ALBUMIN 4.2 09/04/2017   CALCIUM 9.5 09/04/2017   GFRAA 119 09/04/2017    Speciality Comments: No specialty comments available.  Procedures:  No procedures performed Allergies: Avelox [moxifloxacin]; Gluten meal; Other; Penicillin g; Penicillins; Quinolones; and Soy allergy   Assessment / Plan:     Visit Diagnoses: No diagnosis found.   Orders: No orders of the defined types were placed in this encounter.  No orders of the defined types were placed in this encounter.   Face-to-face time spent with patient was *** minutes. Greater than 50% of time was spent in counseling and coordination of care.  Follow-Up Instructions: No follow-ups on file.   Earnestine Mealing, CMA  Note - This record has been created using Editor, commissioning.  Chart creation errors have been sought, but may not always  have been located. Such creation errors do not reflect on  the standard of medical care.

## 2018-08-16 DIAGNOSIS — M19041 Primary osteoarthritis, right hand: Secondary | ICD-10-CM | POA: Insufficient documentation

## 2018-08-16 DIAGNOSIS — M19042 Primary osteoarthritis, left hand: Secondary | ICD-10-CM

## 2018-08-16 DIAGNOSIS — M51369 Other intervertebral disc degeneration, lumbar region without mention of lumbar back pain or lower extremity pain: Secondary | ICD-10-CM | POA: Insufficient documentation

## 2018-08-16 DIAGNOSIS — M5136 Other intervertebral disc degeneration, lumbar region: Secondary | ICD-10-CM | POA: Insufficient documentation

## 2018-08-16 NOTE — Progress Notes (Signed)
Office Visit Note  Patient: Candace Rodriguez             Date of Birth: May 21, 1966           MRN: [id number]             PCP: Timmothy Euler, MD (Inactive) Referring: Timmothy Euler, MD Visit Date: 08/17/2018 Occupation: @GUAROCC @  Subjective:  Right shoulder pain.   History of Present Illness: Candace Rodriguez is a 53 y.o. female with history of chronic gout.  She states she has not had a gout flare in a long time.  She is not taking any gout medications currently.  She states for the last few months she has been having increased pain and discomfort in her right shoulder joint.  She has difficulty tucking her shirt and and also lifting objects.  She has difficulty raising her right arm.  She has some discomfort in her left shoulder.  She is very active at her home.  She has minimal discomfort with her bilateral knees which are partially replaced.  She feels a lot of stiffness in the morning in her hands.  States she was in the weight management program where she was doing a lot of walking and after that her feet started hurting.  Activities of Daily Living:  Patient reports morning stiffness for 5 minutes.   Patient Denies nocturnal pain.  Difficulty dressing/grooming: Denies Difficulty climbing stairs: Denies Difficulty getting out of chair: Denies Difficulty using hands for taps, buttons, cutlery, and/or writing: Denies  Review of Systems  Constitutional: Positive for fatigue. Negative for night sweats, weight gain and weight loss.  HENT: Negative for mouth sores, trouble swallowing, trouble swallowing, mouth dryness and nose dryness.   Eyes: Negative for pain, redness, visual disturbance and dryness.  Respiratory: Negative for cough, shortness of breath and difficulty breathing.   Cardiovascular: Negative for chest pain, palpitations, hypertension, irregular heartbeat and swelling in legs/feet.  Gastrointestinal: Negative for blood in stool, constipation and  diarrhea.  Endocrine: Negative for excessive thirst and increased urination.  Genitourinary: Negative for difficulty urinating and vaginal dryness.  Musculoskeletal: Positive for arthralgias and joint pain. Negative for joint swelling, myalgias, muscle weakness, morning stiffness, muscle tenderness and myalgias.  Skin: Negative for color change, rash, hair loss, skin tightness, ulcers and sensitivity to sunlight.  Allergic/Immunologic: Negative for susceptible to infections.  Neurological: Negative for dizziness, numbness, memory loss, night sweats and weakness.  Hematological: Negative for bruising/bleeding tendency and swollen glands.  Psychiatric/Behavioral: Positive for sleep disturbance. Negative for depressed mood. The patient is not nervous/anxious.     PMFS History:  Patient Active Problem List   Diagnosis Date Noted  . Primary osteoarthritis of both hands 08/16/2018  . DDD (degenerative disc disease), lumbar 08/16/2018  . History of restless legs syndrome 07/24/2016  . History of migraine 07/15/2016  . History of hypothyroidism 07/15/2016  . History of partial knee replacement 07/15/2016  . Hyperbilirubinemia 11/05/2015  . Chronic venous insufficiency 10/15/2015  . Healthcare maintenance 10/15/2015  . OSA (obstructive sleep apnea) 10/25/2014  . PCOS (polycystic ovarian syndrome) 06/12/2014  . Peripheral edema 06/12/2014  . Obesity, morbid ([place]) 09/13/2012  . Dyslipidemia 09/13/2012  . Essential hypertension, benign 09/13/2012  . Hypothyroidism 09/13/2012  . Restless leg syndrome, controlled 09/13/2012  . Migraine headache 09/13/2012  . Gout 09/13/2012    Past Medical History:  Diagnosis Date  . Asthma   . Depression   . DJD (degenerative joint disease)   . Fractures  Left distal radius,  left patella  . Hyperlipidemia   . Hypertension   . Lyme disease   . Migraine   . Obesity   . OSA (obstructive sleep apnea)    Dx by [facility]  . Restless  leg syndrome   . Sleep apnea   . Thyroid disease   . Tinnitus     Family History  Problem Relation Age of Onset  . Arthritis Mother   . Cancer Maternal Grandmother   . Heart disease Maternal Grandmother   . Cancer Maternal Grandfather   . Cancer Paternal Grandmother   . Heart disease Paternal Grandfather   . Arthritis Father   . Hypertension Father   . Atrial fibrillation Brother    Past Surgical History:  Procedure Laterality Date  . CHOLECYSTECTOMY  10/2013  . JOINT REPLACEMENT Bilateral 10/2008    knee each partial replacement  . KNEE SURGERY    . LAPAROSCOPIC ABDOMINAL EXPLORATION  06/1993  . PARTIAL GASTRECTOMY  11/16/2012   VSG   . WRIST SURGERY  09/1998   L   Social History   Social History Narrative  . Not on file   Immunization History  Administered Date(s) Administered  . Influenza,inj,Quad PF,6+ Mos 02/16/2017  . Pneumococcal Polysaccharide-23 03/17/2006, 11/15/2013  . Td 12/23/2007     Objective: Vital Signs: BP (!) 157/81 (BP Location: Left Arm, Patient Position: Sitting, Cuff Size: Normal)   Pulse 83   Resp 16   Ht 5\' 8"  (1.727 m)   Wt 234 lb (106.1 kg)   BMI 35.58 kg/m    Physical Exam Vitals signs and nursing note reviewed.  Constitutional:      Appearance: She is well-developed.  HENT:     Head: Normocephalic and atraumatic.  Eyes:     Conjunctiva/sclera: Conjunctivae normal.  Neck:     Musculoskeletal: Normal range of motion.  Cardiovascular:     Rate and Rhythm: Normal rate and regular rhythm.     Heart sounds: Normal heart sounds.  Pulmonary:     Effort: Pulmonary effort is normal.     Breath sounds: Normal breath sounds.  Abdominal:     General: Bowel sounds are normal.     Palpations: Abdomen is soft.  Lymphadenopathy:     Cervical: No cervical adenopathy.  Skin:    General: Skin is warm and dry.     Capillary Refill: Capillary refill takes less than 2 seconds.  Neurological:     Mental Status: She is alert and oriented to  person, place, and time.  Psychiatric:        Behavior: Behavior normal.      Musculoskeletal Exam: C-spine good range of motion.  She is some discomfort range of motion of her lumbar spine.  She had discomfort range of motion of her right shoulder joint.  She had painful abduction.  No discomfort with external rotation.  Left shoulder joint was in good range of motion.  Elbow joints with good range of motion.  She has DIP and PIP thickening in her hands consistent with osteoarthritis.  She had bilateral partial knee replacement which appears to be doing well.  No warmth swelling or effusion was noted in her joints.  CDAI Exam: CDAI Score: Not documented Patient Global Assessment: Not documented; Provider Global Assessment: Not documented Swollen: Not documented; Tender: Not documented Joint Exam   Not documented   There is currently no information documented on the homunculus. Go to the Rheumatology activity and complete the  homunculus joint exam.  Investigation: No additional findings.  Imaging: No results found.  Recent Labs: Lab Results  Component Value Date   WBC 6.1 09/04/2017   HGB 15.1 09/04/2017   PLT 203 09/04/2017   NA 143 09/04/2017   K 4.5 09/04/2017   CL 104 09/04/2017   CO2 26 09/04/2017   GLUCOSE 79 09/04/2017   BUN 12 09/04/2017   CREATININE 0.63 09/04/2017   BILITOT 0.8 09/04/2017   ALKPHOS 82 09/04/2017   AST 16 09/04/2017   ALT 19 09/04/2017   PROT 6.2 09/04/2017   ALBUMIN 4.2 09/04/2017   CALCIUM 9.5 09/04/2017   GFRAA 119 09/04/2017    Speciality Comments: No specialty comments available.  Procedures:  Large Joint Inj: R glenohumeral on 08/17/2018 11:51 AM Indications: pain Details: 27 G 1.5 in needle, posterior approach  Arthrogram: No  Medications: 1 mL lidocaine 1 %; 40 mg triamcinolone acetonide 40 MG/ML Aspirate: 0 mL Outcome: tolerated well, no immediate complications Procedure, treatment alternatives, risks and benefits explained,  specific risks discussed. Consent was given by the patient. Immediately prior to procedure a time out was called to verify the correct patient, procedure, equipment, support staff and site/side marked as required. Patient was prepped and draped in the usual sterile fashion.     Allergies: Avelox [moxifloxacin]; Gluten meal; Other; Penicillin g; Penicillins; Quinolones; and Soy allergy   Assessment / Plan:     Visit Diagnoses: Pain in joint of right shoulder -patient has been experiencing pain and discomfort in her right shoulder for the last few months.  She states the pain started after reviewing classes and has been getting worse.  Plan: XR Shoulder Right.  The x-ray of the shoulder was unremarkable.  Per patient's request right shoulder joint was injected with cortisone which she tolerated well.  Idiopathic chronic gout of multiple sites without tophus -patient denies any gout flare.  Plan: Uric acid  Primary osteoarthritis of both hands-she has severe DIP and PIP thickening and has morning stiffness but no synovitis on examination.  History of bilateral partial knee replacement-doing well.  DDD (degenerative disc disease), lumbar-she has fairly good range of motion of her lumbar spine.  Essential hypertension, benign-her blood pressure is elevated today.  Have advised her to monitor blood pressure closely.  Dyslipidemia  History of hypothyroidism  History of migraine  History of restless legs syndrome  Hypothyroidism, unspecified type  OSA (obstructive sleep apnea)   Orders: Orders Placed This Encounter  Procedures  . XR Shoulder Right  . Uric acid   No orders of the defined types were placed in this encounter.   Face-to-face time spent with patient was 30 minutes. Greater than 50% of time was spent in counseling and coordination of care.  Follow-Up Instructions: Return in about 1 year (around 08/17/2019) for Osteoarthritis, gout.   Bo Merino, MD  Note - This  record has been created using Editor, commissioning.  Chart creation errors have been sought, but may not always  have been located. Such creation errors do not reflect on  the standard of medical care.

## 2018-08-17 ENCOUNTER — Encounter: Payer: Self-pay | Admitting: Rheumatology

## 2018-08-17 ENCOUNTER — Ambulatory Visit: Payer: BC Managed Care – PPO | Admitting: Rheumatology

## 2018-08-17 ENCOUNTER — Ambulatory Visit (INDEPENDENT_AMBULATORY_CARE_PROVIDER_SITE_OTHER): Payer: BC Managed Care – PPO

## 2018-08-17 ENCOUNTER — Other Ambulatory Visit: Payer: Self-pay

## 2018-08-17 VITALS — BP 157/81 | HR 83 | Resp 16 | Ht 68.0 in | Wt 234.0 lb

## 2018-08-17 DIAGNOSIS — M1A09X0 Idiopathic chronic gout, multiple sites, without tophus (tophi): Secondary | ICD-10-CM

## 2018-08-17 DIAGNOSIS — Z8669 Personal history of other diseases of the nervous system and sense organs: Secondary | ICD-10-CM

## 2018-08-17 DIAGNOSIS — M25511 Pain in right shoulder: Secondary | ICD-10-CM

## 2018-08-17 DIAGNOSIS — Z96659 Presence of unspecified artificial knee joint: Secondary | ICD-10-CM | POA: Diagnosis not present

## 2018-08-17 DIAGNOSIS — Z8639 Personal history of other endocrine, nutritional and metabolic disease: Secondary | ICD-10-CM

## 2018-08-17 DIAGNOSIS — E785 Hyperlipidemia, unspecified: Secondary | ICD-10-CM

## 2018-08-17 DIAGNOSIS — M19041 Primary osteoarthritis, right hand: Secondary | ICD-10-CM

## 2018-08-17 DIAGNOSIS — M19042 Primary osteoarthritis, left hand: Secondary | ICD-10-CM

## 2018-08-17 DIAGNOSIS — M5136 Other intervertebral disc degeneration, lumbar region: Secondary | ICD-10-CM

## 2018-08-17 DIAGNOSIS — M51369 Other intervertebral disc degeneration, lumbar region without mention of lumbar back pain or lower extremity pain: Secondary | ICD-10-CM

## 2018-08-17 DIAGNOSIS — I10 Essential (primary) hypertension: Secondary | ICD-10-CM

## 2018-08-17 DIAGNOSIS — G4733 Obstructive sleep apnea (adult) (pediatric): Secondary | ICD-10-CM

## 2018-08-17 DIAGNOSIS — E039 Hypothyroidism, unspecified: Secondary | ICD-10-CM

## 2018-08-17 MED ORDER — TRIAMCINOLONE ACETONIDE 40 MG/ML IJ SUSP
40.0000 mg | INTRAMUSCULAR | Status: AC | PRN
  Administered 2018-08-17: 40 mg via INTRA_ARTICULAR

## 2018-08-17 MED ORDER — LIDOCAINE HCL 1 % IJ SOLN
1.0000 mL | INTRAMUSCULAR | Status: AC | PRN
  Administered 2018-08-17: 1 mL

## 2018-08-17 NOTE — Patient Instructions (Signed)
Shoulder Exercises Ask your health care provider which exercises are safe for you. Do exercises exactly as told by your health care provider and adjust them as directed. It is normal to feel mild stretching, pulling, tightness, or discomfort as you do these exercises, but you should stop right away if you feel sudden pain or your pain gets worse.Do not begin these exercises until told by your health care provider. Range of Motion Exercises        These exercises warm up your muscles and joints and improve the movement and flexibility of your shoulder. These exercises also help to relieve pain, numbness, and tingling. These exercises involve stretching your injured shoulder directly. Exercise A: Pendulum 1. Stand near a wall or a surface that you can hold onto for balance. 2. Bend at the waist and let your left / right arm hang straight down. Use your other arm to support you. Keep your back straight and do not lock your knees. 3. Relax your left / right arm and shoulder muscles, and move your hips and your trunk so your left / right arm swings freely. Your arm should swing because of the motion of your body, not because you are using your arm or shoulder muscles. 4. Keep moving your body so your arm swings in the following directions, as told by your health care provider: ? Side to side. ? Forward and backward. ? In clockwise and counterclockwise circles. 5. Continue each motion for __________ seconds, or for as long as told by your health care provider. 6. Slowly return to the starting position. Repeat __________ times. Complete this exercise __________ times a day. Exercise B:Flexion, Standing 1. Stand and hold a broomstick, a cane, or a similar object. Place your hands a little more than shoulder-width apart on the object. Your left / right hand should be palm-up, and your other hand should be palm-down. 2. Keep your elbow straight and keep your shoulder muscles relaxed. Push the stick  down with your healthy arm to raise your left / right arm in front of your body, and then over your head until you feel a stretch in your shoulder. ? Avoid shrugging your shoulder while you raise your arm. Keep your shoulder blade tucked down toward the middle of your back. 3. Hold for __________ seconds. 4. Slowly return to the starting position. Repeat __________ times. Complete this exercise __________ times a day. Exercise C: Abduction, Standing 1. Stand and hold a broomstick, a cane, or a similar object. Place your hands a little more than shoulder-width apart on the object. Your left / right hand should be palm-up, and your other hand should be palm-down. 2. While keeping your elbow straight and your shoulder muscles relaxed, push the stick across your body toward your left / right side. Raise your left / right arm to the side of your body and then over your head until you feel a stretch in your shoulder. ? Do not raise your arm above shoulder height, unless your health care provider tells you to do that. ? Avoid shrugging your shoulder while you raise your arm. Keep your shoulder blade tucked down toward the middle of your back. 3. Hold for __________ seconds. 4. Slowly return to the starting position. Repeat __________ times. Complete this exercise __________ times a day. Exercise D:Internal Rotation 1. Place your left / right hand behind your back, palm-up. 2. Use your other hand to dangle an exercise band, a towel, or a similar object over your shoulder.   Grasp the band with your left / right hand so you are holding onto both ends. 3. Gently pull up on the band until you feel a stretch in the front of your left / right shoulder. ? Avoid shrugging your shoulder while you raise your arm. Keep your shoulder blade tucked down toward the middle of your back. 4. Hold for __________ seconds. 5. Release the stretch by letting go of the band and lowering your hands. Repeat __________ times.  Complete this exercise __________ times a day. Stretching Exercises  These exercises warm up your muscles and joints and improve the movement and flexibility of your shoulder. These exercises also help to relieve pain, numbness, and tingling. These exercises are done using your healthy shoulder to help stretch the muscles of your injured shoulder. Exercise E: Corner Stretch (External Rotation and Abduction) 1. Stand in a doorway with one of your feet slightly in front of the other. This is called a staggered stance. If you cannot reach your forearms to the door frame, stand facing a corner of a room. 2. Choose one of the following positions as told by your health care provider: ? Place your hands and forearms on the door frame above your head. ? Place your hands and forearms on the door frame at the height of your head. ? Place your hands on the door frame at the height of your elbows. 3. Slowly move your weight onto your front foot until you feel a stretch across your chest and in the front of your shoulders. Keep your head and chest upright and keep your abdominal muscles tight. 4. Hold for __________ seconds. 5. To release the stretch, shift your weight to your back foot. Repeat __________ times. Complete this stretch __________ times a day. Exercise F:Extension, Standing 1. Stand and hold a broomstick, a cane, or a similar object behind your back. ? Your hands should be a little wider than shoulder-width apart. ? Your palms should face away from your back. 2. Keeping your elbows straight and keeping your shoulder muscles relaxed, move the stick away from your body until you feel a stretch in your shoulder. ? Avoid shrugging your shoulders while you move the stick. Keep your shoulder blade tucked down toward the middle of your back. 3. Hold for __________ seconds. 4. Slowly return to the starting position. Repeat __________ times. Complete this exercise __________ times a  day. Strengthening Exercises           These exercises build strength and endurance in your shoulder. Endurance is the ability to use your muscles for a long time, even after they get tired. Exercise G:External Rotation 1. Sit in a stable chair without armrests. 2. Secure an exercise band at elbow height on your left / right side. 3. Place a soft object, such as a folded towel or a small pillow, between your left / right upper arm and your body to move your elbow a few inches away (about 10 cm) from your side. 4. Hold the end of the band so it is tight and there is no slack. 5. Keeping your elbow pressed against the soft object, move your left / right forearm out, away from your abdomen. Keep your body steady so only your forearm moves. 6. Hold for __________ seconds. 7. Slowly return to the starting position. Repeat __________ times. Complete this exercise __________ times a day. Exercise H:Shoulder Abduction 1. Sit in a stable chair without armrests, or stand. 2. Hold a __________ weight in your   left / right hand, or hold an exercise band with both hands. 3. Start with your arms straight down and your left / right palm facing in, toward your body. 4. Slowly lift your left / right hand out to your side. Do not lift your hand above shoulder height unless your health care provider tells you that this is safe. ? Keep your arms straight. ? Avoid shrugging your shoulder while you do this movement. Keep your shoulder blade tucked down toward the middle of your back. 5. Hold for __________ seconds. 6. Slowly lower your arm, and return to the starting position. Repeat __________ times. Complete this exercise __________ times a day. Exercise I:Shoulder Extension 1. Sit in a stable chair without armrests, or stand. 2. Secure an exercise band to a stable object in front of you where it is at shoulder height. 3. Hold one end of the exercise band in each hand. Your palms should face each  other. 4. Straighten your elbows and lift your hands up to shoulder height. 5. Step back, away from the secured end of the exercise band, until the band is tight and there is no slack. 6. Squeeze your shoulder blades together as you pull your hands down to the sides of your thighs. Stop when your hands are straight down by your sides. Do not let your hands go behind your body. 7. Hold for __________ seconds. 8. Slowly return to the starting position. Repeat __________ times. Complete this exercise __________ times a day. Exercise J:Standing Shoulder Row 1. Sit in a stable chair without armrests, or stand. 2. Secure an exercise band to a stable object in front of you so it is at waist height. 3. Hold one end of the exercise band in each hand. Your palms should be in a thumbs-up position. 4. Bend each of your elbows to an "L" shape (about 90 degrees) and keep your upper arms at your sides. 5. Step back until the band is tight and there is no slack. 6. Slowly pull your elbows back behind you. 7. Hold for __________ seconds. 8. Slowly return to the starting position. Repeat __________ times. Complete this exercise __________ times a day. Exercise K:Shoulder Press-Ups 1. Sit in a stable chair that has armrests. Sit upright, with your feet flat on the floor. 2. Put your hands on the armrests so your elbows are bent and your fingers are pointing forward. Your hands should be about even with the sides of your body. 3. Push down on the armrests and use your arms to lift yourself off of the chair. Straighten your elbows and lift yourself up as much as you comfortably can. ? Move your shoulder blades down, and avoid letting your shoulders move up toward your ears. ? Keep your feet on the ground. As you get stronger, your feet should support less of your body weight as you lift yourself up. 4. Hold for __________ seconds. 5. Slowly lower yourself back into the chair. Repeat __________ times. Complete  this exercise __________ times a day. Exercise L: Wall Push-Ups 1. Stand so you are facing a stable wall. Your feet should be about one arm-length away from the wall. 2. Lean forward and place your palms on the wall at shoulder height. 3. Keep your feet flat on the floor as you bend your elbows and lean forward toward the wall. 4. Hold for __________ seconds. 5. Straighten your elbows to push yourself back to the starting position. Repeat __________ times. Complete this exercise __________ times   a day. This information is not intended to replace advice given to you by your health care provider. Make sure you discuss any questions you have with your health care provider. Document Released: 03/26/2005 Document Revised: 09/15/2017 Document Reviewed: 01/21/2015 [employer] Interactive Patient Education  2019 [employer]  

## 2018-08-18 LAB — URIC ACID: Uric Acid, Serum: 4.5 mg/dL (ref 2.5–7.0)

## 2018-10-06 ENCOUNTER — Other Ambulatory Visit: Payer: Self-pay

## 2018-10-06 ENCOUNTER — Ambulatory Visit (INDEPENDENT_AMBULATORY_CARE_PROVIDER_SITE_OTHER): Payer: BC Managed Care – PPO | Admitting: Family Medicine

## 2018-10-06 DIAGNOSIS — J4520 Mild intermittent asthma, uncomplicated: Secondary | ICD-10-CM | POA: Diagnosis not present

## 2018-10-06 DIAGNOSIS — E039 Hypothyroidism, unspecified: Secondary | ICD-10-CM

## 2018-10-06 DIAGNOSIS — G2581 Restless legs syndrome: Secondary | ICD-10-CM

## 2018-10-06 MED ORDER — ROPINIROLE HCL 1 MG PO TABS: 1.0000 mg | ORAL_TABLET | Freq: Every day | ORAL | 1 refills | Status: DC

## 2018-10-06 MED ORDER — ALBUTEROL SULFATE HFA 108 (90 BASE) MCG/ACT IN AERS: 2.0000 | INHALATION_SPRAY | Freq: Four times a day (QID) | RESPIRATORY_TRACT | 4 refills | Status: DC | PRN

## 2018-10-06 NOTE — Progress Notes (Signed)
Telephone visit  Subjective: XB:MWUXLKGM leg syndrome PCP: Janora Norlander, DO [id number] Candace Rodriguez is a 53 y.o. female calls for telephone consult today. Patient provides verbal consent for consult held via phone.  Location of patient: home Location of provider: [facility] Others present for call: none  1. Restless leg syndrome She reports longstanding history of restless leg syndrome.  She notes that she is also seen by [facility] for chronic Lyme disease and that changes in her medication have improved the restless leg.  She states that prior to disulfiram she would have restlessness in her lower extremities in the mid afternoon.  This seems to be improving.  She does report controlled during the nighttime with Requip 1 mg.  2.  Hypothyroidism Again, patient followed by [facility] for her hypothyroidism.  No history of thyroidectomy, surgery to the neck or radiation to the neck.  She is compliant with her thyroid replacement.  She had her labs obtained from them recently and will try and get copies of those to me.  3.  Asthma Patient reports rare use of albuterol.  She has an albuterol inhaler on hand but this is expired x1 year.  She reports breathing is normal.  No wheezes, shortness of breath at this time.   ROS: Per HPI  Allergies  Allergen Reactions  . Avelox [Moxifloxacin]   . Gluten Meal Other (See Comments)    Joint Pain  . Other Other (See Comments)    Dairy intolerance  . Penicillin G Itching and Other (See Comments)    Turns red in color.  . Penicillins   . Quinolones Other (See Comments)    Pt can't remeber  . Soy Allergy Rash    Rash in mouth.   Past Medical History:  Diagnosis Date  . Asthma   . Depression   . DJD (degenerative joint disease)   . Fractures    Left distal radius,  left patella  . Hyperlipidemia   . Hypertension   . Lyme disease   . Migraine   . Obesity   . OSA (obstructive sleep apnea)    Dx by  [facility]  . Restless leg syndrome   . Sleep apnea   . Thyroid disease   . Tinnitus     Current Outpatient Medications:  .  albuterol (PROVENTIL HFA;VENTOLIN HFA) 108 (90 BASE) MCG/ACT inhaler, Inhale 2 puffs into the lungs every 6 (six) hours as needed for wheezing., Disp: 1 Inhaler, Rfl: 4 .  Calcium 200 MG TABS, Take by mouth 2 (two) times daily. , Disp: , Rfl:  .  Cholecalciferol (VITAMIN D-1000 MAX ST) 1000 UNITS tablet, Take 5,000 Units by mouth 2 (two) times daily. , Disp: , Rfl:  .  clindamycin (CLEOCIN) 300 MG capsule, Take 2 capsules by mouth as needed. , Disp: , Rfl:  .  Coenzyme Q10 (COQ10) 100 MG CAPS, Take 1 capsule by mouth daily., Disp: , Rfl:  .  diphenhydrAMINE HCl (BENADRYL ALLERGY PO), Take by mouth., Disp: , Rfl:  .  disulfiram (ANTABUSE) 250 MG tablet, , Disp: , Rfl:  .  Magnesium Malate POWD, 1,350 mg by Does not apply route. , Disp: , Rfl:  .  Melatonin 10 MG TABS, Take by mouth daily., Disp: , Rfl:  .  Menaquinone-7 (VITAMIN K2 PO), Take 150 mcg by mouth daily., Disp: , Rfl:  .  Multiple Vitamin (MULTIVITAMIN) tablet, Take 1 tablet by mouth 3 (three) times daily. Celebrate Bariatric, Disp: ,  Rfl:  .  NATURE-THROID 113.75 MG TABS, , Disp: , Rfl:  .  OVER THE COUNTER MEDICATION, 2 (two) times daily., Disp: , Rfl:  .  OVER THE COUNTER MEDICATION, 120 mg 2 (two) times daily., Disp: , Rfl:  .  OVER THE COUNTER MEDICATION, 240 mg 2 (two) times daily., Disp: , Rfl:  .  Probiotic Product (PROBIOTIC PO), Take by mouth daily., Disp: , Rfl:  .  rOPINIRole (REQUIP) 1 MG tablet, Take 1 tablet (1 mg total) by mouth at bedtime., Disp: 30 tablet, Rfl: 0 .  UNABLE TO FIND, Med Name: Iron 29mg  QD, Disp: , Rfl:  .  UNABLE TO FIND, Take 75 mg by mouth 2 (two) times daily. Med Name: Pregnenolone 100mg  QD, Disp: , Rfl:  .  vitamin E 400 UNIT capsule, Take 400 Units by mouth daily. , Disp: , Rfl:   Assessment/ Plan: 53 y.o. female   1. Restless leg syndrome,  controlled Controlled with Requip and medications prescribed to her by her integrative medicine provider.  Requip refilled x6 months.  We discussed that if we can see each other in office within the next 3 to 6 months that would be very helpful as she is overdue for lipid panel.  If her specialist is able to obtain this with her next lab draw that would be helpful.  She will have her most recent laboratory results sent to me. - rOPINIRole (REQUIP) 1 MG tablet; Take 1 tablet (1 mg total) by mouth at bedtime.  Dispense: 90 tablet; Refill: 1  2. Hypothyroidism, unspecified type Monitored and managed by her integrative care provider  3. Asthma Controlled with rare use of albuterol.  Refills have been sent. - albuterol (VENTOLIN HFA) 108 (90 Base) MCG/ACT inhaler; Inhale 2 puffs into the lungs every 6 (six) hours as needed for wheezing.  Dispense: 1 Inhaler; Refill: 4   Start time: 8:00am End time: 8:10am  Total time spent on patient care (including telephone call/ virtual visit): 15 minutes  [place], [place] [phone]

## 2019-01-07 ENCOUNTER — Other Ambulatory Visit: Payer: Self-pay | Admitting: Family Medicine

## 2019-01-07 DIAGNOSIS — G2581 Restless legs syndrome: Secondary | ICD-10-CM

## 2019-03-22 ENCOUNTER — Ambulatory Visit (INDEPENDENT_AMBULATORY_CARE_PROVIDER_SITE_OTHER): Payer: BC Managed Care – PPO | Admitting: Family Medicine

## 2019-03-22 DIAGNOSIS — L819 Disorder of pigmentation, unspecified: Secondary | ICD-10-CM

## 2019-03-22 DIAGNOSIS — G2581 Restless legs syndrome: Secondary | ICD-10-CM

## 2019-03-22 MED ORDER — ROPINIROLE HCL 1 MG PO TABS: 1.0000 mg | ORAL_TABLET | Freq: Every day | ORAL | 3 refills | Status: DC

## 2019-03-22 NOTE — Progress Notes (Signed)
Telephone visit  Subjective: CC: f/u restless legs PCP: Janora Norlander, DO [id number] Candace Rodriguez is a 53 y.o. female calls for telephone consult today. Patient provides verbal consent for consult held via phone.  Location of patient: car Location of provider: Working remotely from home Others present for call: none  1.  Restless leg syndrome Patient reports compliance with Requip 1 mg nightly.  She reports good control of her nighttime symptoms but does have occasionally have breakthrough symptoms during the day, particularly in the afternoon.  She is reluctant to take any medications during the daytime because the Requip makes her very sleepy within about 30 minutes of taking it.  No known family history of Parkinson disorder.  2.  Skin lesion Patient reports a lesion on the left side of the face that is scaly flakes and hurts at times.  She denies any change in shape, size or color.  No spontaneous bleeding.  No personal history or family history of skin cancers but she notes that her mother has several moles that have required excision.  She would like to establish care with Dr. Denna Haggard, as her previous dermatologist in [place] has since retired.   ROS: Per HPI  Allergies  Allergen Reactions  . Avelox [Moxifloxacin]   . Gluten Meal Other (See Comments)    Joint Pain  . Other Other (See Comments)    Dairy intolerance  . Penicillin G Itching and Other (See Comments)    Turns red in color.  . Penicillins   . Quinolones Other (See Comments)    Pt can't remeber  . Soy Allergy Rash    Rash in mouth.   Past Medical History:  Diagnosis Date  . Asthma   . Depression   . DJD (degenerative joint disease)   . Fractures    Left distal radius,  left patella  . Hyperlipidemia   . Hypertension   . Lyme disease   . Migraine   . Obesity   . OSA (obstructive sleep apnea)    Dx by [facility]  . Restless leg syndrome   . Sleep apnea   . Thyroid disease   .  Tinnitus     Current Outpatient Medications:  .  albuterol (VENTOLIN HFA) 108 (90 Base) MCG/ACT inhaler, Inhale 2 puffs into the lungs every 6 (six) hours as needed for wheezing., Disp: 1 Inhaler, Rfl: 4 .  Calcium 200 MG TABS, Take by mouth 2 (two) times daily. , Disp: , Rfl:  .  Cholecalciferol (VITAMIN D-1000 MAX ST) 1000 UNITS tablet, Take 5,000 Units by mouth 2 (two) times daily. , Disp: , Rfl:  .  clindamycin (CLEOCIN) 300 MG capsule, Take 2 capsules by mouth as needed. , Disp: , Rfl:  .  Coenzyme Q10 (COQ10) 100 MG CAPS, Take 1 capsule by mouth daily., Disp: , Rfl:  .  diphenhydrAMINE HCl (BENADRYL ALLERGY PO), Take by mouth., Disp: , Rfl:  .  disulfiram (ANTABUSE) 250 MG tablet, , Disp: , Rfl:  .  Magnesium Malate POWD, 1,350 mg by Does not apply route. , Disp: , Rfl:  .  Melatonin 10 MG TABS, Take by mouth daily., Disp: , Rfl:  .  Menaquinone-7 (VITAMIN K2 PO), Take 150 mcg by mouth daily., Disp: , Rfl:  .  Multiple Vitamin (MULTIVITAMIN) tablet, Take 1 tablet by mouth 3 (three) times daily. Celebrate Bariatric, Disp: , Rfl:  .  NATURE-THROID 130 MG tablet, Take 130 mg by mouth daily., Disp: , Rfl:  .  OVER THE COUNTER MEDICATION, 2 (two) times daily., Disp: , Rfl:  .  OVER THE COUNTER MEDICATION, 120 mg 2 (two) times daily., Disp: , Rfl:  .  OVER THE COUNTER MEDICATION, 240 mg 2 (two) times daily., Disp: , Rfl:  .  Probiotic Product (PROBIOTIC PO), Take by mouth daily., Disp: , Rfl:  .  rOPINIRole (REQUIP) 1 MG tablet, Take 1 tablet (1 mg total) by mouth at bedtime., Disp: 90 tablet, Rfl: 0 .  UNABLE TO FIND, Med Name: Iron 29mg  QD, Disp: , Rfl:  .  UNABLE TO FIND, Take 75 mg by mouth 2 (two) times daily. Med Name: Pregnenolone 100mg  QD, Disp: , Rfl:  .  vitamin E 400 UNIT capsule, Take 400 Units by mouth daily. , Disp: , Rfl:   Assessment/ Plan: 53 y.o. female   1. Restless leg syndrome, controlled Controlled at nighttime but will have some breakthrough daytime symptoms.  We  discussed consideration for neurology eval, particularly symptoms progress.  We discussed possible other etiologies of restless like including Parkinson's.  For now, she would like to proceed with Requip at bedtime only since it makes her sleepy. - rOPINIRole (REQUIP) 1 MG tablet; Take 1 tablet (1 mg total) by mouth at bedtime.  Dispense: 90 tablet; Refill: 3  2. Pigmented skin lesion of uncertain nature She has a lesion on the left side of her face of particular concern but would like a full body evaluation.  Referral placed to Dr. Onalee Hua office. - Ambulatory referral to Dermatology   Start time: 1:25pm End time: 1:37pm  Total time spent on patient care (including telephone call/ virtual visit): 19 minutes  [place], [place] [phone]

## 2019-03-23 ENCOUNTER — Encounter: Payer: Self-pay | Admitting: Family Medicine

## 2019-04-13 DIAGNOSIS — C4491 Basal cell carcinoma of skin, unspecified: Secondary | ICD-10-CM

## 2019-04-13 DIAGNOSIS — D099 Carcinoma in situ, unspecified: Secondary | ICD-10-CM

## 2019-04-13 DIAGNOSIS — C4492 Squamous cell carcinoma of skin, unspecified: Secondary | ICD-10-CM

## 2019-04-13 HISTORY — DX: Squamous cell carcinoma of skin, unspecified: C44.92

## 2019-04-13 HISTORY — DX: Basal cell carcinoma of skin, unspecified: C44.91

## 2019-04-13 HISTORY — DX: Carcinoma in situ, unspecified: D09.9

## 2019-06-29 ENCOUNTER — Telehealth: Payer: Self-pay | Admitting: Family Medicine

## 2019-06-29 NOTE — Telephone Encounter (Signed)
Called [facility] back and would not discuss patient with me without giving patients social security number. Advised that we didn't have access to this information and they advised they couldn't speak with me further

## 2019-07-01 DIAGNOSIS — Z8616 Personal history of COVID-19: Secondary | ICD-10-CM

## 2019-07-01 HISTORY — DX: Personal history of COVID-19: Z86.16

## 2019-07-04 ENCOUNTER — Telehealth: Payer: Self-pay | Admitting: Family Medicine

## 2019-07-05 ENCOUNTER — Encounter: Payer: Self-pay | Admitting: Family Medicine

## 2019-07-05 ENCOUNTER — Telehealth: Payer: Self-pay | Admitting: Family Medicine

## 2019-07-05 NOTE — Telephone Encounter (Signed)
No.  Diagnosis of skin cancer was made in November 2020 by dermatology.  I also spoke to patient.  Ok to release this medical information to [place].

## 2019-07-05 NOTE — Telephone Encounter (Signed)
Left message to call back  

## 2019-07-07 NOTE — Telephone Encounter (Signed)
Left message to call back  

## 2019-07-07 NOTE — Telephone Encounter (Signed)
Candace Rodriguez was contacted with information.

## 2019-07-12 ENCOUNTER — Encounter: Payer: Self-pay | Admitting: Family Medicine

## 2019-07-20 ENCOUNTER — Encounter: Payer: Self-pay | Admitting: *Deleted

## 2019-08-08 NOTE — Progress Notes (Signed)
Office Visit Note  Patient: Candace Rodriguez             Date of Birth: 09-02-1965           MRN: [id number]             PCP: Janora Norlander, DO Referring: No ref. provider found Visit Date: 08/16/2019 Occupation: @GUAROCC @  Subjective:  Pain in both hands   History of Present Illness: Candace Rodriguez is a 54 y.o. female with history of osteoarthritis and gout. She is not taking a urate lowering agent.  She denies any gout flares.  She states she has been experiencing increased arthralgias and joint stiffness for the past 3 days which she attributes to rainy weather.  She is having pain in both hands and on the dorsal aspect of both feet.  She states she has discomfort on the medial aspect of both partial knee replacements. She states her right shoulder joint pain resolved after the cortisone injection on 08/17/18. She denies any joint swelling.  She has been using topical CBD and essential oils.   Activities of Daily Living:  Patient reports morning stiffness for 10  minutes.   Patient Reports nocturnal pain.  Difficulty dressing/grooming: Denies Difficulty climbing stairs: Denies Difficulty getting out of chair: Denies Difficulty using hands for taps, buttons, cutlery, and/or writing: Denies  Review of Systems  Constitutional: Negative for fatigue.  HENT: Negative for mouth sores, mouth dryness and nose dryness.   Eyes: Negative for pain, visual disturbance and dryness.  Respiratory: Negative for cough, hemoptysis, shortness of breath and difficulty breathing.   Cardiovascular: Negative for chest pain, palpitations, hypertension and swelling in legs/feet.  Gastrointestinal: Negative for blood in stool, constipation and diarrhea.  Endocrine: Negative for increased urination.  Genitourinary: Negative for painful urination.  Musculoskeletal: Positive for arthralgias, joint pain and morning stiffness. Negative for joint swelling, myalgias, muscle weakness, muscle tenderness  and myalgias.  Skin: Negative for color change, pallor, rash, hair loss, nodules/bumps, skin tightness, ulcers and sensitivity to sunlight.  Allergic/Immunologic: Negative for susceptible to infections.  Neurological: Negative for dizziness, numbness, headaches and weakness.  Hematological: Negative for swollen glands.  Psychiatric/Behavioral: Negative for depressed mood and sleep disturbance. The patient is not nervous/anxious.     PMFS History:  Patient Active Problem List   Diagnosis Date Noted  . Primary osteoarthritis of both hands 08/16/2018  . DDD (degenerative disc disease), lumbar 08/16/2018  . History of restless legs syndrome 07/24/2016  . History of migraine 07/15/2016  . History of hypothyroidism 07/15/2016  . History of partial knee replacement 07/15/2016  . Hyperbilirubinemia 11/05/2015  . Chronic venous insufficiency 10/15/2015  . Healthcare maintenance 10/15/2015  . OSA (obstructive sleep apnea) 10/25/2014  . PCOS (polycystic ovarian syndrome) 06/12/2014  . Peripheral edema 06/12/2014  . Dyslipidemia 09/13/2012  . Essential hypertension, benign 09/13/2012  . Hypothyroidism 09/13/2012  . Restless leg syndrome, controlled 09/13/2012  . Migraine headache 09/13/2012  . Gout 09/13/2012    Past Medical History:  Diagnosis Date  . Asthma   . Basal cell carcinoma 04/13/2019   left upper back-[id number]  . Depression   . DJD (degenerative joint disease)   . Fractures    Left distal radius,  left patella  . History of COVID-19 07/01/2019  . Hyperlipidemia   . Hypertension   . Lyme disease   . Migraine   . Obesity   . Obesity, morbid ([place]) 09/13/2012  . OSA (obstructive sleep apnea)  Dx by [facility]  . Restless leg syndrome   . SCC (squamous cell carcinoma) 04/13/2019   right side [id number]  . Sleep apnea   . 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 Thyroid disease   . Tinnitus     Family History    Problem Relation Age of Onset  . Arthritis Mother   . Cancer Maternal Grandmother   . Heart disease Maternal Grandmother   . Cancer Maternal Grandfather   . Cancer Paternal Grandmother   . Heart disease Paternal Grandfather   . Arthritis Father   . Hypertension Father   . Atrial fibrillation Brother    Past Surgical History:  Procedure Laterality Date  . CHOLECYSTECTOMY  10/2013  . JOINT REPLACEMENT Bilateral 10/2008    knee each partial replacement  . KNEE SURGERY    . LAPAROSCOPIC ABDOMINAL EXPLORATION  06/1993  . PARTIAL GASTRECTOMY  11/16/2012   VS[facility]   . WRIST SURGERY  09/1998   L   Social History   Social History Narrative  . Not on file   Immunization History  Administered Date(s) Administered  . Influenza,inj,Quad PF,6+ Mos 02/16/2017  . Pneumococcal Polysaccharide-23 03/17/2006, 11/15/2013  . Td 12/23/2007  . Zoster Recombinat (Shingrix) 12/08/2016, 02/16/2017     Objective: Vital Signs: BP 137/85 (BP Location: Left Arm, Patient Position: Sitting, Cuff Size: Small)   Pulse 92   Resp 12   Ht 5\' 8"  (1.727 m)   Wt 242 lb 9.6 oz (110 kg)   BMI 36.89 kg/m    Physical Exam Vitals and nursing note reviewed.  Constitutional:      Appearance: She is well-developed.  HENT:     Head: Normocephalic and atraumatic.  Eyes:     Conjunctiva/sclera: Conjunctivae normal.  Pulmonary:     Effort: Pulmonary effort is normal.  Abdominal:     General: Bowel sounds are normal.     Palpations: Abdomen is soft.  Musculoskeletal:     Cervical back: Normal range of motion.  Lymphadenopathy:     Cervical: No cervical adenopathy.  Skin:    General: Skin is warm and dry.     Capillary Refill: Capillary refill takes less than 2 seconds.  Neurological:     Mental Status: She is alert and oriented to person, place, and time.  Psychiatric:        Behavior: Behavior normal.      Musculoskeletal Exam: C-spine, thoracic spine, and lumbar spine good ROM.  Shoulder joints, elbow  joints, wrist joints, MCPs, PIPs, and DIPs good ROM with no synovitis.  PIP and DIP thickening consistent with osteoarthritis of both hands. CMC thickening bilaterally.  Hip joints, knee joints, ankle joints, MTPs, PIPs, and DIPs good ROM with no synovitis.  No warmth or effusion of knee joints.  No tenderness or inflammation in both ankle joints. Tenderness on the dorsal aspect of both feet.  No tenderness of MTP joints.    CDAI Exam: CDAI Score: -- Patient Global: --; Provider Global: -- Swollen: --; Tender: -- Joint Exam 08/16/2019   No joint exam has been documented for this visit   There is currently no information documented on the homunculus. Go to the Rheumatology activity and complete the homunculus joint exam.  Investigation: No additional findings.  Imaging: No results found.  Recent Labs: Lab Results  Component Value Date   WBC 6.1 09/04/2017   HGB 15.1 09/04/2017   PLT 203 09/04/2017   NA 143  09/04/2017   K 4.5 09/04/2017   CL 104 09/04/2017   CO2 26 09/04/2017   GLUCOSE 79 09/04/2017   BUN 12 09/04/2017   CREATININE 0.63 09/04/2017   BILITOT 0.8 09/04/2017   ALKPHOS 82 09/04/2017   AST 16 09/04/2017   ALT 19 09/04/2017   PROT 6.2 09/04/2017   ALBUMIN 4.2 09/04/2017   CALCIUM 9.5 09/04/2017   GFRAA 119 09/04/2017    Speciality Comments: No specialty comments available.  Procedures:  No procedures performed Allergies: Avelox [moxifloxacin], Gluten meal, Other, Penicillin g, Penicillins, Quinolones, and Soy allergy     Assessment / Plan:     Visit Diagnoses: Pain in joint of right shoulder - Her right shoulder joint pain has resolved.  She had a cortisone injection on 08/17/18, which alleviated her discomfort.  She has good ROM of the right shoulder joint on exam.  She was advised to notify us if her discomfort returns.  Idiopathic chronic gout of multiple sites without tophus - She has not had any recent gout flares.  She is not taking a urate lowering  agent at this time.  Her uric acid was 4.5 on 08/17/2018.  We will recheck uric acid level today.  She was advised to notify us if she develops any signs or symptoms of a gout flare.  Primary osteoarthritis of both hands: She has PIP and DIP thickening consistent with osteoarthritis of both hands.  She has [place] joint prominence bilaterally.  No synovitis was noted.  She has complete fist formation bilaterally.  She experiences intermittent discomfort in both hands but no joint swelling.  Joint protection and muscle strengthening were discussed.  History of bilateral partial knee replacement: She experiences discomfort on the medial aspect of both partial knee replacements.  Warmth but no effusion noted. She has occasional difficulty climbing steps and getting up from a chair if she is experiencing increased joint stiffness.   DDD (degenerative disc disease), lumbar: She experiences intermittent lower back pain and occasional nocturnal pain in her lower back.  She has no symptoms of radiculopathy.    Other medical conditions are listed as follows:   Essential hypertension, benign  History of hypothyroidism  History of migraine  Hypothyroidism, unspecified type  History of restless legs syndrome  OSA (obstructive sleep apnea)  Dyslipidemia  Orders: Orders Placed This Encounter  Procedures  . Uric acid   No orders of the defined types were placed in this encounter.    Follow-Up Instructions: Return in about 1 year (around 08/15/2020) for Gout, Osteoarthritis, DDD.   Ofilia Neas, PA-C   I examined and evaluated the patient with Hazel Sams PA.  Patient continues to have some discomfort due to underlying osteoarthritis.  She has not had any gout flare.  We will check a uric acid today.  She had no synovitis on examination.  The plan of care was discussed as noted above.  Bo Merino, MD  Note - This record has been created using Editor, commissioning.  Chart creation errors have  been sought, but may not always  have been located. Such creation errors do not reflect on  the standard of medical care.

## 2019-08-09 ENCOUNTER — Other Ambulatory Visit: Payer: Self-pay

## 2019-08-09 ENCOUNTER — Ambulatory Visit: Payer: BC Managed Care – PPO | Admitting: Dermatology

## 2019-08-09 DIAGNOSIS — L819 Disorder of pigmentation, unspecified: Secondary | ICD-10-CM | POA: Diagnosis not present

## 2019-08-09 DIAGNOSIS — Z85828 Personal history of other malignant neoplasm of skin: Secondary | ICD-10-CM | POA: Diagnosis not present

## 2019-08-09 DIAGNOSIS — L570 Actinic keratosis: Secondary | ICD-10-CM | POA: Diagnosis not present

## 2019-08-09 MED ORDER — IMIQUIMOD 5 % EX CREA: TOPICAL_CREAM | CUTANEOUS | 0 refills | Status: DC

## 2019-08-09 NOTE — Progress Notes (Addendum)
   Follow-Up Visit   Subjective  Candace Rodriguez is a 54 y.o. female who presents for the following: Basal Cell Carcinoma (3 mth f/u- L upper  Back-no concerns,L cheek-no concers, L side nose- still scale- ) and Squamous Cell Carcinoma.  Skin cancers Location: Face Duration: Months Quality: Smooth with occasional scale Associated Signs/Symptoms: Modifying Factors:  Severity:  Timing: Context:   The following portions of the chart were reviewed this encounter and updated as appropriate:     Objective  Well appearing patient in no apparent distress; mood and affect are within normal limits.  All sun exposed areas plus back examined. Skin examination was limited to the back head and neck.  The previous skin cancers are smooth with notable hyperpigmentation, but the patient notices periodic crusting on the spot on left cheek.  There are also 4 small satellite crusts around the pigmented area on her left cheek.  I detailed how to use imiquimod on this area; she will apply the ointment Monday and Wednesday and Friday 3 times weekly for a maximum of 6 weeks.  She understands that she may see brisk inflammation in which case she can discontinue the application.  She was advised to poke a pinhole in the small container rather than throwing it out after each application and she also may check the cost on good [email or web address] for cash pay versus what they will charge her with insurance.  If the area is perfectly smooth and 3 months she may cancel her scheduled visit.  We will discuss in the late fall or winter the possibility of topical fluorouracil as well as treatment for residual hyperpigmentation.                                                                   Objective  Left Buccal Cheek , Left Malar Cheek: Erythematous patches with gritty scale.  Assessment & Plan  AK (actinic keratosis) (2) Left Malar Cheek; Left Buccal Cheek   Topical imiquimod MWF for six weeks or until brisk  inflammation

## 2019-08-16 ENCOUNTER — Encounter: Payer: Self-pay | Admitting: Rheumatology

## 2019-08-16 ENCOUNTER — Ambulatory Visit: Payer: BC Managed Care – PPO | Admitting: Rheumatology

## 2019-08-16 ENCOUNTER — Other Ambulatory Visit: Payer: Self-pay

## 2019-08-16 VITALS — BP 137/85 | HR 92 | Resp 12 | Ht 68.0 in | Wt 242.6 lb

## 2019-08-16 DIAGNOSIS — Z96659 Presence of unspecified artificial knee joint: Secondary | ICD-10-CM

## 2019-08-16 DIAGNOSIS — M19041 Primary osteoarthritis, right hand: Secondary | ICD-10-CM

## 2019-08-16 DIAGNOSIS — M5136 Other intervertebral disc degeneration, lumbar region: Secondary | ICD-10-CM

## 2019-08-16 DIAGNOSIS — Z8639 Personal history of other endocrine, nutritional and metabolic disease: Secondary | ICD-10-CM

## 2019-08-16 DIAGNOSIS — E039 Hypothyroidism, unspecified: Secondary | ICD-10-CM

## 2019-08-16 DIAGNOSIS — M19042 Primary osteoarthritis, left hand: Secondary | ICD-10-CM

## 2019-08-16 DIAGNOSIS — M25511 Pain in right shoulder: Secondary | ICD-10-CM | POA: Diagnosis not present

## 2019-08-16 DIAGNOSIS — M1A09X0 Idiopathic chronic gout, multiple sites, without tophus (tophi): Secondary | ICD-10-CM | POA: Diagnosis not present

## 2019-08-16 DIAGNOSIS — Z8669 Personal history of other diseases of the nervous system and sense organs: Secondary | ICD-10-CM

## 2019-08-16 DIAGNOSIS — G4733 Obstructive sleep apnea (adult) (pediatric): Secondary | ICD-10-CM

## 2019-08-16 DIAGNOSIS — E785 Hyperlipidemia, unspecified: Secondary | ICD-10-CM

## 2019-08-16 DIAGNOSIS — M51369 Other intervertebral disc degeneration, lumbar region without mention of lumbar back pain or lower extremity pain: Secondary | ICD-10-CM

## 2019-08-16 DIAGNOSIS — I10 Essential (primary) hypertension: Secondary | ICD-10-CM

## 2019-08-17 LAB — URIC ACID: Uric Acid, Serum: 5.2 mg/dL (ref 2.5–7.0)

## 2019-08-17 NOTE — Progress Notes (Signed)
Uric acid is within the desirable range.

## 2019-11-02 ENCOUNTER — Ambulatory Visit: Payer: BC Managed Care – PPO | Admitting: Dermatology

## 2019-11-10 ENCOUNTER — Other Ambulatory Visit: Payer: Self-pay

## 2019-11-10 DIAGNOSIS — J4520 Mild intermittent asthma, uncomplicated: Secondary | ICD-10-CM

## 2019-11-10 MED ORDER — ALBUTEROL SULFATE HFA 108 (90 BASE) MCG/ACT IN AERS: 2.0000 | INHALATION_SPRAY | Freq: Four times a day (QID) | RESPIRATORY_TRACT | 0 refills | Status: DC | PRN

## 2020-02-20 ENCOUNTER — Ambulatory Visit: Payer: BC Managed Care – PPO | Admitting: Dermatology

## 2020-02-20 ENCOUNTER — Encounter: Payer: Self-pay | Admitting: Dermatology

## 2020-02-20 ENCOUNTER — Other Ambulatory Visit: Payer: Self-pay

## 2020-02-20 DIAGNOSIS — L570 Actinic keratosis: Secondary | ICD-10-CM

## 2020-02-20 DIAGNOSIS — L821 Other seborrheic keratosis: Secondary | ICD-10-CM

## 2020-02-20 DIAGNOSIS — Z1283 Encounter for screening for malignant neoplasm of skin: Secondary | ICD-10-CM

## 2020-02-20 DIAGNOSIS — X32XXXA Exposure to sunlight, initial encounter: Secondary | ICD-10-CM

## 2020-02-20 NOTE — Patient Instructions (Addendum)
Routine follow-up for Candace Rodriguez date of birth October 24, 1965.  All previous skin cancers are clear, with the facial lesions healing with virtually no scar, minor scar and discoloration on the left upper back.  The remaining spots on the back are all clinically benign seborrheic keratoses which are safe to leave.  There are no atypical moles.  [name] does have multiple small solar keratoses on her face which I encouraged her not to spot treat with freezing or biopsies.  She will figure out a time to do PDT in the late fall or winter she is aware that she may have some redness and peeling for an average of a week she will try to avoid all sun exposure for 2 days after the treatment. if there is any glitch in the scheduling she knows that she could call and we will reschedule.

## 2020-03-21 NOTE — Progress Notes (Signed)
   Follow-Up Visit   Subjective  Candace Rodriguez is a 54 y.o. female who presents for the following: Follow-up (67mo left cheek still has scale previous ln2 in march 2021, also recheck back).  Follow up Location:  Duration:  Quality:  Associated Signs/Symptoms: Modifying Factors:  Severity:  Timing: Context:   Objective  Well appearing patient in no apparent distress; mood and affect are within normal limits.  A full examination was performed including scalp, head, eyes, ears, nose, lips, neck, chest, axillae, abdomen, back, buttocks, bilateral upper extremities, bilateral lower extremities, hands, feet, fingers, toes, fingernails, and toenails. All findings within normal limits unless otherwise noted below.  Routine follow-up for Candace Rodriguez date of birth 1965/11/10.  All previous skin cancers are clear, with the facial lesions healing with virtually no scar, minor scar and discoloration on the left upper back.  The remaining spots on the back are all clinically benign seborrheic keratoses which are safe to leave.  There are no atypical moles.  Candace Rodriguez does have multiple small solar keratoses on her face which I encouraged her not to spot treat with freezing or biopsies.  She will figure out a time to do PDT in the late fall or winter she is aware that she may have some redness and peeling for an average of a week she will try to avoid all sun exposure for 2 days after the treatment. if there is any glitch in the scheduling she knows that she could call and we will reschedule.  Assessment & Plan    Encounter for screening for malignant neoplasm of skin Mid Back  Yearly skin exams. Previous skin cancers are clear.   Seborrheic keratosis Mid Back  No treatment needed. Benign lesions.  Solar keratosis Head - Anterior (Face)  Patient is planning to do a PDT in the fall/ winter to treat her face.      I, Lavonna Monarch, MD, have reviewed all documentation for  this visit.  The documentation on 03/25/20 for the exam, diagnosis, procedures, and orders are all accurate and complete.

## 2020-03-25 ENCOUNTER — Encounter: Payer: Self-pay | Admitting: Dermatology

## 2020-03-27 ENCOUNTER — Other Ambulatory Visit: Payer: Self-pay

## 2020-03-27 ENCOUNTER — Ambulatory Visit (INDEPENDENT_AMBULATORY_CARE_PROVIDER_SITE_OTHER): Payer: BC Managed Care – PPO | Admitting: Family Medicine

## 2020-03-27 DIAGNOSIS — J209 Acute bronchitis, unspecified: Secondary | ICD-10-CM | POA: Diagnosis not present

## 2020-03-27 DIAGNOSIS — J4520 Mild intermittent asthma, uncomplicated: Secondary | ICD-10-CM

## 2020-03-27 DIAGNOSIS — J45909 Unspecified asthma, uncomplicated: Secondary | ICD-10-CM

## 2020-03-27 MED ORDER — ALBUTEROL SULFATE HFA 108 (90 BASE) MCG/ACT IN AERS: 2.0000 | INHALATION_SPRAY | Freq: Four times a day (QID) | RESPIRATORY_TRACT | 0 refills | Status: DC | PRN

## 2020-03-27 MED ORDER — AZITHROMYCIN 250 MG PO TABS: ORAL_TABLET | ORAL | 0 refills | Status: DC

## 2020-03-27 MED ORDER — PROMETHAZINE-DM 6.25-15 MG/5ML PO SYRP: 2.5000 mL | ORAL_SOLUTION | Freq: Four times a day (QID) | ORAL | 0 refills | Status: DC | PRN

## 2020-03-27 NOTE — Progress Notes (Signed)
Telephone visit  Subjective: CC: URI cough PCP: Janora Norlander, DO WNU:[id number] R Candace Rodriguez is a 54 y.o. female calls for telephone consult today. Patient provides verbal consent for consult held via phone.  Due to COVID-19 pandemic this visit was conducted virtually. This visit type was conducted due to national recommendations for restrictions regarding the COVID-19 Pandemic (e.g. social distancing, sheltering in place) in an effort to limit this patient's exposure and mitigate transmission in our community. All issues noted in this document were discussed and addressed.  A physical exam was not performed with this format.   Location of patient: home Location of provider: [facility] Others present for call: none  1. URI with cough Patient reports she has been around several sick preschoolers.  She started with a sore throat on Friday.  She reports drainage. Symptoms became worse on Saturday.  Denies fevers, hemoptysis, shortness of breath or wheezing. She reported progression of cough and did a virtual visit with [facility] and afrin/ pseudofed recommended.  She notes the prescribed cough medication was not effective.  She's been using her inhaler.  Cough is productive with yellow phlegm.  She has not been vaccinated. She has had COVID infection previously.   ROS: Per HPI  Allergies  Allergen Reactions  . Avelox [Moxifloxacin]   . Gluten Meal Other (See Comments)    Joint Pain  . Other Other (See Comments)    Dairy intolerance  . Penicillin G Itching and Other (See Comments)    Turns red in color.  . Penicillins   . Quinolones Other (See Comments)    Pt can't remeber  . Soy Allergy Rash    Rash in mouth.   Past Medical History:  Diagnosis Date  . Asthma   . Basal cell carcinoma 04/13/2019   left upper back-cx3 44fu  . Depression   . DJD (degenerative joint disease)   . Fractures    Left distal radius,  left patella  . History of COVID-19 07/01/2019  . Hyperlipidemia   .  Hypertension   . Lyme disease   . Migraine   . Obesity   . Obesity, morbid ([place]) 09/13/2012  . OSA (obstructive sleep apnea)    Dx by [facility]  . Restless leg syndrome   . SCC (squamous cell carcinoma) 04/13/2019   right side nose-[id number]  . Sleep apnea   . 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 Thyroid disease   . Tinnitus     Current Outpatient Medications:  .  albuterol (VENTOLIN HFA) 108 (90 Base) MCG/ACT inhaler, Inhale 2 puffs into the lungs every 6 (six) hours as needed for wheezing. Needs to be seen for further refills., Disp: 18 g, Rfl: 0 .  Calcium 200 MG TABS, Take by mouth 2 (two) times daily. , Disp: , Rfl:  .  Cholecalciferol (VITAMIN D-1000 MAX ST) 1000 UNITS tablet, Take 5,000 Units by mouth 2 (two) times daily. , Disp: , Rfl:  .  clindamycin (CLEOCIN) 300 MG capsule, Take 2 capsules by mouth as needed. , Disp: , Rfl:  .  Coenzyme Q10 (COQ10) 100 MG CAPS, Take 1 capsule by mouth daily., Disp: , Rfl:  .  diphenhydrAMINE HCl (BENADRYL ALLERGY PO), Take by mouth., Disp: , Rfl:  .  imiquimod (ALDARA) 5 % cream, Apply topically 3 (three) times a week. Total treatment 6 weeks 28 applications, Disp: 12 each, Rfl: 0 .  Magnesium Malate POWD, 1,350 mg by Does not apply  route. , Disp: , Rfl:  .  Melatonin 10 MG TABS, Take by mouth daily., Disp: , Rfl:  .  Menaquinone-7 (VITAMIN K2 PO), Take 150 mcg by mouth daily., Disp: , Rfl:  .  Multiple Vitamin (MULTIVITAMIN) tablet, Take 1 tablet by mouth 3 (three) times daily. Celebrate Bariatric, Disp: , Rfl:  .  OVER THE COUNTER MEDICATION, 2 (two) times daily., Disp: , Rfl:  .  OVER THE COUNTER MEDICATION, 120 mg 2 (two) times daily., Disp: , Rfl:  .  OVER THE COUNTER MEDICATION, 240 mg 2 (two) times daily., Disp: , Rfl:  .  Probiotic Product (PROBIOTIC PO), Take by mouth daily., Disp: , Rfl:  .  rOPINIRole (REQUIP) 1 MG tablet, Take 1 tablet (1 mg total) by mouth at bedtime., Disp:  90 tablet, Rfl: 3 .  thyroid (NP THYROID) 90 MG tablet, Take 90 mg by mouth daily., Disp: , Rfl:  .  UNABLE TO FIND, Med Name: Iron 29mg  QD, Disp: , Rfl:  .  UNABLE TO FIND, Take 75 mg by mouth 2 (two) times daily. Med Name: Pregnenolone 100mg  QD, Disp: , Rfl:  .  vitamin E 400 UNIT capsule, Take 400 Units by mouth daily. , Disp: , Rfl:   Pulm: intermittent coughing during phone appt. No dyspnea with speech  Assessment/ Plan: 54 y.o. female   1. Acute bronchitis with asthma She surely has some type of viral process ongoing.  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  However, given progression of symptoms and known asthma, will empirically treat with oral antibiotics.  A cough syrup has been sent to the pharmacy.  I encouraged her to continue using her albuterol inhaler.  I have placed a future order for Covid testing given lack of vaccination and uncertainty of ongoing immunity in the setting of previous Covid infection.  She understands red flag signs and symptoms warranting further evaluation.  She will contact me if symptoms are not improving - azithromycin (ZITHROMAX) 250 MG tablet; Take 2 tablets today, then take 1 tablet daily until gone.  Dispense: 6 tablet; Refill: 0 - promethazine-dextromethorphan (PROMETHAZINE-DM) 6.25-15 MG/5ML syrup; Take 2.5 mLs by mouth 4 (four) times daily as needed for cough.  Dispense: 118 mL; Refill: 0 - albuterol (VENTOLIN HFA) 108 (90 Base) MCG/ACT inhaler; Inhale 2 puffs into the lungs every 6 (six) hours as needed for wheezing or shortness of breath.  Dispense: 18 g; Refill: 0 - Novel Coronavirus, NAA ([facility]); Future  2. Mild intermittent asthma without complication - albuterol (VENTOLIN HFA) 108 (90 Base) MCG/ACT inhaler; Inhale 2 puffs into the lungs every 6 (six) hours as needed for wheezing or shortness of breath.  Dispense: 18 g; Refill: 0   Start time: 12:52pm End time: 1:03pm  Total time spent on  patient care (including telephone call/ virtual visit): 11 minutes  [place], [place] [phone]

## 2020-03-29 NOTE — Addendum Note (Signed)
Addended by: Liliane Bade on: 03/29/2020 03:42 PM   Modules accepted: Orders

## 2020-03-30 LAB — NOVEL CORONAVIRUS, NAA: SARS-CoV-2, NAA: NOT DETECTED

## 2020-03-30 LAB — SARS-COV-2, NAA 2 DAY TAT

## 2020-05-14 ENCOUNTER — Other Ambulatory Visit: Payer: Self-pay

## 2020-05-14 ENCOUNTER — Ambulatory Visit (INDEPENDENT_AMBULATORY_CARE_PROVIDER_SITE_OTHER): Payer: BC Managed Care – PPO

## 2020-05-14 DIAGNOSIS — L570 Actinic keratosis: Secondary | ICD-10-CM

## 2020-05-14 NOTE — Progress Notes (Signed)
Patient was here today for PDT on her face, patient has a soy allergy.  Per ST do a test spot in patient's elbow crease under occlusion until Wednesday and give the office a call back to reschedule PDT if no reaction.

## 2020-05-14 NOTE — Patient Instructions (Signed)

## 2020-05-16 ENCOUNTER — Telehealth: Payer: Self-pay | Admitting: *Deleted

## 2020-05-16 NOTE — Telephone Encounter (Signed)
Patient called to say she did not react to the [facility] medication for PDT. We rescheduled for Tuesday at 8 am 05/22/20.

## 2020-05-22 ENCOUNTER — Other Ambulatory Visit: Payer: Self-pay

## 2020-05-22 ENCOUNTER — Ambulatory Visit (INDEPENDENT_AMBULATORY_CARE_PROVIDER_SITE_OTHER): Payer: BC Managed Care – PPO

## 2020-05-22 DIAGNOSIS — L570 Actinic keratosis: Secondary | ICD-10-CM

## 2020-05-22 MED ORDER — AMINOLEVULINIC ACID HCL 10 % EX GEL
2000.0000 mg | Freq: Once | CUTANEOUS | Status: AC
  Administered 2020-05-22: 08:00:00 2000 mg via TOPICAL

## 2020-05-22 NOTE — Patient Instructions (Signed)

## 2020-05-22 NOTE — Progress Notes (Signed)
Photodynamic Therapy Procedure Note Diagnosis: Actinic keratosis Location: Face Informed Consent: Discussed risks (burning, pain, redness, peeling, severe sunburn-like reaction, blistering, discoloration, lack of resolution) and benefits of the procedure, as well as the alternatives. Informed consent was obtained. Preparation: After cleansing the skin, the area to be treated was coated with Ameluz.  This was allowed to sit on the skin for 90 minutes. Procedure Details: The patient was placed under the light source with appropriate eye protection for 16.24 minutes. After completing the treatment, the patient applied sunscreen to the treated areas. Patient tolerated the procedure well Plan: Avoid any sun exposure for the next 24 hours. Wear sunscreen daily for the next week. Observe normal sun precautions thereafter. Recommend OTC analgesia as needed for pain. Follow-up in 10 weeks.

## 2020-07-31 ENCOUNTER — Ambulatory Visit: Payer: BC Managed Care – PPO | Admitting: Dermatology

## 2020-08-28 ENCOUNTER — Ambulatory Visit: Payer: BC Managed Care – PPO | Admitting: Rheumatology

## 2020-09-04 ENCOUNTER — Ambulatory Visit: Payer: BC Managed Care – PPO | Admitting: Rheumatology

## 2020-09-05 NOTE — Progress Notes (Signed)
Office Visit Note  Patient: Candace Rodriguez             Date of Birth: 1966-01-09           MRN: [id number]             PCP: Janora Norlander, DO Referring: Janora Norlander, DO Visit Date: 09/19/2020 Occupation: @GUAROCC @  Subjective:  Other ("cyst" on left foot )   History of Present Illness: Candace Rodriguez is a 55 y.o. female with history of osteoarthritis.  She states she continues to have some pain and stiffness in her hands.  Her bilateral knee joints are replaced.  She has been going to the gym on a regular basis and noticing some discomfort in her knees.  Although she has noticed improvement in the muscle strength.  Right shoulder joint is doing better.  For the last 2 months she is also noticed some discomfort in her left foot and also a possible cyst in that area.  She states she has difficulty bending her left toes.  She has not had a gout flare in many years.  Activities of Daily Living:  Patient reports morning stiffness for 5-10 minutes.    Patient Denies nocturnal pain.   Difficulty dressing/grooming: Denies Difficulty climbing stairs: Denies Difficulty getting out of chair: Denies Difficulty using hands for taps, buttons, cutlery, and/or writing: Denies  Review of Systems  Constitutional: Positive for fatigue.  HENT: Negative for mouth sores, mouth dryness and nose dryness.   Eyes: Positive for dryness. Negative for pain and itching.  Respiratory: Positive for shortness of breath. Negative for difficulty breathing.   Cardiovascular: Negative for chest pain and palpitations.  Gastrointestinal: Negative for blood in stool, constipation and diarrhea.  Endocrine: Negative for increased urination.  Genitourinary: Negative for difficulty urinating.  Musculoskeletal: Positive for arthralgias, joint pain and morning stiffness. Negative for joint swelling, myalgias, muscle tenderness and myalgias.  Skin: Negative for color change and rash.   Allergic/Immunologic: Negative for susceptible to infections.  Neurological: Positive for numbness. Negative for dizziness, headaches, memory loss and weakness.  Hematological: Negative for bruising/bleeding tendency.  Psychiatric/Behavioral: Negative for confusion.    PMFS History:  Patient Active Problem List   Diagnosis Date Noted  . Primary osteoarthritis of both hands 08/16/2018  . DDD (degenerative disc disease), lumbar 08/16/2018  . History of restless legs syndrome 07/24/2016  . History of migraine 07/15/2016  . History of hypothyroidism 07/15/2016  . History of partial knee replacement 07/15/2016  . Hyperbilirubinemia 11/05/2015  . Chronic venous insufficiency 10/15/2015  . Healthcare maintenance 10/15/2015  . OSA (obstructive sleep apnea) 10/25/2014  . PCOS (polycystic ovarian syndrome) 06/12/2014  . Peripheral edema 06/12/2014  . Dyslipidemia 09/13/2012  . Essential hypertension, benign 09/13/2012  . Hypothyroidism 09/13/2012  . Restless leg syndrome, controlled 09/13/2012  . Migraine headache 09/13/2012  . Gout 09/13/2012    Past Medical History:  Diagnosis Date  . Asthma   . Basal cell carcinoma 04/13/2019   left upper back-cx3 42fu  . Depression   . DJD (degenerative joint disease)   . Fractures    Left distal radius,  left patella  . History of COVID-19 07/01/2019  . Hyperlipidemia   . Hypertension   . Lyme disease   . Migraine   . Obesity   . Obesity, morbid ([place]) 09/13/2012  . OSA (obstructive sleep apnea)    [facility]x by [facility]  . Restless leg syndrome   . SCC (squamous cell  carcinoma) 04/13/2019   right side nose-cx84fu  . Sleep apnea   . 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 Thyroid disease   . Tinnitus     Family History  Problem Relation Age of Onset  . Arthritis Mother   . Cancer Maternal Grandmother   . Heart disease Maternal Grandmother   . Cancer Maternal Grandfather   . Cancer  Paternal Grandmother   . Heart disease Paternal Grandfather   . Arthritis Father   . Hypertension Father   . Atrial fibrillation Brother    Past Surgical History:  Procedure Laterality Date  . CHOLECYSTECTOMY  10/2013  . JOINT REPLACEMENT Bilateral 10/2008    knee each partial replacement  . KNEE SURGERY    . LAPAROSCOPIC ABDOMINAL EXPLORATION  06/1993  . PARTIAL GASTRECTOMY  11/16/2012   VS[facility]   . WRIST SURGERY  09/1998   L   Social History   Social History Narrative  . Not on file   Immunization History  Administered Date(s) Administered  . Influenza,inj,Quad PF,6+ Mos 02/16/2017  . Pneumococcal Polysaccharide-23 03/17/2006, 11/15/2013  . Td 12/23/2007  . Zoster Recombinat (Shingrix) 12/08/2016, 02/16/2017     Objective: Vital Signs: BP 120/80 (BP Location: Left Arm, Patient Position: Sitting, Cuff Size: Large)   Pulse 77   Resp 15   Ht 5\' 8"  (1.727 m)   Wt 256 lb 6.4 oz (116.3 kg)   BMI 38.99 kg/m    Physical Exam Vitals and nursing note reviewed.  Constitutional:      Appearance: She is well-developed.  HENT:     Head: Normocephalic and atraumatic.  Eyes:     Conjunctiva/sclera: Conjunctivae normal.  Cardiovascular:     Rate and Rhythm: Normal rate and regular rhythm.     Heart sounds: Normal heart sounds.  Pulmonary:     Effort: Pulmonary effort is normal.     Breath sounds: Normal breath sounds.  Abdominal:     General: Bowel sounds are normal.     Palpations: Abdomen is soft.  Musculoskeletal:     Cervical back: Normal range of motion.  Lymphadenopathy:     Cervical: No cervical adenopathy.  Skin:    General: Skin is warm and dry.     Capillary Refill: Capillary refill takes less than 2 seconds.  Neurological:     Mental Status: She is alert and oriented to person, place, and time.  Psychiatric:        Behavior: Behavior normal.      Musculoskeletal Exam: C-spine smooth range of motion.  Shoulder joints, elbow joints, wrist joints with good  range of motion.  She had bilateral CMC PIP and DIP thickening consistent with osteoarthritis.  No synovitis was noted.  Hip joints and knee joints with good range of motion.  She had bilateral partial knee replacement which is doing well.  She had difficulty flexing her second and third toe.  She has a soft tissue mass on top of her second and third metatarsals which is mildly tender.  No joint tenderness was noted.  CDAI Exam: CDAI Score: -- Patient Global: --; Provider Global: -- Swollen: --; Tender: -- Joint Exam 09/19/2020   No joint exam has been documented for this visit   There is currently no information documented on the homunculus. Go to the Rheumatology activity and complete the homunculus joint exam.  Investigation: No additional findings.  Imaging: No results found.  Recent Labs: Lab Results  Component Value Date  WBC 6.1 09/04/2017   HGB 15.1 09/04/2017   PLT 203 09/04/2017   NA 143 09/04/2017   K 4.5 09/04/2017   CL 104 09/04/2017   CO2 26 09/04/2017   GLUCOSE 79 09/04/2017   BUN 12 09/04/2017   CREATININE 0.63 09/04/2017   BILITOT 0.8 09/04/2017   ALKPHOS 82 09/04/2017   AST 16 09/04/2017   ALT 19 09/04/2017   PROT 6.2 09/04/2017   ALBUMIN 4.2 09/04/2017   CALCIUM 9.5 09/04/2017   GFRAA 119 09/04/2017    Speciality Comments: No specialty comments available.  Procedures:  No procedures performed Allergies: Avelox [moxifloxacin], Gluten meal, Other, Penicillin g, Penicillins, Quinolones, and Soy allergy   Assessment / Plan:     Visit Diagnoses: Pain in joint of right shoulder-improved, she had good range of motion.  Primary osteoarthritis of both hands-she had bilateral CMC PIP and DIP thickening consistent with osteoarthritis.  No synovitis was noted.  Joint protection muscle strengthening was discussed.  History of bilateral partial knee replacement-doing well.  She has been having some discomfort in her knee joints that she has been working out  on a regular basis.  Although she has noticed improvement in her muscle strength.  I have encouraged her to continue exercising.  Left foot pain-she complains of discomfort on the dorsum of her left foot.  There was a soft mass at the base of her second and third metatarsal.  She sees Dr. Irving Shows.  Have advised her to schedule an appointment with them.  DDD (degenerative disc disease), lumbar-she denies any discomfort currently.  Idiopathic chronic gout of multiple sites without tophus-uric acid has been normal.  She has not had any gout flare in many years.  Essential hypertension, benign-blood pressure is normal today.  Dyslipidemia  History of migraine  History of hypothyroidism  History of restless legs syndrome  OSA (obstructive sleep apnea)  Orders: No orders of the defined types were placed in this encounter.  No orders of the defined types were placed in this encounter.    Follow-Up Instructions: Return for Osteoarthritis, Gout.   Bo Merino, MD  Note - This record has been created using Editor, commissioning.  Chart creation errors have been sought, but may not always  have been located. Such creation errors do not reflect on  the standard of medical care.

## 2020-09-18 ENCOUNTER — Other Ambulatory Visit: Payer: Self-pay

## 2020-09-18 ENCOUNTER — Encounter: Payer: Self-pay | Admitting: Dermatology

## 2020-09-18 ENCOUNTER — Ambulatory Visit: Payer: BC Managed Care – PPO | Admitting: Dermatology

## 2020-09-18 DIAGNOSIS — Z86007 Personal history of in-situ neoplasm of skin: Secondary | ICD-10-CM

## 2020-09-18 DIAGNOSIS — L300 Nummular dermatitis: Secondary | ICD-10-CM | POA: Diagnosis not present

## 2020-09-18 DIAGNOSIS — L570 Actinic keratosis: Secondary | ICD-10-CM | POA: Diagnosis not present

## 2020-09-18 MED ORDER — BETAMETHASONE DIPROPIONATE AUG 0.05 % EX OINT
TOPICAL_OINTMENT | CUTANEOUS | 1 refills | Status: DC
Start: 2020-09-18 — End: 2021-07-17

## 2020-09-18 MED ORDER — KLISYRI 1 % EX OINT: 1.0000 "application " | TOPICAL_OINTMENT | Freq: Every evening | CUTANEOUS | 0 refills | Status: DC

## 2020-09-18 NOTE — Patient Instructions (Addendum)
[facility] [employer] tomorrow if don't hear from them by the end of the day today [phone]

## 2020-09-19 ENCOUNTER — Ambulatory Visit: Payer: BC Managed Care – PPO | Admitting: Rheumatology

## 2020-09-19 ENCOUNTER — Encounter: Payer: Self-pay | Admitting: Rheumatology

## 2020-09-19 VITALS — BP 120/80 | HR 77 | Resp 15 | Ht 68.0 in | Wt 256.4 lb

## 2020-09-19 DIAGNOSIS — M5136 Other intervertebral disc degeneration, lumbar region: Secondary | ICD-10-CM | POA: Diagnosis not present

## 2020-09-19 DIAGNOSIS — M19042 Primary osteoarthritis, left hand: Secondary | ICD-10-CM

## 2020-09-19 DIAGNOSIS — Z8669 Personal history of other diseases of the nervous system and sense organs: Secondary | ICD-10-CM

## 2020-09-19 DIAGNOSIS — M19041 Primary osteoarthritis, right hand: Secondary | ICD-10-CM | POA: Diagnosis not present

## 2020-09-19 DIAGNOSIS — Z96659 Presence of unspecified artificial knee joint: Secondary | ICD-10-CM

## 2020-09-19 DIAGNOSIS — M25511 Pain in right shoulder: Secondary | ICD-10-CM

## 2020-09-19 DIAGNOSIS — E039 Hypothyroidism, unspecified: Secondary | ICD-10-CM

## 2020-09-19 DIAGNOSIS — G4733 Obstructive sleep apnea (adult) (pediatric): Secondary | ICD-10-CM

## 2020-09-19 DIAGNOSIS — I10 Essential (primary) hypertension: Secondary | ICD-10-CM

## 2020-09-19 DIAGNOSIS — M79672 Pain in left foot: Secondary | ICD-10-CM

## 2020-09-19 DIAGNOSIS — E785 Hyperlipidemia, unspecified: Secondary | ICD-10-CM

## 2020-09-19 DIAGNOSIS — M1A09X0 Idiopathic chronic gout, multiple sites, without tophus (tophi): Secondary | ICD-10-CM

## 2020-09-19 DIAGNOSIS — Z8639 Personal history of other endocrine, nutritional and metabolic disease: Secondary | ICD-10-CM

## 2020-09-29 ENCOUNTER — Encounter: Payer: Self-pay | Admitting: Dermatology

## 2020-09-29 NOTE — Progress Notes (Signed)
   Follow-Up Visit   Subjective  Candace Rodriguez is a 55 y.o. female who presents for the following: Follow-up (Left cheek still raised crusty lesions PDT positive reaction).  Persistent crust left cheek Location:  Duration:  Quality:  Associated Signs/Symptoms: Modifying Factors:  Severity:  Timing: Context:   Objective  Well appearing patient in no apparent distress; mood and affect are within normal limits. Objective  Left Buccal Cheek: Possible persistence of carcinoma in situ  Objective  Mid Forehead: Roughly 70% reduction in actinic keratoses but spot on left cheek persists and she has a history of a carcinoma in situ in this area 1 to 2 years ago    A focused examination was performed including Head, neck, back.. Relevant physical exam findings are noted in the Assessment and Plan.   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  Recheck 1 to 2 months  Ordered Medications: Tirbanibulin (KLISYRI) 1 % OINT  AK (actinic keratosis) Mid Forehead      I, Lavonna Monarch, MD, have reviewed all documentation for this visit.  The documentation on 09/29/20 for the exam, diagnosis, procedures, and orders are all accurate and complete.

## 2020-11-28 ENCOUNTER — Ambulatory Visit: Payer: BC Managed Care – PPO | Admitting: Dermatology

## 2020-12-06 ENCOUNTER — Ambulatory Visit (INDEPENDENT_AMBULATORY_CARE_PROVIDER_SITE_OTHER): Payer: BC Managed Care – PPO

## 2020-12-06 ENCOUNTER — Ambulatory Visit: Payer: BC Managed Care – PPO | Admitting: Podiatry

## 2020-12-06 ENCOUNTER — Other Ambulatory Visit: Payer: Self-pay

## 2020-12-06 DIAGNOSIS — S9032XA Contusion of left foot, initial encounter: Secondary | ICD-10-CM | POA: Diagnosis not present

## 2020-12-11 NOTE — Progress Notes (Signed)
Subjective:   Patient ID: Candace Rodriguez, female   DOB: 55 y.o.   MRN: [id number]   HPI 55 year old female presents the office today for second opinion given ongoing pain in her left foot as well as swelling.  She is previously seen by another podiatrist and she had apparently a soft tissue mass and was concern for giant cell tumor of the doctor was she reports.  She had an MRI which revealed possible stress fracture.  She is immobilized for period of time in a boot.  She continues to have pain and swelling she presents today for second opinion.   Review of Systems  All other systems reviewed and are negative. 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   left [id number]   Thyroid disease    Tinnitus     Past Surgical History:  Procedure Laterality Date   CHOLECYSTECTOMY  10/2013   JOINT REPLACEMENT Bilateral 10/2008    knee each partial replacement   KNEE SURGERY     LAPAROSCOPIC ABDOMINAL EXPLORATION  06/1993   PARTIAL GASTRECTOMY  11/16/2012   VSG    WRIST SURGERY  09/1998   L     Current Outpatient Medications:    albuterol (VENTOLIN HFA) 108 (90 Base) MCG/ACT inhaler, Inhale 2 puffs into the lungs every 6 (six) hours as needed for wheezing or shortness of breath., Disp: 18 g, Rfl: 0   augmented betamethasone dipropionate (DIPROLENE) 0.05 % ointment, Apply To Affected Area After Bathing, Disp: 50 g, Rfl: 1   Calcium 200 MG TABS,  Take by mouth 2 (two) times daily. , Disp: , Rfl:    Cats Claw, Uncaria tomentosa, (CATS CLAW PO), Take 500 mg by mouth 3 (three) times daily., Disp: , Rfl:    Cholecalciferol (VITAMIN D3 PO), Take 20,000 mg by mouth daily., Disp: , Rfl:    clindamycin (CLEOCIN) 300 MG capsule, Take 2 capsules by mouth as needed. , Disp: , Rfl:    Coenzyme Q10 (COQ10) 100 MG CAPS, Take 1 capsule by mouth daily., Disp: , Rfl:    diclofenac (VOLTAREN) 75 MG EC tablet, Take 75 mg by mouth 2 (two) times daily., Disp: , Rfl:    diphenhydrAMINE HCl (BENADRYL ALLERGY PO), Take by mouth as needed., Disp: , Rfl:    levothyroxine (SYNTHROID) 25 MCG tablet, Take 25 mcg by mouth daily before breakfast., Disp: , Rfl:    Magnesium Malate POWD, 1,500 mg by Does not apply route., Disp: , Rfl:    Melatonin 10 MG TABS, Take by mouth daily., Disp: , Rfl:    Menaquinone-7 (VITAMIN K2 PO), Take 150 mcg by mouth daily., Disp: , Rfl:    Multiple Vitamin (MULTIVITAMIN) tablet, Take 1 tablet by mouth 3 (three) times daily. Celebrate Bariatric, Disp: , Rfl:    NALTREXONE HCL PO, Take  4.5 mg by mouth daily., Disp: , Rfl:    NP THYROID 60 MG tablet, Take 60 mg by mouth at bedtime., Disp: , Rfl:    OVER THE COUNTER MEDICATION, 120 mg 2 (two) times daily. Ginko Biloba, Disp: , Rfl:    OVER THE COUNTER MEDICATION, 240 mg daily. pine bark extract, Disp: , Rfl:    Probiotic Product (PROBIOTIC PO), Take by mouth daily., Disp: , Rfl:    rOPINIRole (REQUIP) 2 MG tablet, Take 2 mg by mouth at bedtime., Disp: , Rfl:    thyroid (ARMOUR) 90 MG tablet, Take 90 mg by mouth daily., Disp: , Rfl:    Tirbanibulin (KLISYRI) 1 % OINT, Apply 1 application topically at bedtime., Disp: 5 each, Rfl: 0   vitamin E 400 UNIT capsule, Take 400 Units by mouth daily. , Disp: , Rfl:   Allergies  Allergen Reactions   Avelox [Moxifloxacin]    Gluten Meal Other (See Comments)    Joint Pain   Other Other (See Comments)    Dairy intolerance   Penicillin G Itching and  Other (See Comments)    Turns red in color.   Penicillins    Quinolones Other (See Comments)    Pt can't remeber   Soy Allergy Rash    Rash in mouth.           Objective:  Physical Exam  General: AAO x3, NAD  Dermatological: There is edema present in the left forefoot there is no erythema or warmth.  There is no skin breakdown.  Vascular: Dorsalis Pedis artery and Posterior Tibial artery pedal pulses are 2/4 bilateral with immedate capillary fill time. There is no pain with calf compression, swelling, warmth, erythema.   Neruologic: Grossly intact via light touch bilateral.   Musculoskeletal: Tenderness mostly dorsal second MPJ.  There is a fluid-filled mobile soft tissue mass present to this area this is where she does get her discomfort.  Is some tenderness on the distal fourth metatarsal.  No other areas of pinpoint tenderness.  Muscular strength 5/5 in all groups tested bilateral.  Gait: Unassisted, Nonantalgic.       Assessment:   55 year old female with soft tissue mass versus stress fracture causing pain     Plan:  -Treatment options discussed including all alternatives, risks, and complications -Etiology of symptoms were discussed -X-rays were obtained and reviewed with the patient.  There is radiolucency of the metatarsal neck consistent with stress fracture.  She does have an MRI that she showed a stress fracture back in May as well.  This could be old.  I think the majority of her symptoms is coming from soft tissue mass discussed with aspiration.  I will try to see her in the [place] office next week and try to this under ultrasound guidance if able otherwise we can still try to aspirate. -Recommend stiff bottom shoe.  Trula Slade DPM

## 2020-12-14 ENCOUNTER — Other Ambulatory Visit: Payer: Self-pay

## 2020-12-14 ENCOUNTER — Ambulatory Visit: Payer: BC Managed Care – PPO | Admitting: Podiatry

## 2020-12-14 ENCOUNTER — Encounter: Payer: Self-pay | Admitting: Podiatry

## 2020-12-14 DIAGNOSIS — M7989 Other specified soft tissue disorders: Secondary | ICD-10-CM

## 2020-12-18 ENCOUNTER — Telehealth: Payer: Self-pay | Admitting: Podiatry

## 2020-12-18 ENCOUNTER — Other Ambulatory Visit: Payer: Self-pay | Admitting: Podiatry

## 2020-12-18 DIAGNOSIS — M7989 Other specified soft tissue disorders: Secondary | ICD-10-CM

## 2020-12-18 NOTE — Progress Notes (Signed)
Spoke with [facility] and they can do MSK. Will fax order attn: Cyndia Diver to [phone]

## 2020-12-18 NOTE — Telephone Encounter (Signed)
Called and spoke with the patient and the patient stated that she has an appointment on Thursday. Lattie Haw

## 2020-12-18 NOTE — Telephone Encounter (Signed)
Patient calling to inform Dr. Jacqualyn Posey that she is unable to get scheduled for MRI because the office is not taking any more appointments until further notice.

## 2020-12-19 NOTE — Progress Notes (Signed)
Subjective: 55 year old female presents the office today for aspiration of soft tissue mass on the left foot.  Overall symptoms appear to be unchanged compared to when I saw her last week  Denies any systemic complaints such as fevers, chills, nausea, vomiting. No acute changes since last appointment, and no other complaints at this time.   Objective: AAO x3, NAD DP/PT pulses palpable bilaterally, CRT less than 3 seconds On the dorsal aspect left second MPJ appears to be fluid-filled area.  There is no erythema or warmth.  Mild tenderness palpation to the area.  There is no crepitus.   No pain with calf compression, swelling, warmth, erythema  Assessment: Apparent soft tissue mass left foot  Plan: -All treatment options discussed with the patient including all alternatives, risks, complications.  -Did discuss with aspiration of the area.  Unfortunately able to do this under ultrasound.  I prepped the skin with alcohol and then a mixture of 3 cc of lidocaine plain was infiltrated in regional block fashion.  Skin was prepped with alcohol, Betadine.  I then utilized an 18-gauge needle to attempt to aspirate any fluid.  I tried this 3 times and was unsuccessful.  As I terminated trying to do any further aspiration.  Areas cleaned and a compression wrap applied.  Would order formal ultrasound as MRI was negative. -Patient encouraged to call the office with any questions, concerns, change in symptoms.   Trula Slade DPM

## 2021-01-04 ENCOUNTER — Encounter: Payer: Self-pay | Admitting: Podiatry

## 2021-02-04 ENCOUNTER — Ambulatory Visit: Payer: BC Managed Care – PPO | Admitting: Podiatry

## 2021-02-21 ENCOUNTER — Ambulatory Visit (INDEPENDENT_AMBULATORY_CARE_PROVIDER_SITE_OTHER): Payer: BC Managed Care – PPO

## 2021-02-21 ENCOUNTER — Other Ambulatory Visit: Payer: Self-pay

## 2021-02-21 ENCOUNTER — Ambulatory Visit: Payer: BC Managed Care – PPO | Admitting: Family Medicine

## 2021-02-21 ENCOUNTER — Encounter: Payer: Self-pay | Admitting: Family Medicine

## 2021-02-21 VITALS — BP 141/81 | HR 87 | Temp 97.5°F | Ht 68.0 in | Wt 233.4 lb

## 2021-02-21 DIAGNOSIS — R002 Palpitations: Secondary | ICD-10-CM | POA: Diagnosis not present

## 2021-02-21 DIAGNOSIS — R0609 Other forms of dyspnea: Secondary | ICD-10-CM

## 2021-02-21 DIAGNOSIS — I499 Cardiac arrhythmia, unspecified: Secondary | ICD-10-CM

## 2021-02-21 DIAGNOSIS — R0600 Dyspnea, unspecified: Secondary | ICD-10-CM

## 2021-02-21 NOTE — Progress Notes (Signed)
Subjective:  Patient ID: Candace Rodriguez, female    DOB: [birth year], 55 y.o.   MRN: [id number]  Patient Care Team: Janora Norlander, DO as PCP - General (Family Medicine) Lavonna Monarch, MD as Consulting Physician (Dermatology)   Chief Complaint:  Tachycardia (Fatigue, difficulty breathing)   HPI: Candace Rodriguez is a 55 y.o. female presenting on 02/21/2021 for Tachycardia (Fatigue, difficulty breathing)   Pt presents today for evaluation of palpitations. States over the last several months she has noticed some intermittent fluttering which resolved quickly. States she woke up at 0300 this morning with her heart racing 130-140 range, states lasted for several hours. She did have some slight dizziness and dyspnea with the palpitations. She denies chest pain, pallor, diaphoresis, nausea, vomiting, or syncope. No jaw, neck, back, or arm pain.  She was formerly treated for OSA but has not used a CPAP in years as she was told the OSA resolved once she lost weight. She is on thyroid repletion medications, no recent dose adjustments. She does drink at least 4 cups of coffee per day. No recent illnesses, tick bites, travels, or injuries.    Relevant past medical, surgical, family, and social history reviewed and updated as indicated.  Allergies and medications reviewed and updated. Data reviewed: Chart in Epic.   Past Medical History:  Diagnosis Date   Asthma    Basal cell carcinoma 04/13/2019   left upper back-cx3 74f   Depression    DJD (degenerative joint disease)    Fractures    Left distal radius,  left patella   History of COVID-19 07/01/2019   Hyperlipidemia    Hypertension    Lyme disease    Migraine    Obesity    [place] 09/13/2012   OSA (obstructive sleep apnea)    [facility]   Restless leg syndrome    SCC (squamous cell carcinoma) 04/13/2019   right side [id number]  Sleep apnea    Squamous cell carcinoma in situ  (SCCIS) 04/13/2019   left [id number] Thyroid disease    Tinnitus     Past Surgical History:  Procedure Laterality Date   CHOLECYSTECTOMY  10/2013   JOINT REPLACEMENT Bilateral 10/2008    knee each partial replacement   KNEE SURGERY     LAPAROSCOPIC ABDOMINAL EXPLORATION  06/1993   PARTIAL GASTRECTOMY  11/16/2012   [facility]    WRIST SURGERY  09/1998   L    Social History   Socioeconomic History   Marital status: Married    Spouse name: Not on file   Number of children: Not on file   Years of education: Not on file   Highest education level: Not on file  Occupational History   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 [employer]   Smoking status: Former    Packs/day: 2.00    Years: 10.00    Pack years: 20.00    Types: Cigarettes    Quit date: 05/26/1998    Years since quitting: 22.7   Smokeless tobacco: Never  Vaping Use   Vaping Use: Never used  Substance and Sexual Activity   Alcohol use: No    Alcohol/week: 0.0 standard drinks   Drug use: No   Sexual activity: Not on file  Other Topics Concern   Not on file  Social History Narrative   Not on file   Social Determinants of Health   Financial Resource Strain: Not on file  Food Insecurity: Not on file  Transportation Needs: Not on file  Physical Activity: Not on file  Stress: Not on file  Social Connections: Not on file  Intimate Partner Violence: Not on file    Outpatient Encounter Medications as of 02/21/2021  Medication Sig   albuterol (VENTOLIN HFA) 108 (90 Base) MCG/ACT inhaler Inhale 2 puffs into the lungs every 6 (six) hours as needed for wheezing or shortness of breath.   augmented betamethasone dipropionate (DIPROLENE) 0.05 % ointment Apply To Affected Area After Bathing   Calcium 200 MG TABS Take by mouth 2 (two) times daily.    Cats Claw, Uncaria tomentosa, (CATS CLAW PO) Take 500 mg by mouth 3 (three) times daily.   Cholecalciferol (VITAMIN D3 PO) Take 20,000 mg by mouth daily.   clindamycin  (CLEOCIN) 300 MG capsule Take 2 capsules by mouth as needed.    Coenzyme Q10 (COQ10) 100 MG CAPS Take 1 capsule by mouth daily.   diclofenac (VOLTAREN) 75 MG EC tablet Take 75 mg by mouth 2 (two) times daily.   diphenhydrAMINE HCl (BENADRYL ALLERGY PO) Take by mouth as needed.   levothyroxine (SYNTHROID) 25 MCG tablet Take 25 mcg by mouth daily before breakfast.   Magnesium Malate POWD 1,500 mg by Does not apply route.   Melatonin 10 MG TABS Take by mouth daily.   Menaquinone-7 (VITAMIN K2 PO) Take 150 mcg by mouth daily.   Multiple Vitamin (MULTIVITAMIN) tablet Take 1 tablet by mouth 3 (three) times daily. Celebrate Bariatric   NP THYROID 60 MG tablet Take 60 mg by mouth at bedtime.   OVER THE COUNTER MEDICATION 120 mg 2 (two) times daily. Ginko Biloba   OVER THE COUNTER MEDICATION 240 mg daily. pine bark extract   Probiotic Product (PROBIOTIC PO) Take by mouth daily.   rOPINIRole (REQUIP) 2 MG tablet Take 2 mg by mouth at bedtime.   thyroid (ARMOUR) 90 MG tablet Take 90 mg by mouth daily.   vitamin E 400 UNIT capsule Take 400 Units by mouth daily.    [DISCONTINUED] NALTREXONE HCL PO Take 4.5 mg by mouth daily.   [DISCONTINUED] Tirbanibulin (KLISYRI) 1 % OINT Apply 1 application topically at bedtime.   No facility-administered encounter medications on file as of 02/21/2021.    Allergies  Allergen Reactions   Avelox [Moxifloxacin]    Gluten Meal Other (See Comments)    Joint Pain   Other Other (See Comments)    Dairy intolerance   Penicillin G Itching and Other (See Comments)    Turns red in color.   Penicillins    Quinolones Other (See Comments)    Pt can't remeber   Soy Allergy Rash    Rash in mouth.    Review of Systems  Constitutional:  Positive for fatigue. Negative for activity change, appetite change, chills, diaphoresis, fever and unexpected weight change.  HENT: Negative.    Eyes: Negative.   Respiratory:  Positive for shortness of breath. Negative for apnea, cough,  choking, chest tightness, wheezing and stridor.   Cardiovascular:  Positive for palpitations. Negative for chest pain and leg swelling.  Gastrointestinal:  Negative for abdominal pain, blood in stool, constipation, diarrhea, nausea and vomiting.  Endocrine: Negative.   Genitourinary:  Negative for decreased urine volume, difficulty urinating, dysuria, frequency and urgency.  Musculoskeletal:  Negative for arthralgias and myalgias.  Skin: Negative.   Allergic/Immunologic: Negative.   Neurological:  Positive for dizziness and light-headedness. Negative for tremors, seizures, syncope, facial asymmetry, speech difficulty, weakness, numbness  and headaches.  Hematological: Negative.   Psychiatric/Behavioral:  Negative for confusion, hallucinations, sleep disturbance and suicidal ideas.   All other systems reviewed and are negative.      Objective:  BP (!) 141/81   Pulse 87   Temp (!) 97.5 F (36.4 C)   Ht 5' 8" (1.727 m)   Wt 233 lb 6.4 oz (105.9 kg)   SpO2 99%   BMI 35.49 kg/m    Wt Readings from Last 3 Encounters:  02/21/21 233 lb 6.4 oz (105.9 kg)  09/19/20 256 lb 6.4 oz (116.3 kg)  08/16/19 242 lb 9.6 oz (110 kg)    Physical Exam Vitals and nursing note reviewed.  Constitutional:      General: She is not in acute distress.    Appearance: Normal appearance. She is well-developed and well-groomed. She is obese. She is not ill-appearing, toxic-appearing or diaphoretic.  HENT:     Head: Normocephalic and atraumatic.     Jaw: There is normal jaw occlusion.     Right Ear: Hearing normal.     Left Ear: Hearing normal.     Nose: Nose normal.     Mouth/Throat:     Lips: Pink.     Mouth: Mucous membranes are moist.     Pharynx: Oropharynx is clear. Uvula midline.  Eyes:     General: Lids are normal.     Extraocular Movements: Extraocular movements intact.     Conjunctiva/sclera: Conjunctivae normal.     Pupils: Pupils are equal, round, and reactive to light.  Neck:      Thyroid: No thyroid mass, thyromegaly or thyroid tenderness.     Vascular: No carotid bruit or JVD.     Trachea: Trachea and phonation normal.  Cardiovascular:     Rate and Rhythm: Normal rate and regular rhythm.     Chest Wall: PMI is not displaced.     Pulses: Normal pulses.     Heart sounds: Normal heart sounds. No murmur heard.   No friction rub. No gallop.  Pulmonary:     Effort: Pulmonary effort is normal. No respiratory distress.     Breath sounds: Normal breath sounds. No wheezing.  Abdominal:     General: Bowel sounds are normal. There is no distension or abdominal bruit.     Palpations: Abdomen is soft. There is no hepatomegaly or splenomegaly.     Tenderness: There is no abdominal tenderness. There is no right CVA tenderness or left CVA tenderness.     Hernia: No hernia is present.  Musculoskeletal:        General: Normal range of motion.     Cervical back: Normal range of motion and neck supple.     Right lower leg: No edema.     Left lower leg: No edema.  Lymphadenopathy:     Cervical: No cervical adenopathy.  Skin:    General: Skin is warm and dry.     Capillary Refill: Capillary refill takes less than 2 seconds.     Coloration: Skin is not cyanotic, jaundiced or pale.     Findings: No rash.  Neurological:     General: No focal deficit present.     Mental Status: She is alert and oriented to person, place, and time.     Cranial Nerves: Cranial nerves are intact.     Sensory: Sensation is intact.     Motor: Motor function is intact.     Coordination: Coordination is intact.     Gait: Gait  is intact. Gait normal.     Deep Tendon Reflexes: Reflexes are normal and symmetric.  Psychiatric:        Attention and Perception: Attention and perception normal.        Mood and Affect: Mood and affect normal.        Speech: Speech normal.        Behavior: Behavior normal. Behavior is cooperative.        Thought Content: Thought content normal.        Cognition and Memory:  Cognition and memory normal.        Judgment: Judgment normal.    Results for orders placed or performed in visit on 03/27/20  Novel Coronavirus, NAA ([facility])   Specimen: Nasopharyngeal(NP) swabs in vial transport medium  Result Value Ref Range   SARS-CoV-2, NAA Not Detected Not Detected  SARS-COV-2, NAA 2 DAY TAT  Result Value Ref Range   SARS-CoV-2, NAA 2 DAY TAT Performed     EKG in office: SR 78, PR 154 ms, QT 376, no acute ST-T changes, no ectopy, no significant changes from prior EKG. Michelle Rakes,FNP-C X-Ray: CXR: No acute findings. Preliminary x-ray reading by Monia Pouch, FNP-C, [facility].   Pertinent labs & imaging results that were available during my care of the patient were reviewed by me and considered in my medical decision making.  Assessment & Plan:  Candace Rodriguez was seen today for tachycardia.  Diagnoses and all orders for this visit:  Irregular heart rate Palpitations Dyspnea on exertion EKG and chest xray without acute findings. Labs pending. Needs event monitor placed so referral to cardiology placed. Pt aware she may require new sleep study as this could be due to untreated sleep apnea. Will adjust thyroid repletion therapy if warranted. Baby ASA advised. Avoid excessive caffeine and stimulant use. Pt aware if tachycardia does not resolve, she needs to be evaluated and treated.  -     CMP14+EGFR -     CBC with Differential/Platelet -     Thyroid Panel With TSH -     Lipid panel -     Ambulatory referral to Cardiology -     DG Chest 2 View; Future    Continue all other maintenance medications.  Follow up plan: Return in about 8 weeks (around 04/18/2021), or if symptoms worsen or fail to improve, for palpitations.   Continue healthy lifestyle choices, including diet (rich in fruits, vegetables, and lean proteins, and low in salt and simple carbohydrates) and exercise (at least 30 minutes of moderate physical activity daily).  Educational handout given for  palpitations  The above assessment and management plan was discussed with the patient. The patient verbalized understanding of and has agreed to the management plan. Patient is aware to call the clinic if they develop any new symptoms or if symptoms persist or worsen. Patient is aware when to return to the clinic for a follow-up visit. Patient educated on when it is appropriate to go to the emergency department.   Monia Pouch, FNP-C [facility] [phone]

## 2021-02-22 ENCOUNTER — Encounter: Payer: Self-pay | Admitting: Family Medicine

## 2021-02-22 LAB — CBC WITH DIFFERENTIAL/PLATELET
Basophils Absolute: 0 10*3/uL (ref 0.0–0.2)
Basos: 1 %
EOS (ABSOLUTE): 0.2 10*3/uL (ref 0.0–0.4)
Eos: 3 %
Hematocrit: 45 % (ref 34.0–46.6)
Hemoglobin: 15.4 g/dL (ref 11.1–15.9)
Immature Grans (Abs): 0 10*3/uL (ref 0.0–0.1)
Immature Granulocytes: 0 %
Lymphocytes Absolute: 2.1 10*3/uL (ref 0.7–3.1)
Lymphs: 35 %
MCH: 26.3 pg — ABNORMAL LOW (ref 26.6–33.0)
MCHC: 34.2 g/dL (ref 31.5–35.7)
MCV: 77 fL — ABNORMAL LOW (ref 79–97)
Monocytes Absolute: 0.5 10*3/uL (ref 0.1–0.9)
Monocytes: 8 %
Neutrophils Absolute: 3.2 10*3/uL (ref 1.4–7.0)
Neutrophils: 53 %
Platelets: 238 10*3/uL (ref 150–450)
RBC: 5.86 x10E6/uL — ABNORMAL HIGH (ref 3.77–5.28)
RDW: 14.9 % (ref 11.7–15.4)
WBC: 6.1 10*3/uL (ref 3.4–10.8)

## 2021-02-22 LAB — CMP14+EGFR
ALT: 21 IU/L (ref 0–32)
AST: 18 IU/L (ref 0–40)
Albumin/Globulin Ratio: 2.1 (ref 1.2–2.2)
Albumin: 4.4 g/dL (ref 3.8–4.9)
Alkaline Phosphatase: 80 IU/L (ref 44–121)
BUN/Creatinine Ratio: 22 (ref 9–23)
BUN: 13 mg/dL (ref 6–24)
Bilirubin Total: 0.7 mg/dL (ref 0.0–1.2)
CO2: 25 mmol/L (ref 20–29)
Calcium: 9.6 mg/dL (ref 8.7–10.2)
Chloride: 104 mmol/L (ref 96–106)
Creatinine, Ser: 0.59 mg/dL (ref 0.57–1.00)
Globulin, Total: 2.1 g/dL (ref 1.5–4.5)
Glucose: 80 mg/dL (ref 70–99)
Potassium: 4 mmol/L (ref 3.5–5.2)
Sodium: 143 mmol/L (ref 134–144)
Total Protein: 6.5 g/dL (ref 6.0–8.5)
eGFR: 106 mL/min/{1.73_m2} (ref >59)

## 2021-02-22 LAB — LIPID PANEL
Chol/HDL Ratio: 3.7 ratio (ref 0.0–4.4)
Cholesterol, Total: 224 mg/dL — ABNORMAL HIGH (ref 100–199)
HDL: 61 mg/dL (ref >39)
LDL Chol Calc (NIH): 147 mg/dL — ABNORMAL HIGH (ref 0–99)
Triglycerides: 89 mg/dL (ref 0–149)
VLDL Cholesterol Cal: 16 mg/dL (ref 5–40)

## 2021-02-22 LAB — THYROID PANEL WITH TSH
Free Thyroxine Index: 1.7 (ref 1.2–4.9)
T3 Uptake Ratio: 26 % (ref 24–39)
T4, Total: 6.7 ug/dL (ref 4.5–12.0)
TSH: 0.053 u[IU]/mL — ABNORMAL LOW (ref 0.450–4.500)

## 2021-02-27 ENCOUNTER — Ambulatory Visit: Payer: BC Managed Care – PPO | Admitting: Cardiology

## 2021-02-27 ENCOUNTER — Other Ambulatory Visit: Payer: Self-pay

## 2021-02-27 ENCOUNTER — Encounter: Payer: Self-pay | Admitting: Cardiology

## 2021-02-27 VITALS — BP 142/88 | HR 82 | Ht 68.0 in | Wt 239.0 lb

## 2021-02-27 DIAGNOSIS — R002 Palpitations: Secondary | ICD-10-CM

## 2021-02-27 DIAGNOSIS — I10 Essential (primary) hypertension: Secondary | ICD-10-CM

## 2021-02-27 DIAGNOSIS — R0602 Shortness of breath: Secondary | ICD-10-CM | POA: Diagnosis not present

## 2021-02-27 DIAGNOSIS — G4733 Obstructive sleep apnea (adult) (pediatric): Secondary | ICD-10-CM

## 2021-02-27 NOTE — Progress Notes (Signed)
Cardiology CONSULT Note    Date:  02/27/2021   ID:  Candace Rodriguez, DOB 1965-12-23, MRN [id number]  PCP:  Janora Norlander, DO  Cardiologist:  Fransico Him, MD   Chief Complaint  Patient presents with   Sleep Apnea   Hypertension   Palpitations   Shortness of Breath    History of Present Illness:  Candace Rodriguez is a 55 y.o. female who is being seen today for the evaluation of tachycardia and fatigue with SOB at the request of Rakes, Connye Burkitt, FNP.  This is a 55yo female with a hx of asthma, depression, HTN, HLD, morbid obesity, RLS, OSA who is referred for evaluation of tachycardia. She was recently seen by her PCP and complained of a several month hx of palpitations that she describes as a fluttering sensation in her chest.  Her heart rate will get as fast as 130-140bpm and last several hours. The other night she had an episode that awakened her from sleep with her HR in the 130's.  This was associated with dizziness and SOB.  The next day she felt fatigued but went to the gym and worked out but noticed she was SOB some. She occasionally has some dizziness if she has not eaten before going to the gym.   She occasionally has some mild LE edema.  She has an extensive fm hx of atrial fibrillation.  She also has a fm hx of SCD.  She had a Maternal uncle (age 27's) and first cousin (age 43's) who had SCD.   She has a hx of OSA dx in 2009 and started on CPAP but has not been on CPAP in years.  She had weight loss surgery and lost down to 180lbs and then gained back weight but has lost 40lbs again since then beginning of the year. Her last sleep study was in 2016 that showed no OSA.  Labs in PCP office last month were normal including CMET, CBC and thyroid.  She does not drink any ETOH and only drinks 30oz of coffee daily and has cut that down to 2 cups in the am.    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   augmented betamethasone dipropionate (DIPROLENE) 0.05 % ointment Apply To Affected Area After Bathing   Calcium 200 MG TABS Take by mouth 2 (two) times daily.    Cats Claw, Uncaria tomentosa, (CATS CLAW PO) Take 500 mg by mouth 3 (three) times daily.   Cholecalciferol (VITAMIN D3 PO) Take 20,000 mg by mouth daily.   clindamycin (CLEOCIN) 300 MG capsule Take 2 capsules by mouth as needed.    Coenzyme Q10 (COQ10) 100 MG CAPS Take 1 capsule by mouth daily.   diclofenac (VOLTAREN) 75 MG EC tablet Take 75 mg by mouth 2 (two) times daily.   diphenhydrAMINE HCl (BENADRYL ALLERGY PO) Take by mouth as needed.   levothyroxine (SYNTHROID) 25 MCG tablet Take 25 mcg by mouth daily before breakfast.   Magnesium Malate POWD 1,500 mg by Does not apply route.   Melatonin 10 MG TABS Take by mouth daily.   Menaquinone-7 (VITAMIN K2 PO) Take 150 mcg by mouth daily.   Multiple Vitamin  (MULTIVITAMIN) tablet Take 1 tablet by mouth 3 (three) times daily. Celebrate Bariatric   NP THYROID 60 MG tablet Take 60 mg by mouth at bedtime.   OVER THE COUNTER MEDICATION 120 mg 2 (two) times daily. Ginko Biloba   OVER THE COUNTER MEDICATION 240 mg daily. pine bark extract   Probiotic Product (PROBIOTIC PO) Take by mouth daily.   rOPINIRole (REQUIP) 2 MG tablet Take 2 mg by mouth at bedtime.   thyroid (ARMOUR) 90 MG tablet Take 90 mg by mouth daily.   vitamin E 400 UNIT capsule Take 400 Units by mouth daily.     Allergies:   Avelox [moxifloxacin], Gluten meal, Other, Penicillin g, Penicillins, Quinolones, and Soy allergy   Social History   Socioeconomic History   Marital status: Married    Spouse name: Not on file   Number of children: Not on file   Years of education: Not on file   Highest education level: Not on file  Occupational History   Occupation: Theme park manager and Land  Tobacco Use   Smoking status: Former    Packs/day: 2.00    Years: 10.00    Pack years: 20.00    Types: Cigarettes    Quit date: 05/26/1998    Years since quitting: 22.7   Smokeless tobacco: Never  Vaping Use   Vaping Use: Never used  Substance and Sexual Activity   Alcohol use: No    Alcohol/week: 0.0 standard drinks   Drug use: No   Sexual activity: Not on file  Other Topics Concern   Not on file  Social History Narrative   Not on file   Social Determinants of Health   Financial Resource Strain: Not on file  Food Insecurity: Not on file  Transportation Needs: Not on file  Physical Activity: Not on file  Stress: Not on file  Social Connections: Not on file     Family History:  The patient's family history includes Arthritis in her father and mother; Atrial fibrillation in her brother; Cancer in her maternal grandfather, maternal grandmother, and paternal grandmother; Heart disease in her maternal grandmother and paternal grandfather; Hypertension in her father.   ROS:    Please see the history of present illness.    ROS All other systems reviewed and are negative.  No flowsheet data found.  PHYSICAL EXAM:   VS:  BP (!) 142/88 (BP Location: Right Arm, Patient Position: Sitting, Cuff Size: Large)   Pulse  82   Ht 5\' 8"  (1.727 m)   Wt 239 lb (108.4 kg)   SpO2 98%   BMI 36.34 kg/m    GEN: Well nourished, well developed, in no acute distress  HEENT: normal  Neck: no JVD, carotid bruits, or masses Cardiac: RRR; no murmurs, rubs, or gallops,no edema.  Intact distal pulses bilaterally.  Respiratory:  clear to auscultation bilaterally, normal work of breathing GI: soft, nontender, nondistended, + BS MS: no deformity or atrophy  Skin: warm and dry, no rash Neuro:  Alert and Oriented x 3, Strength and sensation are intact Psych: euthymic mood, full affect  Wt Readings from Last 3 Encounters:  02/27/21 239 lb (108.4 kg)  02/21/21 233 lb 6.4 oz (105.9 kg)  09/19/20 256 lb 6.4 oz (116.3 kg)      Studies/Labs Reviewed:   EKG:  EKG in Aug 2022 showed NSR with LAFB  Recent Labs: 02/21/2021: ALT 21; BUN 13; Creatinine, Ser 0.59; Hemoglobin 15.4; Platelets 238; Potassium 4.0; Sodium 143; TSH 0.053   Lipid Panel    Component Value Date/Time   CHOL 224 (H) 02/21/2021 1216   TRIG 89 02/21/2021 1216   TRIG 79 11/07/2013 0906   HDL 61 02/21/2021 1216   HDL 51 11/07/2013 0906   CHOLHDL 3.7 02/21/2021 1216   LDLCALC 147 (H) 02/21/2021 1216   LDLCALC 110 (H) 11/07/2013 0906    Additional studies/ records that were reviewed today include:  OV notes and labs from PCP    ASSESSMENT:    1. Palpitations   2. SOB (shortness of breath)   3. OSA (obstructive sleep apnea)   4. Essential hypertension, benign      PLAN:  In order of problems listed above:  Palpitations -these can last up to 30 minutes at a time and associated with mild SOB and dizziness -I will get a 30 day event monitor  -encouraged her to avoid caffeine  -CBC and TSH were  normal  2.  SOB -this occurs mainly with palpitations -check 2D echo to assess LVF -TSH was normal  3.  OSA -she was dx with OSA years ago and has not been on CPAP in some time -her last sleep study in 2016 showed no OSA after weight loss  4. HTN -BP controlled on exam today -diet controlled   Time Spent: 25 minutes total time of encounter, including 15 minutes spent in face-to-face patient care on the date of this encounter. This time includes coordination of care and counseling regarding above mentioned problem list. Remainder of non-face-to-face time involved reviewing chart documents/testing relevant to the patient encounter and documentation in the medical record. I have independently reviewed documentation from referring provider  Medication Adjustments/Labs and Tests Ordered: Current medicines are reviewed at length with the patient today.  Concerns regarding medicines are outlined above.  Medication changes, Labs and Tests ordered today are listed in the Patient Instructions below.  There are no Patient Instructions on file for this visit.   Signed, Fransico Him, MD  02/27/2021 3:11 PM    [facility] [address], [place], [place]  [address] Phone: [phone]; Fax: [phone]

## 2021-02-27 NOTE — Addendum Note (Signed)
Addended by: Antonieta Iba on: 02/27/2021 03:19 PM   Modules accepted: Orders

## 2021-02-27 NOTE — Patient Instructions (Signed)
Medication Instructions:  Your physician recommends that you continue on your current medications as directed. Please refer to the Current Medication list given to you today.  *If you need a refill on your cardiac medications before your next appointment, please call your pharmacy*   Testing/Procedures: Your physician has requested that you have an echocardiogram. Echocardiography is a painless test that uses sound waves to create images of your heart. It provides your doctor with information about the size and shape of your heart and how well your heart's chambers and valves are working. This procedure takes approximately one hour. There are no restrictions for this procedure.  Your physician has recommended that you wear an event monitor. Event monitors are medical devices that record the heart's electrical activity. Doctors most often [place] these monitors to diagnose arrhythmias. Arrhythmias are problems with the speed or rhythm of the heartbeat. The monitor is a small, portable device. You can wear one while you do your normal daily activities. This is usually used to diagnose what is causing palpitations/syncope (passing out).  Follow-Up: At [facility], you and your health needs are our priority.  As part of our continuing mission to provide you with exceptional heart care, we have created designated Provider Care Teams.  These Care Teams include your primary Cardiologist (physician) and Advanced Practice Providers (APPs -  Physician Assistants and Nurse Practitioners) who all work together to provide you with the care you need, when you need it.  Follow up with Dr. Radford Pax as needed based on results of testing.    Other Instructions [employer] Cardiac Event Monitor Instructions Your physician has requested you wear your cardiac event monitor for _30_ days, (1-30). [employer] may call or text to confirm a shipping address. The monitor will be sent to a land address via UPS. [employer] will  not ship a monitor to a PO BOX. It typically takes 3-5 days to receive your monitor after it has been enrolled. [employer] will assist with USPS tracking if your package is delayed. The telephone number for [employer] is [phone]. Once you have received your monitor, please review the enclosed instructions. Instruction tutorials can also be viewed under help and settings on the enclosed cell phone. Your monitor has already been registered assigning a specific monitor serial # to you.  Applying the monitor Remove cell phone from case and turn it on. The cell phone works as Dealer and needs to be within [employer] of you at all times. The cell phone will need to be charged on a daily basis. We recommend you plug the cell phone into the enclosed charger at your bedside table every night.  Monitor batteries: You will receive two monitor batteries labelled #1 and #2. These are your recorders. Plug battery #2 onto the second connection on the enclosed charger. Keep one battery on the charger at all times. This will keep the monitor battery deactivated. It will also keep it fully charged for when you need to switch your monitor batteries. A small light will be blinking on the battery emblem when it is charging. The light on the battery emblem will remain on when the battery is fully charged.  Open package of a Monitor strip. Insert battery #1 into black hood on strip and gently squeeze monitor battery onto connection as indicated in instruction booklet. Set aside while preparing skin.  Choose location for your strip, vertical or horizontal, as indicated in the instruction booklet. Shave to remove all hair from location. There cannot be any  lotions, oils, powders, or colognes on skin where monitor is to be applied. Wipe skin clean with enclosed Saline wipe. Dry skin completely.  Peel paper labeled #1 off the back of the Monitor strip exposing the adhesive. Place the monitor on the chest  in the vertical or horizontal position shown in the instruction booklet. One arrow on the monitor strip must be pointing upward. Carefully remove paper labeled #2, attaching remainder of strip to your skin. Try not to create any folds or wrinkles in the strip as you apply it.  Firmly press and release the circle in the center of the monitor battery. You will hear a small beep. This is turning the monitor battery on. The heart emblem on the monitor battery will light up every 5 seconds if the monitor battery in turned on and connected to the patient securely. Do not push and hold the circle down as this turns the monitor battery off. The cell phone will locate the monitor battery. A screen will appear on the cell phone checking the connection of your monitor strip. This may read poor connection initially but change to good connection within the next minute. Once your monitor accepts the connection you will hear a series of 3 beeps followed by a climbing crescendo of beeps. A screen will appear on the cell phone showing the two monitor strip placement options. Touch the picture that demonstrates where you applied the monitor strip.  Your monitor strip and battery are waterproof. You are able to shower, bathe, or swim with the monitor on. They just ask you do not submerge deeper than 3 feet underwater. We recommend removing the monitor if you are swimming in a lake, river, or ocean.  Your monitor battery will need to be switched to a fully charged monitor battery approximately once a week. The cell phone will alert you of an action which needs to be made.  On the cell phone, tap for details to reveal connection status, monitor battery status, and cell phone battery status. The green dots indicates your monitor is in good status. A red dot indicates there is something that needs your attention.  To record a symptom, click the circle on the monitor battery. In 30-60 seconds a list of symptoms  will appear on the cell phone. Select your symptom and tap save. Your monitor will record a sustained or significant arrhythmia regardless of you clicking the button. Some patients do not feel the heart rhythm irregularities. [employer] will notify us of any serious or critical events.  Refer to instruction booklet for instructions on switching batteries, changing strips, the Do not disturb or Pause features, or any additional questions.  Call [employer] at [phone], to confirm your monitor is transmitting and record your baseline. They will answer any questions you may have regarding the monitor instructions at that time.  Returning the monitor to [place] all equipment back into blue box. Peel off strip of paper to expose adhesive and close box securely. There is a prepaid UPS shipping label on this box. Drop in a UPS drop box, or at a UPS facility like [employer]. You may also contact [employer] to arrange UPS to pick up monitor package at your home.

## 2021-03-05 ENCOUNTER — Ambulatory Visit (INDEPENDENT_AMBULATORY_CARE_PROVIDER_SITE_OTHER): Payer: BC Managed Care – PPO

## 2021-03-05 DIAGNOSIS — R002 Palpitations: Secondary | ICD-10-CM | POA: Diagnosis not present

## 2021-03-11 ENCOUNTER — Other Ambulatory Visit: Payer: Self-pay

## 2021-04-01 ENCOUNTER — Other Ambulatory Visit: Payer: Self-pay

## 2021-04-01 ENCOUNTER — Ambulatory Visit (HOSPITAL_COMMUNITY)
Admission: RE | Admit: 2021-04-01 | Discharge: 2021-04-01 | Disposition: A | Discharge status: Home / Self Care | Payer: BC Managed Care – PPO | Source: Ambulatory Visit | Attending: Cardiology | Admitting: Cardiology

## 2021-04-01 DIAGNOSIS — R0602 Shortness of breath: Secondary | ICD-10-CM | POA: Diagnosis present

## 2021-04-01 NOTE — Progress Notes (Signed)
*  PRELIMINARY RESULTS* Echocardiogram 2D Echocardiogram has been performed.  Elpidio Anis 04/01/2021, 3:55 PM

## 2021-04-02 LAB — ECHOCARDIOGRAM COMPLETE
AR max vel: 2.51 cm2
AV Area VTI: 2.23 cm2
AV Area mean vel: 2.07 cm2
AV Mean grad: 6 mmHg
AV Peak grad: 11.3 mmHg
Ao pk vel: 1.68 m/s
Area-P 1/2: 4.65 cm2
Calc EF: 56.7 %
MV VTI: 3.42 cm2
S' Lateral: 3 cm
Single Plane A2C EF: 48 %
Single Plane A4C EF: 61.7 %

## 2021-04-08 ENCOUNTER — Telehealth: Payer: Self-pay

## 2021-04-08 MED ORDER — METOPROLOL SUCCINATE ER 25 MG PO TB24: 25.0000 mg | ORAL_TABLET | Freq: Every day | ORAL | 3 refills | Status: DC

## 2021-04-08 NOTE — Telephone Encounter (Signed)
Per Ashok Norris, MD: Heart monitor showed normal rhythm with several episodes of nonsustained atrial tachycardia - start Toprol XL 25mg  daily with followup visit in 4 weeks on televisit      Pt notified and verbalized understanding. Will send Dr. Theodosia Blender nurse a secure message to have appt scheduled as there is no available appts at [facility]

## 2021-04-16 ENCOUNTER — Encounter: Payer: Self-pay | Admitting: Family Medicine

## 2021-04-16 ENCOUNTER — Ambulatory Visit: Payer: BC Managed Care – PPO | Admitting: Family Medicine

## 2021-04-16 ENCOUNTER — Other Ambulatory Visit: Payer: Self-pay

## 2021-04-16 VITALS — BP 147/80 | HR 74 | Temp 97.8°F | Ht 68.0 in | Wt 241.0 lb

## 2021-04-16 DIAGNOSIS — G2581 Restless legs syndrome: Secondary | ICD-10-CM

## 2021-04-16 DIAGNOSIS — R002 Palpitations: Secondary | ICD-10-CM | POA: Diagnosis not present

## 2021-04-16 DIAGNOSIS — E611 Iron deficiency: Secondary | ICD-10-CM

## 2021-04-16 DIAGNOSIS — E039 Hypothyroidism, unspecified: Secondary | ICD-10-CM | POA: Diagnosis not present

## 2021-04-16 DIAGNOSIS — J4520 Mild intermittent asthma, uncomplicated: Secondary | ICD-10-CM

## 2021-04-16 MED ORDER — ALBUTEROL SULFATE HFA 108 (90 BASE) MCG/ACT IN AERS: 2.0000 | INHALATION_SPRAY | Freq: Four times a day (QID) | RESPIRATORY_TRACT | 11 refills | Status: DC | PRN

## 2021-04-16 MED ORDER — CARBIDOPA-LEVODOPA 10-100 MG PO TABS: 1.0000 | ORAL_TABLET | Freq: Three times a day (TID) | ORAL | 0 refills | Status: DC | PRN

## 2021-04-16 NOTE — Progress Notes (Signed)
Subjective:  Patient ID: Candace Rodriguez, female    DOB: [birth year], 55 y.o.   MRN: [id number]  Patient Care Team: Janora Norlander, DO as PCP - General (Family Medicine) Lavonna Monarch, MD as Consulting Physician (Dermatology)   Chief Complaint:  8 week f/u (Palpitations/Restless leg syndrome/Medication management)   HPI: Candace Rodriguez is a 55 y.o. female presenting on 04/16/2021 for 8 week f/u (Palpitations/Restless leg syndrome/Medication management)   Pt presents today for follow up on palpitations. She was seen in office for palpitations. Labs were completed and she was referred to cardiology. Echo was unremarkable. Event monitor revealed PAC's, no A-Fib or A-Flutter. She was started on BB therapy and has done well. States minimal to no palpitations. Her TSH was elevated at prior visit but T3 and T4 were normal. She states she still has fatigue, malaise, dry skin, hair loss, and cold intolerance. She is on NP thyroid 90 mg in the AM, 60 mg in the PM and levothyroxine 25 mcg every other day. She would like for her regimen to be simplified. Her thyroid has not been managed in this office but she would like for it to be now.  She reports her RLS is worsening and the Requip will make her very tired if she takes this during the day for symptoms relief.  She has mild intermittent asthma which is well controlled with albuterol PRN. She does report increased use of Albuterol since COVID-19 illness.    Relevant past medical, surgical, family, and social history reviewed and updated as indicated.  Allergies and medications reviewed and updated. Data reviewed: Chart in Epic.   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   left cheek-[id number]   Thyroid disease    Tinnitus     Past Surgical History:  Procedure Laterality Date   CHOLECYSTECTOMY  10/2013   JOINT REPLACEMENT Bilateral 10/2008    knee each partial replacement   KNEE SURGERY     LAPAROSCOPIC ABDOMINAL EXPLORATION  06/1993   PARTIAL GASTRECTOMY  11/16/2012   VSG    WRIST SURGERY  09/1998   L    Social History   Socioeconomic History   Marital status: Married    Spouse name: Not on file   Number of children: Not on file   Years of education: Not on file   Highest education level: Not on file  Occupational History   Occupation: Theme park manager and Land  Tobacco Use   Smoking status: Former    Packs/day: 2.00    Years: 10.00    Pack years: 20.00    Types: Cigarettes    Quit date: 05/26/1998    Years since quitting: 22.9   Smokeless tobacco: Never  Vaping Use   Vaping Use: Never used  Substance and Sexual Activity  Alcohol use: No    Alcohol/week: 0.0 standard drinks   Drug use: No   Sexual activity: Not on file  Other Topics Concern   Not on file  Social History Narrative   Not on file   Social Determinants of Health   Financial Resource Strain: Not on file  Food Insecurity: Not on file  Transportation Needs: Not on file  Physical Activity: Not on file  Stress: Not on file  Social Connections: Not on file  Intimate Partner Violence: Not on file    Outpatient Encounter Medications as of 04/16/2021  Medication Sig   augmented betamethasone dipropionate (DIPROLENE) 0.05 % ointment Apply To Affected Area After Bathing   Calcium 200 MG TABS Take by mouth 2 (two) times daily.    carbidopa-levodopa (SINEMET IR) 10-100 MG tablet  Take 1 tablet by mouth 3 (three) times daily as needed.   Cats Claw, Uncaria tomentosa, (CATS CLAW PO) Take 500 mg by mouth 3 (three) times daily.   Cholecalciferol (VITAMIN D3 PO) Take 20,000 mg by mouth daily.   clindamycin (CLEOCIN) 300 MG capsule Take 2 capsules by mouth as needed.    Coenzyme Q10 (COQ10) 100 MG CAPS Take 1 capsule by mouth daily.   diclofenac (VOLTAREN) 75 MG EC tablet Take 75 mg by mouth 2 (two) times daily.   diphenhydrAMINE HCl (BENADRYL ALLERGY PO) Take by mouth as needed.   levothyroxine (SYNTHROID) 25 MCG tablet Take 25 mcg by mouth daily before breakfast.   Magnesium Malate POWD 1,500 mg by Does not apply route.   Melatonin 10 MG TABS Take by mouth daily.   Menaquinone-7 (VITAMIN K2 PO) Take 150 mcg by mouth daily.   metoprolol succinate (TOPROL XL) 25 MG 24 hr tablet Take 1 tablet (25 mg total) by mouth daily.   Multiple Vitamin (MULTIVITAMIN) tablet Take 1 tablet by mouth 3 (three) times daily. Celebrate Bariatric   NP THYROID 60 MG tablet Take 60 mg by mouth at bedtime.   OVER THE COUNTER MEDICATION 120 mg 2 (two) times daily. Ginko Biloba   OVER THE COUNTER MEDICATION 240 mg daily. pine bark extract   Probiotic Product (PROBIOTIC PO) Take by mouth daily.   rOPINIRole (REQUIP) 2 MG tablet Take 2 mg by mouth at bedtime.   thyroid (ARMOUR) 90 MG tablet Take 90 mg by mouth daily.   vitamin E 400 UNIT capsule Take 400 Units by mouth daily.    [DISCONTINUED] albuterol (VENTOLIN HFA) 108 (90 Base) MCG/ACT inhaler Inhale 2 puffs into the lungs every 6 (six) hours as needed for wheezing or shortness of breath.   albuterol (VENTOLIN HFA) 108 (90 Base) MCG/ACT inhaler Inhale 2 puffs into the lungs every 6 (six) hours as needed for wheezing or shortness of breath.   No facility-administered encounter medications on file as of 04/16/2021.    Allergies  Allergen Reactions   Avelox [Moxifloxacin]    Gluten Meal Other (See Comments)    Joint Pain   Other Other (See  Comments)    Dairy intolerance   Penicillin G Itching and Other (See Comments)    Turns red in color.   Penicillins    Quinolones Other (See Comments)    Pt can't remeber   Soy Allergy Rash    Rash in mouth.    Review of Systems  Constitutional:  Positive for activity change, appetite change and fatigue. Negative for chills, diaphoresis, fever and unexpected weight change.  HENT: Negative.    Eyes: Negative.  Negative for  photophobia and visual disturbance.  Respiratory:  Negative for cough, chest tightness and shortness of breath.   Cardiovascular:  Negative for chest pain, palpitations and leg swelling.  Gastrointestinal:  Negative for abdominal pain, blood in stool, constipation, diarrhea, nausea and vomiting.  Endocrine: Positive for cold intolerance. Negative for heat intolerance, polydipsia, polyphagia and polyuria.  Genitourinary:  Negative for decreased urine volume, difficulty urinating, dysuria, frequency and urgency.  Musculoskeletal:  Negative for arthralgias and myalgias.  Skin: Negative.   Allergic/Immunologic: Negative.   Neurological:  Negative for dizziness, tremors, seizures, syncope, facial asymmetry, speech difficulty, weakness, light-headedness, numbness and headaches.       Restless leg syndrome  Hematological: Negative.   Psychiatric/Behavioral:  Negative for confusion, hallucinations, sleep disturbance and suicidal ideas.   All other systems reviewed and are negative.      Objective:  BP (!) 147/80   Pulse 74   Temp 97.8 F (36.6 C)   Ht 5\' 8"  (1.727 m)   Wt 241 lb (109.3 kg)   SpO2 98%   BMI 36.64 kg/m    Wt Readings from Last 3 Encounters:  04/16/21 241 lb (109.3 kg)  02/27/21 239 lb (108.4 kg)  02/21/21 233 lb 6.4 oz (105.9 kg)    Physical Exam Vitals and nursing note reviewed.  Constitutional:      General: She is not in acute distress.    Appearance: Normal appearance. She is well-developed and well-groomed. She is obese. She is not  ill-appearing, toxic-appearing or diaphoretic.  HENT:     Head: Normocephalic and atraumatic.     Jaw: There is normal jaw occlusion.     Right Ear: Hearing normal.     Left Ear: Hearing normal.     Nose: Nose normal.     Mouth/Throat:     Lips: Pink.     Mouth: Mucous membranes are moist.     Pharynx: Oropharynx is clear. Uvula midline.  Eyes:     General: Lids are normal.     Extraocular Movements: Extraocular movements intact.     Conjunctiva/sclera: Conjunctivae normal.     Pupils: Pupils are equal, round, and reactive to light.  Neck:     Thyroid: No thyroid mass, thyromegaly or thyroid tenderness.     Vascular: No carotid bruit or JVD.     Trachea: Trachea and phonation normal.  Cardiovascular:     Rate and Rhythm: Normal rate and regular rhythm.     Chest Wall: PMI is not displaced.     Pulses: Normal pulses.     Heart sounds: Normal heart sounds. No murmur heard.   No friction rub. No gallop.  Pulmonary:     Effort: Pulmonary effort is normal. No respiratory distress.     Breath sounds: Normal breath sounds. No wheezing.  Abdominal:     General: Bowel sounds are normal. There is no abdominal bruit.     Palpations: Abdomen is soft. There is no hepatomegaly or splenomegaly.  Musculoskeletal:        General: Normal range of motion.     Cervical back: Normal range of motion and neck supple.     Right lower leg: No edema.     Left lower leg: No edema.  Lymphadenopathy:     Cervical: No cervical adenopathy.  Skin:    General: Skin is warm and dry.     Capillary Refill: Capillary refill takes less than 2 seconds.     Coloration: Skin is not cyanotic, jaundiced or pale.  Findings: No rash.  Neurological:     General: No focal deficit present.     Mental Status: She is alert and oriented to person, place, and time.     Sensory: Sensation is intact.     Motor: Motor function is intact.     Coordination: Coordination is intact.     Gait: Gait is intact.     Deep  Tendon Reflexes: Reflexes are normal and symmetric.  Psychiatric:        Attention and Perception: Attention and perception normal.        Mood and Affect: Mood and affect normal.        Speech: Speech normal.        Behavior: Behavior normal. Behavior is cooperative.        Thought Content: Thought content normal.        Cognition and Memory: Cognition and memory normal.        Judgment: Judgment normal.    Results for orders placed or performed during the hospital encounter of 04/01/21  ECHOCARDIOGRAM COMPLETE  Result Value Ref Range   Single Plane A2C EF 48.0 %   Single Plane A4C EF 61.7 %   Calc EF 56.7 %   AR max vel 2.51 cm2   AV Area VTI 2.23 cm2   AV Mean grad 6.0 mmHg   AV Peak grad 11.3 mmHg   Ao pk vel 1.68 m/s   AV Area mean vel 2.07 cm2   MV VTI 3.42 cm2   Area-P 1/2 4.65 cm2   S' Lateral 3.00 cm       Pertinent labs & imaging results that were available during my care of the patient were reviewed by me and considered in my medical decision making.  Assessment & Plan:  [place] was seen today for 8 week f/u.  Diagnoses and all orders for this visit:  Acquired hypothyroidism Last TSH was low, T3 and T4 were normal. She still reports hypothyroidism symptoms. Will recheck thyroid panel with thyroid antibodies. Will adjust repletion therapy if warranted.  -     Thyroid antibodies -     Thyroid Panel With TSH  Restless leg syndrome, controlled Ongoing symptoms despite Requip therapy. Will check Vit B12 along with iron panel for potential underlying causes. Will provide pt with PRN sinemet immediate release for symptom control. Pt aware to report any new, worsening, or persistent symptoms.  -     Iron, TIBC and Ferritin Panel -     Vitamin B12 -     carbidopa-levodopa (SINEMET IR) 10-100 MG tablet; Take 1 tablet by mouth 3 (three) times daily as needed.  Mild intermittent asthma without complication Well controlled with below. Will refill today.  -     albuterol  (VENTOLIN HFA) 108 (90 Base) MCG/ACT inhaler; Inhale 2 puffs into the lungs every 6 (six) hours as needed for wheezing or shortness of breath.  Palpitations Has followed up with cardiology and was started on BB. Symptoms well controlled with BB therapy.  -     Iron, TIBC and Ferritin Panel -     Thyroid Panel With TSH  Will refill medications once labs have resulted.    Continue all other maintenance medications.  Follow up plan: Return in about 3 months (around 07/17/2021), or if symptoms worsen or fail to improve, for thyroid.   Continue healthy lifestyle choices, including diet (rich in fruits, vegetables, and lean proteins, and low in salt and simple carbohydrates) and exercise (at least  30 minutes of moderate physical activity daily).  Educational handout given for RLS  The above assessment and management plan was discussed with the patient. The patient verbalized understanding of and has agreed to the management plan. Patient is aware to call the clinic if they develop any new symptoms or if symptoms persist or worsen. Patient is aware when to return to the clinic for a follow-up visit. Patient educated on when it is appropriate to go to the emergency department.   Monia Pouch, FNP-C [facility] [phone]

## 2021-04-17 ENCOUNTER — Encounter: Payer: Self-pay | Admitting: Family Medicine

## 2021-04-17 LAB — THYROID PANEL WITH TSH
Free Thyroxine Index: 1.3 (ref 1.2–4.9)
T3 Uptake Ratio: 22 % — ABNORMAL LOW (ref 24–39)
T4, Total: 6 ug/dL (ref 4.5–12.0)
TSH: 0.746 u[IU]/mL (ref 0.450–4.500)

## 2021-04-17 LAB — IRON,TIBC AND FERRITIN PANEL
Ferritin: 22 ng/mL (ref 15–150)
Iron Saturation: 7 % — CL (ref 15–55)
Iron: 25 ug/dL — ABNORMAL LOW (ref 27–159)
Total Iron Binding Capacity: 369 ug/dL (ref 250–450)
UIBC: 344 ug/dL (ref 131–425)

## 2021-04-17 LAB — THYROID ANTIBODIES
Thyroglobulin Antibody: <1 IU/mL (ref 0.0–0.9)
Thyroperoxidase Ab SerPl-aCnc: 9 IU/mL (ref 0–34)

## 2021-04-17 LAB — VITAMIN B12: Vitamin B-12: >2000 pg/mL — ABNORMAL HIGH (ref 232–1245)

## 2021-04-17 MED ORDER — IRON (FERROUS SULFATE) 325 (65 FE) MG PO TABS: 325.0000 mg | ORAL_TABLET | Freq: Every day | ORAL | 1 refills | Status: DC

## 2021-04-17 MED ORDER — NP THYROID 60 MG PO TABS: 60.0000 mg | ORAL_TABLET | Freq: Every day | ORAL | 1 refills | Status: DC

## 2021-04-17 MED ORDER — THYROID 90 MG PO TABS: 90.0000 mg | ORAL_TABLET | Freq: Every day | ORAL | 1 refills | Status: DC

## 2021-04-17 NOTE — Addendum Note (Signed)
Addended by: Baruch Gouty on: 04/17/2021 03:56 PM   Modules accepted: Orders

## 2021-04-17 NOTE — Addendum Note (Signed)
Addended by: Baruch Gouty on: 04/17/2021 07:56 AM   Modules accepted: Orders

## 2021-05-09 ENCOUNTER — Telehealth: Payer: BC Managed Care – PPO | Admitting: Cardiology

## 2021-05-21 ENCOUNTER — Other Ambulatory Visit: Payer: Self-pay | Admitting: Family Medicine

## 2021-05-21 DIAGNOSIS — G2581 Restless legs syndrome: Secondary | ICD-10-CM

## 2021-06-06 ENCOUNTER — Other Ambulatory Visit: Payer: Self-pay

## 2021-06-06 ENCOUNTER — Encounter: Payer: Self-pay | Admitting: Medical

## 2021-06-06 ENCOUNTER — Ambulatory Visit (INDEPENDENT_AMBULATORY_CARE_PROVIDER_SITE_OTHER): Payer: BC Managed Care – PPO | Admitting: Medical

## 2021-06-06 VITALS — BP 148/80 | HR 74 | Ht 68.0 in | Wt 255.0 lb

## 2021-06-06 DIAGNOSIS — R002 Palpitations: Secondary | ICD-10-CM

## 2021-06-06 DIAGNOSIS — I10 Essential (primary) hypertension: Secondary | ICD-10-CM | POA: Diagnosis not present

## 2021-06-06 DIAGNOSIS — R0602 Shortness of breath: Secondary | ICD-10-CM | POA: Diagnosis not present

## 2021-06-06 DIAGNOSIS — G4733 Obstructive sleep apnea (adult) (pediatric): Secondary | ICD-10-CM | POA: Diagnosis not present

## 2021-06-06 MED ORDER — METOPROLOL SUCCINATE ER 50 MG PO TB24: 50.0000 mg | ORAL_TABLET | Freq: Every day | ORAL | 3 refills | Status: DC

## 2021-06-06 NOTE — Patient Instructions (Signed)
Medication Instructions:   Increase Toprol XL to 50 mg Daily   *If you need a refill on your cardiac medications before your next appointment, please call your pharmacy*   Lab Work: NONE   If you have labs (blood work) drawn today and your tests are completely normal, you will receive your results only by: [place] (if you have MyChart) OR A paper copy in the mail If you have any lab test that is abnormal or we need to change your treatment, we will call you to review the results.   Testing/Procedures: NONE    Follow-Up: At [facility], you and your health needs are our priority.  As part of our continuing mission to provide you with exceptional heart care, we have created designated Provider Care Teams.  These Care Teams include your primary Cardiologist (physician) and Advanced Practice Providers (APPs -  Physician Assistants and Nurse Practitioners) who all work together to provide you with the care you need, when you need it.  We recommend signing up for the patient portal called "MyChart".  Sign up information is provided on this After Visit Summary.  MyChart is used to connect with patients for Virtual Visits (Telemedicine).  Patients are able to view lab/test results, encounter notes, upcoming appointments, etc.  Non-urgent messages can be sent to your provider as well.   To learn more about what you can do with MyChart, go to [email or web address].    Your next appointment:   6 month(s)  The format for your next appointment:   In Person  Provider:   Bernerd Pho, PA-C or Ermalinda Barrios, PA-C    Other Instructions Thank you for choosing [facility]!

## 2021-06-06 NOTE — Progress Notes (Signed)
Cardiology Office Note:    Date:  06/06/2021   ID:  Candace Rodriguez, DOB 10-01-1965, MRN [id number]  PCP:  Janora Norlander, DO  [facility] Cardiologist:  None  [facility] Electrophysiologist:  None   Referring MD: Janora Norlander, DO   Chief Complaint: FU heart monitor  History of Present Illness:    Candace Rodriguez is a 56 y.o. female with a hx of asthma, depression, HTN, HLD, morbid obesity s/p vertical sleeve gasterectomy, RLS, OSA who is referred for evaluation of heart monitor.   She was seen 02/27/21 for tachycardia and palpitations. Reported her heart rate will get as fast as 130-140bpm and last several hours. One night she had an episode that awakened her from sleep with her HR in the 130's.  This was associated with dizziness and SOB.  She has an extensive fm hx of atrial fibrillation.  She also has a fm hx of SCD.  She had a Maternal uncle (age 28's) and first cousin (age 53's) who had SCD. 30 day Heart was ordered as well as an echo.   Echo showed normal LVEF. Heart monitor showed NSR with several episodes of atrial tachycardic and she was started Toprol XL 25mg  daily.    She has a hx of OSA dx in 2009 and started on CPAP but has not been on CPAP in years.   Today, heart monitor and echo were briefly discussed. The patient report she feels fine. She has been doing BP at home. It was been 110-130/60-70s. HR 70s. She still has occasional palpitations. She is not having any more dizziness, SOB, lightheadedness. No chest pain. No LLE, orthopnea, pnd. She is at the gym three days a week with weights, cardio and stretching. She is walking as well. Diet is generally healthy. She is trying to lose weight.   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   Calcium 200 MG TABS Take by mouth 2 (two) times daily.    carbidopa-levodopa (SINEMET IR) 10-100 MG tablet TAKE ONE TABLET BY MOUTH THREE TIMES DAILY AS NEEDED   Cats Claw, Uncaria tomentosa, (CATS CLAW PO) Take 500 mg by mouth  3 (three) times daily.   Cholecalciferol (VITAMIN D3 PO) Take 20,000 mg by mouth daily.   clindamycin (CLEOCIN) 300 MG capsule Take 2 capsules by mouth as needed.    diclofenac (VOLTAREN) 75 MG EC tablet Take 75 mg by mouth 2 (two) times daily.   diphenhydrAMINE HCl (BENADRYL ALLERGY PO) Take by mouth as needed.   Iron, Ferrous Sulfate, 325 (65 Fe) MG TABS Take 325 mg by mouth daily.   levothyroxine (SYNTHROID) 25 MCG tablet Take 25 mcg by mouth daily before breakfast.   Magnesium Malate POWD 1,500 mg by Does not apply route.   Melatonin 10 MG TABS Take by mouth daily.   Menaquinone-7 (VITAMIN K2 PO) Take 150 mcg by mouth daily.   metoprolol succinate (TOPROL XL) 50 MG 24 hr tablet Take 1 tablet (50 mg total) by mouth daily.   Multiple Vitamin (MULTIVITAMIN) tablet Take 1 tablet by  mouth 3 (three) times daily. Celebrate Bariatric   NP THYROID 60 MG tablet Take 1 tablet (60 mg total) by mouth at bedtime.   OVER THE COUNTER MEDICATION 120 mg 2 (two) times daily. Ginko Biloba   OVER THE COUNTER MEDICATION 240 mg daily. pine bark extract   Probiotic Product (PROBIOTIC PO) Take by mouth daily.   rOPINIRole (REQUIP) 2 MG tablet Take 2 mg by mouth at bedtime.   thyroid (ARMOUR) 90 MG tablet Take 1 tablet (90 mg total) by mouth daily.   [DISCONTINUED] metoprolol succinate (TOPROL XL) 25 MG 24 hr tablet Take 1 tablet (25 mg total) by mouth daily.     Allergies:   Avelox [moxifloxacin], Gluten meal, Other, Penicillin g, Penicillins, Quinolones, and Soy allergy   Social History   Socioeconomic History   Marital status: Married    Spouse name: Not on file   Number of children: Not on file   Years of education: Not on file   Highest education level: Not on file  Occupational History   Occupation: Theme park manager and Land  Tobacco Use   Smoking status: Former    Packs/day: 2.00    Years: 10.00    Pack years: 20.00    Types: Cigarettes    Quit date: 05/26/1998    Years since quitting: 23.0   Smokeless tobacco: Never  Vaping Use   Vaping Use: Never used  Substance and Sexual Activity   Alcohol use: No    Alcohol/week: 0.0 standard drinks   Drug use: No   Sexual activity: Not on file  Other Topics Concern   Not on file  Social History Narrative   Not on file   Social Determinants of Health   Financial Resource Strain: Not on file  Food Insecurity: Not on file  Transportation Needs: Not on file  Physical Activity: Not on file  Stress: Not on file  Social Connections: Not on file     Family History: The patient's family history includes Arthritis in her father and mother; Atrial fibrillation in her brother; Cancer in her maternal grandfather, maternal grandmother, and paternal grandmother; Heart disease in her maternal grandmother and paternal  grandfather; Hypertension in her father.  ROS:   Please see the history of present illness.     All other systems reviewed and are negative.  EKGs/Labs/Other Studies Reviewed:    The following studies were reviewed today:  Echo 04/02/21  1. Left ventricular ejection fraction, by estimation, is 55 to 60%. The  left ventricle has normal function. The left ventricle has no regional  wall motion abnormalities.  Left ventricular diastolic parameters were  normal.   2. Right ventricular systolic function is normal. The right ventricular  size is normal. There is normal pulmonary artery systolic pressure.   3. The mitral valve is normal in structure. No evidence of mitral valve  regurgitation. No evidence of mitral stenosis.   4. The tricuspid valve is abnormal.   5. The aortic valve is tricuspid. Aortic valve regurgitation is not  visualized. No aortic stenosis is present.   6. The inferior vena cava is normal in size with greater than 50%  respiratory variability, suggesting right atrial pressure of 3 mmHg.   Heart monitor 02/2021 Baseline EKG showed NSR with average heart rate 81bpm and ranged from 54 to 143bpm. Nonsustained atrial tachcyardia   EKG:  EKG is not ordered today.   Recent Labs: 02/21/2021: ALT 21; BUN 13; Creatinine, Ser 0.59; Hemoglobin 15.4; Platelets 238; Potassium 4.0; Sodium 143 04/16/2021: TSH 0.746  Recent Lipid Panel    Component Value Date/Time   CHOL 224 (H) 02/21/2021 1216   TRIG 89 02/21/2021 1216   TRIG 79 11/07/2013 0906   HDL 61 02/21/2021 1216   HDL 51 11/07/2013 0906   CHOLHDL 3.7 02/21/2021 1216   LDLCALC 147 (H) 02/21/2021 1216   LDLCALC 110 (H) 11/07/2013 0906    Physical Exam:    VS:  BP (!) 148/80    Pulse 74    Ht 5\' 8"  (1.727 m)    Wt 255 lb (115.7 kg)    SpO2 96%    BMI 38.77 kg/m     Wt Readings from Last 3 Encounters:  06/06/21 255 lb (115.7 kg)  04/16/21 241 lb (109.3 kg)  02/27/21 239 lb (108.4 kg)     GEN:  Well  nourished, well developed in no acute distress HEENT: Normal NECK: No JVD; No carotid bruits LYMPHATICS: No lymphadenopathy CARDIAC: RRR, no murmurs, rubs, gallops RESPIRATORY:  Clear to auscultation without rales, wheezing or rhonchi  ABDOMEN: Soft, non-tender, non-distended MUSCULOSKELETAL:  No edema; No deformity  SKIN: Warm and dry NEUROLOGIC:  Alert and oriented x 3 PSYCHIATRIC:  Normal affect   ASSESSMENT:    1. Palpitations   2. SOB (shortness of breath)   3. OSA (obstructive sleep apnea)   4. Essential hypertension    PLAN:    In order of problems listed above:  Palpitations Heart monitor showed NSR with brief runs of atrial tachycardia and she was started on Toprol-XL 50mg  daily. She reports symptom improvement with this, although still has occasional palpitations. I will increase Toprol to 50mg  daily. BP and HR good today.   SOB Echo showed LVEF 55-60%, no WMA. SOB resolved as above, she is exercising multiple times a week and has no issues with this.   OSA Resolved with weight loss.   HTN BP good at home. Here BP is mildly elevated. Increase Toprol as above.   Disposition: Follow up in 6 month(s) with MD/APP    Signed, Arlissa Monteverde Ninfa Meeker, PA-C  06/06/2021 3:13 PM    [facility]

## 2021-07-05 ENCOUNTER — Other Ambulatory Visit: Payer: Self-pay | Admitting: Family Medicine

## 2021-07-05 DIAGNOSIS — G2581 Restless legs syndrome: Secondary | ICD-10-CM

## 2021-07-11 ENCOUNTER — Other Ambulatory Visit: Payer: Self-pay | Admitting: Family Medicine

## 2021-07-11 DIAGNOSIS — E611 Iron deficiency: Secondary | ICD-10-CM

## 2021-07-17 ENCOUNTER — Encounter: Payer: Self-pay | Admitting: Family Medicine

## 2021-07-17 ENCOUNTER — Ambulatory Visit: Payer: BC Managed Care – PPO | Admitting: Family Medicine

## 2021-07-17 VITALS — BP 127/69 | HR 62 | Temp 98.3°F | Ht 68.0 in | Wt 257.2 lb

## 2021-07-17 DIAGNOSIS — E039 Hypothyroidism, unspecified: Secondary | ICD-10-CM

## 2021-07-17 DIAGNOSIS — E611 Iron deficiency: Secondary | ICD-10-CM | POA: Diagnosis not present

## 2021-07-17 DIAGNOSIS — I499 Cardiac arrhythmia, unspecified: Secondary | ICD-10-CM

## 2021-07-17 DIAGNOSIS — G2581 Restless legs syndrome: Secondary | ICD-10-CM | POA: Diagnosis not present

## 2021-07-17 DIAGNOSIS — E785 Hyperlipidemia, unspecified: Secondary | ICD-10-CM

## 2021-07-17 MED ORDER — ROPINIROLE HCL 1 MG PO TABS: 2.0000 mg | ORAL_TABLET | Freq: Every day | ORAL | 3 refills | Status: DC

## 2021-07-17 MED ORDER — LEVOTHYROXINE SODIUM 100 MCG PO TABS: 100.0000 ug | ORAL_TABLET | Freq: Every day | ORAL | 0 refills | Status: DC
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
## 2021-07-18 ENCOUNTER — Other Ambulatory Visit: Payer: BC Managed Care – PPO

## 2021-07-18 DIAGNOSIS — E785 Hyperlipidemia, unspecified: Secondary | ICD-10-CM

## 2021-07-18 DIAGNOSIS — E611 Iron deficiency: Secondary | ICD-10-CM

## 2021-07-18 DIAGNOSIS — E039 Hypothyroidism, unspecified: Secondary | ICD-10-CM

## 2021-07-18 LAB — LIPID PANEL

## 2021-07-18 LAB — BAYER DCA HB A1C WAIVED: HB A1C (BAYER DCA - WAIVED): 5.2 % (ref 4.8–5.6)

## 2021-07-19 LAB — CMP14+EGFR
ALT: 30 IU/L (ref 0–32)
AST: 20 IU/L (ref 0–40)
Albumin/Globulin Ratio: 2.3 — ABNORMAL HIGH (ref 1.2–2.2)
Albumin: 4.3 g/dL (ref 3.8–4.9)
Alkaline Phosphatase: 79 IU/L (ref 44–121)
BUN/Creatinine Ratio: 16 (ref 9–23)
BUN: 11 mg/dL (ref 6–24)
Bilirubin Total: 0.9 mg/dL (ref 0.0–1.2)
CO2: 24 mmol/L (ref 20–29)
Calcium: 9.9 mg/dL (ref 8.7–10.2)
Chloride: 106 mmol/L (ref 96–106)
Creatinine, Ser: 0.69 mg/dL (ref 0.57–1.00)
Globulin, Total: 1.9 g/dL (ref 1.5–4.5)
Glucose: 89 mg/dL (ref 70–99)
Potassium: 4.9 mmol/L (ref 3.5–5.2)
Sodium: 143 mmol/L (ref 134–144)
Total Protein: 6.2 g/dL (ref 6.0–8.5)
eGFR: 102 mL/min/{1.73_m2} (ref >59)

## 2021-07-19 LAB — CBC
Hematocrit: 46.1 % (ref 34.0–46.6)
Hemoglobin: 15.6 g/dL (ref 11.1–15.9)
MCH: 27.5 pg (ref 26.6–33.0)
MCHC: 33.8 g/dL (ref 31.5–35.7)
MCV: 81 fL (ref 79–97)
Platelets: 203 10*3/uL (ref 150–450)
RBC: 5.67 x10E6/uL — ABNORMAL HIGH (ref 3.77–5.28)
RDW: 14.4 % (ref 11.7–15.4)
WBC: 5.3 10*3/uL (ref 3.4–10.8)

## 2021-07-19 LAB — LIPID PANEL
Chol/HDL Ratio: 3.3 ratio (ref 0.0–4.4)
Cholesterol, Total: 223 mg/dL — ABNORMAL HIGH (ref 100–199)
HDL: 67 mg/dL (ref >39)
LDL Chol Calc (NIH): 138 mg/dL — ABNORMAL HIGH (ref 0–99)
Triglycerides: 105 mg/dL (ref 0–149)
VLDL Cholesterol Cal: 18 mg/dL (ref 5–40)

## 2021-07-19 LAB — T4, FREE: Free T4: 0.91 ng/dL (ref 0.82–1.77)

## 2021-07-19 LAB — TSH: TSH: 4.03 u[IU]/mL (ref 0.450–4.500)

## 2021-07-19 LAB — FERRITIN: Ferritin: 65 ng/mL (ref 15–150)

## 2021-07-23 ENCOUNTER — Encounter: Payer: Self-pay | Admitting: Family Medicine

## 2021-07-30 ENCOUNTER — Ambulatory Visit: Payer: BC Managed Care – PPO | Admitting: Nurse Practitioner

## 2021-07-31 ENCOUNTER — Encounter: Payer: Self-pay | Admitting: Family Medicine

## 2021-07-31 DIAGNOSIS — M8588 Other specified disorders of bone density and structure, other site: Secondary | ICD-10-CM

## 2021-08-01 NOTE — Telephone Encounter (Signed)
Dexascan ordered placed- please schedule her for procedure ?

## 2021-08-02 ENCOUNTER — Other Ambulatory Visit (INDEPENDENT_AMBULATORY_CARE_PROVIDER_SITE_OTHER): Payer: BC Managed Care – PPO

## 2021-08-02 DIAGNOSIS — M8588 Other specified disorders of bone density and structure, other site: Secondary | ICD-10-CM | POA: Diagnosis not present

## 2021-08-12 ENCOUNTER — Other Ambulatory Visit: Payer: Self-pay

## 2021-08-12 ENCOUNTER — Ambulatory Visit (HOSPITAL_COMMUNITY)
Admission: RE | Admit: 2021-08-12 | Discharge: 2021-08-12 | Disposition: A | Discharge status: Home / Self Care | Payer: BC Managed Care – PPO | Source: Ambulatory Visit | Attending: Family Medicine | Admitting: Family Medicine

## 2021-08-12 ENCOUNTER — Ambulatory Visit: Payer: BC Managed Care – PPO | Admitting: Family Medicine

## 2021-08-12 ENCOUNTER — Encounter: Payer: Self-pay | Admitting: Family Medicine

## 2021-08-12 VITALS — BP 124/78 | HR 73 | Temp 98.1°F | Ht 68.0 in | Wt 260.5 lb

## 2021-08-12 DIAGNOSIS — M7989 Other specified soft tissue disorders: Secondary | ICD-10-CM | POA: Diagnosis present

## 2021-08-12 DIAGNOSIS — M79662 Pain in left lower leg: Secondary | ICD-10-CM | POA: Insufficient documentation

## 2021-08-12 NOTE — Progress Notes (Signed)
? ?Acute Office Visit ? ?Subjective:  ? ? Patient ID: Candace Rodriguez, female    DOB: 05-Feb-1966, 56 y.o.   MRN: [id number] ? ?Chief Complaint  ?Patient presents with  ? Cellulitis  ? ? ?HPI ?Patient is in today for left lower leg pain and swelling. ? ?She fractured her left patella 4 weeks ago. 2 weeks ago she started seeing red streaks down the leg with warmth and swelling. Her ortho put her on keflex for cellulitis when this occurred. She has been on keflex for 2 weeks now. She reports the redness and swelling has improved, but has not resolved. She also started having pain in her lower leg 3-4 days ago. The pain is anterior at times and posterior at time. The pain is sometime shooting, sometimes tingling. It is tender along the anterior at times. There is warmth. She had a replacement on this knee 13 years ago. She [facility]. She has Covid within the last 2 months and is concerned about a possible blood clot. She denies fever, drainage, shortness of breath, or chest pain. Denies recent surgeries or history of PE or DVT. She has a follow up appt with ortho later this week, although she reports that her orthopedic does not feel that he is the provider that should be addressing this.  ? ?Past Medical History:  ?Diagnosis Date  ? Asthma   ? Basal cell carcinoma 04/13/2019  ? left upper back-cx3 43f  ? Depression   ? DJD (degenerative joint disease)   ? Fractures   ? Left distal radius,  left patella  ? History of COVID-19 07/01/2019  ? Hyperlipidemia   ? Hypertension   ? Lyme disease   ? Migraine   ? Obesity   ? [place]  ? OSA (obstructive sleep apnea)   ? [place] [facility] [facility] [place] ? Restless leg syndrome   ? SCC (squamous cell carcinoma) 04/13/2019  ? right side nose-[id number] ? Sleep apnea   ? 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 left cheek-[id number]? Thyroid disease   ? Tinnitus   ? ? ?Past Surgical History:  ?Procedure Laterality Date  ?  CHOLECYSTECTOMY  10/2013  ? JOINT REPLACEMENT Bilateral 10/2008  ?  knee each partial replacement  ? KNEE SURGERY    ? LAPAROSCOPIC ABDOMINAL EXPLORATION  06/1993  ? PARTIAL GASTRECTOMY  11/16/2012  ? VSG   ? WRIST SURGERY  09/1998  ? L  ? ? ?Family History  ?Problem Relation Age of Onset  ? Arthritis Mother   ? Cancer Maternal Grandmother   ? Heart disease Maternal Grandmother   ? Cancer Maternal Grandfather   ? Cancer Paternal Grandmother   ? Heart disease Paternal Grandfather   ? Arthritis Father   ? Hypertension Father   ? Atrial fibrillation Brother   ? ? ?Social History  ? ?Socioeconomic History  ? Marital status: Married  ?  Spouse name: Not on file  ? Number of children: Not on file  ? Years of education: Not on file  ? Highest education level: Not on file  ?Occupational History  ? Occupation: SpeEstate agentTobacco Use  ? Smoking status: Former  ?  Packs/day: 2.00  ?  Years: 10.00  ?  Pack years: 20.00  ?  Types: Cigarettes  ?  Quit date: 05/26/1998  ?  Years since quitting: 23.2  ? Smokeless tobacco: Never  ?  Vaping Use  ? Vaping Use: Never used  ?Substance and Sexual Activity  ? Alcohol use: No  ?  Alcohol/week: 0.0 standard drinks  ? Drug use: No  ? Sexual activity: Not on file  ?Other Topics Concern  ? Not on file  ?Social History Narrative  ? Not on file  ? ?Social Determinants of Health  ? ?Financial Resource Strain: Not on file  ?Food Insecurity: Not on file  ?Transportation Needs: Not on file  ?Physical Activity: Not on file  ?Stress: Not on file  ?Social Connections: Not on file  ?Intimate Partner Violence: Not on file  ? ? ?Outpatient Medications Prior to Visit  ?Medication Sig Dispense Refill  ? albuterol (VENTOLIN HFA) 108 (90 Base) MCG/ACT inhaler Inhale 2 puffs into the lungs every 6 (six) hours as needed for wheezing or shortness of breath. 18 g 11  ? Calcium 200 MG TABS Take by mouth 2 (two) times daily.     ? carbidopa-levodopa (SINEMET IR) 10-100 MG tablet TAKE ONE TABLET BY  MOUTH THREE TIMES DAILY AS NEEDED 90 tablet 0  ? Cats Claw, Uncaria tomentosa, (CATS CLAW PO) Take 500 mg by mouth 3 (three) times daily.    ? Cholecalciferol (VITAMIN D3 PO) Take 20,000 mg by mouth daily.    ? diclofenac (VOLTAREN) 75 MG EC tablet Take 75 mg by mouth 2 (two) times daily.    ? diphenhydrAMINE HCl (BENADRYL ALLERGY PO) Take by mouth as needed.    ? Iron, Ferrous Sulfate, 325 (65 Fe) MG TABS Take one tablet by mouth every day 90 tablet 1  ? levothyroxine (SYNTHROID) 100 MCG tablet Take 1 tablet (100 mcg total) by mouth daily before breakfast. 90 tablet 0  ? Magnesium Malate POWD 1,500 mg by Does not apply route.    ? Melatonin 10 MG TABS Take by mouth daily.    ? metoprolol succinate (TOPROL XL) 50 MG 24 hr tablet Take 1 tablet (50 mg total) by mouth daily. 90 tablet 3  ? Multiple Vitamin (MULTIVITAMIN) tablet Take 1 tablet by mouth 3 (three) times daily. Celebrate Bariatric    ? OVER THE COUNTER MEDICATION 120 mg 2 (two) times daily. Ginko Biloba    ? OVER THE COUNTER MEDICATION 240 mg daily. pine bark extract    ? Probiotic Product (PROBIOTIC PO) Take by mouth daily.    ? rOPINIRole (REQUIP) 1 MG tablet Take 2 tablets (2 mg total) by mouth at bedtime. 180 tablet 3  ? cephALEXin (KEFLEX) 500 MG capsule Take 500 mg by mouth 4 (four) times daily.    ? clindamycin (CLEOCIN) 300 MG capsule Take 2 capsules by mouth as needed.  (Patient not taking: Reported on 08/12/2021)    ? ?No facility-administered medications prior to visit.  ? ? ?Allergies  ?Allergen Reactions  ? Avelox [Moxifloxacin]   ? Gluten Meal Other (See Comments)  ?  Joint Pain  ? Other Other (See Comments)  ?  Dairy intolerance  ? Penicillin G Itching and Other (See Comments)  ?  Turns red in color.  ? Penicillins   ? Quinolones Other (See Comments)  ?  Pt can't remeber  ? Soy Allergy Rash  ?  Rash in mouth.  ? ? ?Review of Systems ?As per HPI.  ?   ?Objective:  ?  ?Physical Exam ?Vitals and nursing note reviewed.  ?Constitutional:   ?    General: She is not in acute distress. ?   Appearance: She is not ill-appearing, toxic-appearing  or diaphoretic.  ?Cardiovascular:  ?   Rate and Rhythm: Normal rate and regular rhythm.  ?   Heart sounds: Normal heart sounds. No murmur heard. ?Pulmonary:  ?   Effort: Pulmonary effort is normal. No respiratory distress.  ?   Breath sounds: Normal breath sounds.  ?Musculoskeletal:  ?   Left lower leg: Tenderness (anterior aspect) present. 2+ Pitting Edema present.  ?   Comments: Erythema along anterior aspect on left lower leg. 2+ pitting edema to left lower leg below knee. Varicose veins present. No warmth or drainage. Full ROM. Sensation intact.   ?Skin: ?   General: Skin is warm and dry.  ?Neurological:  ?   Mental Status: She is alert.  ?Psychiatric:     ?   Mood and Affect: Mood normal.     ?   Behavior: Behavior normal.  ? ? ?BP 124/78   Pulse 73   Temp 98.1 ?F (36.7 ?C) (Temporal)   Ht 5' 8"  (1.727 m)   Wt 260 lb 8 oz (118.2 kg)   BMI 39.61 kg/m?  ?Wt Readings from Last 3 Encounters:  ?08/12/21 260 lb 8 oz (118.2 kg)  ?07/17/21 257 lb 3.2 oz (116.7 kg)  ?06/06/21 255 lb (115.7 kg)  ? ? ?Health Maintenance Due  ?Topic Date Due  ? COVID-19 Vaccine (1) Never done  ? PAP SMEAR-Modifier  02/22/2019  ? MAMMOGRAM  08/08/2021  ? ? ?There are no preventive care reminders to display for this patient. ? ? ?Lab Results  ?Component Value Date  ? TSH 4.030 07/18/2021  ? ?Lab Results  ?Component Value Date  ? WBC 5.3 07/18/2021  ? HGB 15.6 07/18/2021  ? HCT 46.1 07/18/2021  ? MCV 81 07/18/2021  ? PLT 203 07/18/2021  ? ?Lab Results  ?Component Value Date  ? NA 143 07/18/2021  ? K 4.9 07/18/2021  ? CO2 24 07/18/2021  ? GLUCOSE 89 07/18/2021  ? BUN 11 07/18/2021  ? CREATININE 0.69 07/18/2021  ? BILITOT 0.9 07/18/2021  ? ALKPHOS 79 07/18/2021  ? AST 20 07/18/2021  ? ALT 30 07/18/2021  ? PROT 6.2 07/18/2021  ? ALBUMIN 4.3 07/18/2021  ? CALCIUM 9.9 07/18/2021  ? EGFR 102 07/18/2021  ? ?Lab Results  ?Component Value Date  ? CHOL  223 (H) 07/18/2021  ? ?Lab Results  ?Component Value Date  ? HDL 67 07/18/2021  ? ?Lab Results  ?Component Value Date  ? LDLCALC 138 (H) 07/18/2021  ? ?Lab Results  ?Component Value Date  ? TRIG 105 07/18/2021

## 2021-08-25 ENCOUNTER — Encounter: Payer: Self-pay | Admitting: Family Medicine

## 2021-08-26 ENCOUNTER — Other Ambulatory Visit: Payer: Self-pay | Admitting: Family Medicine

## 2021-08-26 DIAGNOSIS — E611 Iron deficiency: Secondary | ICD-10-CM

## 2021-08-26 MED ORDER — IRON (FERROUS SULFATE) 325 (65 FE) MG PO TABS: 1.0000 | ORAL_TABLET | Freq: Two times a day (BID) | ORAL | 1 refills | Status: DC

## 2021-09-03 ENCOUNTER — Encounter: Payer: Self-pay | Admitting: Family Medicine

## 2021-09-03 ENCOUNTER — Ambulatory Visit: Payer: BC Managed Care – PPO | Admitting: Family Medicine

## 2021-09-03 VITALS — BP 124/76 | HR 80 | Temp 97.9°F | Ht 68.0 in | Wt 258.4 lb

## 2021-09-03 DIAGNOSIS — E611 Iron deficiency: Secondary | ICD-10-CM

## 2021-09-03 DIAGNOSIS — E039 Hypothyroidism, unspecified: Secondary | ICD-10-CM

## 2021-09-03 DIAGNOSIS — R5383 Other fatigue: Secondary | ICD-10-CM

## 2021-09-03 NOTE — Patient Instructions (Signed)
You had labs performed today.  You will be contacted with the results of the labs once they are available, usually in the next 3 business days for routine lab work.  If you have an active my chart account, they will be released to your MyChart.  If you prefer to have these labs released to you via telephone, please let us know.     

## 2021-09-03 NOTE — Progress Notes (Signed)
? ?Subjective: ?CC: Follow-up hypothyroidism ?PCP: Janora Norlander, DO ?WER:[id number] R Corona is a 56 y.o. female presenting to clinic today for: ? ?1.  Acquired hypothyroidism ?Patient was previously on a combination of Synthroid and Armour Thyroid.  We have since transitioned her to weight-based Synthroid.  She is here for interval checkup.  She does admit to some fatigability over the last 2 weeks but is unsure if this is related to use of Ozempic.  She is currently on both Ozempic and testosterone through her bariatric clinic.  Seeing [facility] in [place]. ? ? ?ROS: Per HPI ? ?Allergies  ?Allergen Reactions  ? Avelox [Moxifloxacin]   ? Gluten Meal Other (See Comments)  ?  Joint Pain  ? Other Other (See Comments)  ?  Dairy intolerance  ? Penicillin G Itching and Other (See Comments)  ?  Turns red in color.  ? Penicillins   ? Quinolones Other (See Comments)  ?  Pt can't remeber  ? Soy Allergy Rash  ?  Rash in mouth.  ? ?Past Medical History:  ?Diagnosis Date  ? Asthma   ? Basal cell carcinoma 04/13/2019  ? left upper back-cx3 68f  ? Depression   ? DJD (degenerative joint disease)   ? Fractures   ? Left distal radius,  left patella  ? History of COVID-19 07/01/2019  ? Hyperlipidemia   ? Hypertension   ? Lyme disease   ? Migraine   ? Obesity   ? Obesity, morbid ([place] 09/13/2012  ? OSA (obstructive sleep apnea)   ? Dx by [facility] and [place] ? Restless leg syndrome   ? SCC (squamous cell carcinoma) 04/13/2019  ? right side [id number] ? Sleep apnea   ? 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 left [id number]? Thyroid disease   ? Tinnitus   ? ? ?Current Outpatient Medications:  ?  albuterol (VENTOLIN HFA) 108 (90 Base) MCG/ACT inhaler, Inhale 2 puffs into the lungs every 6 (six) hours as needed for wheezing or shortness of breath., Disp: 18 g, Rfl: 11 ?  Calcium 200 MG TABS, Take by mouth 2 (two) times daily. , Disp: , Rfl:  ?  carbidopa-levodopa (SINEMET IR) 10-100 MG  tablet, TAKE ONE TABLET BY MOUTH THREE TIMES DAILY AS NEEDED, Disp: 90 tablet, Rfl: 0 ?  Cats Claw, Uncaria tomentosa, (CATS CLAW PO), Take 500 mg by mouth 3 (three) times daily., Disp: , Rfl:  ?  Cholecalciferol (VITAMIN D3 PO), Take 20,000 mg by mouth daily., Disp: , Rfl:  ?  diclofenac (VOLTAREN) 75 MG EC tablet, Take 75 mg by mouth 2 (two) times daily., Disp: , Rfl:  ?  diphenhydrAMINE HCl (BENADRYL ALLERGY PO), Take by mouth as needed., Disp: , Rfl:  ?  Iron, Ferrous Sulfate, 325 (65 Fe) MG TABS, Take 1 tablet by mouth in the morning and at bedtime., Disp: 180 tablet, Rfl: 1 ?  levothyroxine (SYNTHROID) 100 MCG tablet, Take 1 tablet (100 mcg total) by mouth daily before breakfast., Disp: 90 tablet, Rfl: 0 ?  Magnesium Malate POWD, 1,500 mg by Does not apply route., Disp: , Rfl:  ?  Melatonin 10 MG TABS, Take by mouth daily., Disp: , Rfl:  ?  metoprolol succinate (TOPROL XL) 50 MG 24 hr tablet, Take 1 tablet (50 mg total) by mouth daily., Disp: 90 tablet, Rfl: 3 ?  Multiple Vitamin (MULTIVITAMIN) tablet, Take 1 tablet by mouth 3 (three) times daily. Celebrate Bariatric, Disp: ,  Rfl:  ?  OVER THE COUNTER MEDICATION, 120 mg 2 (two) times daily. Ginko Biloba, Disp: , Rfl:  ?  OVER THE COUNTER MEDICATION, 240 mg daily. pine bark extract, Disp: , Rfl:  ?  Probiotic Product (PROBIOTIC PO), Take by mouth daily., Disp: , Rfl:  ?  rOPINIRole (REQUIP) 1 MG tablet, Take 2 tablets (2 mg total) by mouth at bedtime., Disp: 180 tablet, Rfl: 3 ?  Semaglutide (OZEMPIC, 1 MG/DOSE, [place]), Inject into the skin., Disp: , Rfl:  ?  testosterone enanthate (DELATESTRYL) 200 MG/ML injection, Inject into the muscle every 14 (fourteen) days. For IM use only, Disp: , Rfl:  ?  clindamycin (CLEOCIN) 300 MG capsule, Take 2 capsules by mouth as needed.  (Patient not taking: Reported on 08/12/2021), Disp: , Rfl:  ?Social History  ? ?Socioeconomic History  ? Marital status: Married  ?  Spouse name: Not on file  ? Number of children: Not on file  ?  Years of education: Not on file  ? Highest education level: Not on file  ?Occupational History  ? Occupation: Estate agent  ?Tobacco Use  ? Smoking status: Former  ?  Packs/day: 2.00  ?  Years: 10.00  ?  Pack years: 20.00  ?  Types: Cigarettes  ?  Quit date: 05/26/1998  ?  Years since quitting: 23.2  ? Smokeless tobacco: Never  ?Vaping Use  ? Vaping Use: Never used  ?Substance and Sexual Activity  ? Alcohol use: No  ?  Alcohol/week: 0.0 standard drinks  ? Drug use: No  ? Sexual activity: Not on file  ?Other Topics Concern  ? Not on file  ?Social History Narrative  ? Not on file  ? ?Social Determinants of Health  ? ?Financial Resource Strain: Not on file  ?Food Insecurity: Not on file  ?Transportation Needs: Not on file  ?Physical Activity: Not on file  ?Stress: Not on file  ?Social Connections: Not on file  ?Intimate Partner Violence: Not on file  ? ?Family History  ?Problem Relation Age of Onset  ? Arthritis Mother   ? Cancer Maternal Grandmother   ? Heart disease Maternal Grandmother   ? Cancer Maternal Grandfather   ? Cancer Paternal Grandmother   ? Heart disease Paternal Grandfather   ? Arthritis Father   ? Hypertension Father   ? Atrial fibrillation Brother   ? ? ?Objective: ?Office vital signs reviewed. ?BP 124/76   Pulse 80   Temp 97.9 ?F (36.6 ?C)   Ht '5\' 8"'$  (1.727 m)   Wt 258 lb 6.4 oz (117.2 kg)   SpO2 96%   BMI 39.29 kg/m?  ? ?Physical Examination:  ?General: Awake, alert, well nourished, well-appearing female no acute distress ?HEENT: No exophthalmos, no conjunctival pallor ?Cardio: regular rate and rhythm, S1S2 heard, no murmurs appreciated ?Pulm: clear to auscultation bilaterally, no wheezes, rhonchi or rales; normal work of breathing on room air ?Neuro: No tremor ? ?Assessment/ Plan: ?56 y.o. female  ? ?Low serum iron - Plan: CBC, Iron, TIBC and Ferritin Panel ? ?Acquired hypothyroidism - Plan: TSH, T4, Free ? ?Fatigue, unspecified type - Plan: CBC, Iron, TIBC and Ferritin  Panel, TSH, T4, Free ? ?Recheck iron, TIBC, ferritin and CBC given known low iron in the past.  She has been twice daily taking her ferrous sulfate.  Anticipate normal iron level ? ?Check TSH, free T4, specifically given reports of fatigue in the last 2 weeks.  Uncertain etiology at this point we have adjusted some  of the thyroid medication so it was certainly may be from her thyroid being subtherapeutic. ? ?No orders of the defined types were placed in this encounter. ? ?No orders of the defined types were placed in this encounter. ? ? ? ?Janora Norlander, DO ?[place] ?[phone] ? ? ?

## 2021-09-04 ENCOUNTER — Other Ambulatory Visit: Payer: Self-pay | Admitting: Family Medicine

## 2021-09-04 DIAGNOSIS — E039 Hypothyroidism, unspecified: Secondary | ICD-10-CM

## 2021-09-04 LAB — IRON,TIBC AND FERRITIN PANEL
Ferritin: 87 ng/mL (ref 15–150)
Iron Saturation: 25 % (ref 15–55)
Iron: 71 ug/dL (ref 27–159)
Total Iron Binding Capacity: 289 ug/dL (ref 250–450)
UIBC: 218 ug/dL (ref 131–425)

## 2021-09-04 LAB — CBC
Hematocrit: 45.5 % (ref 34.0–46.6)
Hemoglobin: 15.5 g/dL (ref 11.1–15.9)
MCH: 28.5 pg (ref 26.6–33.0)
MCHC: 34.1 g/dL (ref 31.5–35.7)
MCV: 84 fL (ref 79–97)
Platelets: 200 10*3/uL (ref 150–450)
RBC: 5.43 x10E6/uL — ABNORMAL HIGH (ref 3.77–5.28)
RDW: 15.1 % (ref 11.7–15.4)
WBC: 5.9 10*3/uL (ref 3.4–10.8)

## 2021-09-04 LAB — T4, FREE: Free T4: 1.44 ng/dL (ref 0.82–1.77)

## 2021-09-04 LAB — TSH: TSH: 4.88 u[IU]/mL — ABNORMAL HIGH (ref 0.450–4.500)

## 2021-09-04 MED ORDER — LEVOTHYROXINE SODIUM 112 MCG PO TABS: 112.0000 ug | ORAL_TABLET | Freq: Every day | ORAL | 0 refills | Status: DC

## 2021-09-04 NOTE — Progress Notes (Signed)
? ?Office Visit Note ? ?Patient: Candace Rodriguez             ?Date of Birth: 11-Jun-1965           ?MRN: [id number]             ?PCP: Janora Norlander, DO ?Referring: Janora Norlander, DO ?Visit Date: 09/18/2021 ?Occupation: '@GUAROCC'$ @ ? ?Subjective:  ?Stiffness and joint discomfort ? ?History of Present Illness: Candace Rodriguez is a 56 y.o. female with history of osteoarthritis.  She states she fell about 3 months ago and fractured her left patella.  She was seen by Dr. Alvan Dame.  She states that she developed cellulitis after the fall.  The infection gradually resolved and the bruising improved.  She takes diclofenac 75 mg daily or twice daily prescribed by Dr. Alvan Dame.  She can use to have some stiffness in her hands.  She is very active and does different projects.  She has some lower back pain off-and-on.  She has been going to the chiropractor. ? ?Activities of Daily Living:  ?Patient reports morning stiffness for a few minutes.   ?Patient Denies nocturnal pain.  ?Difficulty dressing/grooming: Denies ?Difficulty climbing stairs: Denies ?Difficulty getting out of chair: Denies ?Difficulty using hands for taps, buttons, cutlery, and/or writing: Denies ? ?Review of Systems  ?Constitutional:  Positive for fatigue.  ?HENT:  Negative for mouth sores, mouth dryness and nose dryness.   ?Eyes:  Positive for pain, itching and dryness.  ?Respiratory:  Negative for shortness of breath and difficulty breathing.   ?Cardiovascular:  Negative for chest pain and palpitations.  ?Gastrointestinal:  Negative for blood in stool, constipation and diarrhea.  ?Endocrine: Negative for increased urination.  ?Genitourinary:  Negative for difficulty urinating.  ?Musculoskeletal:  Positive for joint pain, joint pain and morning stiffness. Negative for joint swelling, myalgias, muscle tenderness and myalgias.  ?Skin:  Negative for color change, rash and redness.  ?Allergic/Immunologic: Negative for susceptible to infections.   ?Neurological:  Positive for numbness. Negative for dizziness, headaches, memory loss and weakness.  ?Hematological:  Negative for bruising/bleeding tendency.  ?Psychiatric/Behavioral:  Negative for confusion.   ? ?PMFS History:  ?Patient Active Problem List  ? Diagnosis Date Noted  ? Primary osteoarthritis of both hands 08/16/2018  ? DDD (degenerative disc disease), lumbar 08/16/2018  ? History of restless legs syndrome 07/24/2016  ? History of migraine 07/15/2016  ? History of hypothyroidism 07/15/2016  ? History of partial knee replacement 07/15/2016  ? Hyperbilirubinemia 11/05/2015  ? Chronic venous insufficiency 10/15/2015  ? Healthcare maintenance 10/15/2015  ? OSA (obstructive sleep apnea) 10/25/2014  ? PCOS (polycystic ovarian syndrome) 06/12/2014  ? Peripheral edema 06/12/2014  ? Postprandial RUQ pain 12/07/2013  ? Asthma 11/16/2012  ? Morbid obesity with BMI of 50.0-59.9, adult ([place]) 11/16/2012  ? Dyslipidemia 09/13/2012  ? Essential hypertension, benign 09/13/2012  ? Hypothyroidism 09/13/2012  ? Restless leg syndrome, controlled 09/13/2012  ? Migraine headache 09/13/2012  ? Gout 09/13/2012  ?  ?Past Medical History:  ?Diagnosis Date  ? Asthma   ? Basal cell carcinoma 04/13/2019  ? left upper back-cx3 20f  ? Depression   ? DJD (degenerative joint disease)   ? Fractures   ? Left distal radius,  left patella  ? History of COVID-19 07/01/2019  ? Hyperlipidemia   ? Hypertension   ? Lyme disease   ? Migraine   ? Obesity   ? Obesity, morbid ([place] 09/13/2012  ? OSA (obstructive sleep apnea)   ? Dx  by Headache and [place]  ? Restless leg syndrome   ? SCC (squamous cell carcinoma) 04/13/2019  ? right side nose-cx67f  ? Sleep apnea   ? 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 left cheek-cx361f ? Thyroid disease   ? Tinnitus   ?  ?Family History  ?Problem Relation Age of Onset  ? Arthritis Mother   ? Arthritis Father   ? Hypertension Father   ? Atrial fibrillation Brother   ? Cancer Maternal  Grandmother   ? Heart disease Maternal Grandmother   ? Cancer Maternal Grandfather   ? Cancer Paternal Grandmother   ? Heart disease Paternal Grandfather   ? ?Past Surgical History:  ?Procedure Laterality Date  ? CHOLECYSTECTOMY  10/2013  ? JOINT REPLACEMENT Bilateral 10/2008  ?  knee each partial replacement  ? KNEE SURGERY    ? LAPAROSCOPIC ABDOMINAL EXPLORATION  06/1993  ? PARTIAL GASTRECTOMY  11/16/2012  ? VSG   ? WRIST SURGERY  09/1998  ? L  ? ?Social History  ? ?Social History Narrative  ? Not on file  ? ?Immunization History  ?Administered Date(s) Administered  ? Influenza,inj,Quad PF,6+ Mos 02/16/2017  ? Pneumococcal Polysaccharide-23 03/17/2006, 11/15/2013  ? Td 12/23/2007  ? Zoster Recombinat (Shingrix) 12/08/2016, 02/16/2017  ?  ? ?Objective: ?Vital Signs: BP 133/84 (BP Location: Left Arm, Patient Position: Sitting, Cuff Size: Large)   Pulse 80   Ht '5\' 8"'$  (1.727 m)   Wt 256 lb 6.4 oz (116.3 kg)   BMI 38.99 kg/m?   ? ?Physical Exam ?Vitals and nursing note reviewed.  ?Constitutional:   ?   Appearance: She is well-developed.  ?HENT:  ?   Head: Normocephalic and atraumatic.  ?Eyes:  ?   Conjunctiva/sclera: Conjunctivae normal.  ?Cardiovascular:  ?   Rate and Rhythm: Normal rate and regular rhythm.  ?   Heart sounds: Normal heart sounds.  ?Pulmonary:  ?   Effort: Pulmonary effort is normal.  ?   Breath sounds: Normal breath sounds.  ?Abdominal:  ?   General: Bowel sounds are normal.  ?   Palpations: Abdomen is soft.  ?Musculoskeletal:  ?   Cervical back: Normal range of motion.  ?Lymphadenopathy:  ?   Cervical: No cervical adenopathy.  ?Skin: ?   General: Skin is warm and dry.  ?   Capillary Refill: Capillary refill takes less than 2 seconds.  ?Neurological:  ?   Mental Status: She is alert and oriented to person, place, and time.  ?Psychiatric:     ?   Behavior: Behavior normal.  ?  ? ?Musculoskeletal Exam: C-spine was in good range of motion.  She had no discomfort range of motion of her lumbar spine.   Shoulder joints, elbow joints, wrist joints were in good range of motion.  She had bilateral PIP and DIP thickening with no synovitis.  Hip joints with good range of motion.  Bilateral knee joints were replaced.  There was no tenderness over ankles or MTPs. ? ?CDAI Exam: ?CDAI Score: -- ?Patient Global: --; Provider Global: -- ?Swollen: --; Tender: -- ?Joint Exam 09/18/2021  ? ?No joint exam has been documented for this visit  ? ?There is currently no information documented on the homunculus. Go to the Rheumatology activity and complete the homunculus joint exam. ? ?Investigation: ?No additional findings. ? ?Imaging: ?No results found. ? ?Recent Labs: ?Lab Results  ?Component Value Date  ? WBC 5.9 09/03/2021  ? HGB 15.5 09/03/2021  ? PLT 200 09/03/2021  ?  NA 143 07/18/2021  ? K 4.9 07/18/2021  ? CL 106 07/18/2021  ? CO2 24 07/18/2021  ? GLUCOSE 89 07/18/2021  ? BUN 11 07/18/2021  ? CREATININE 0.69 07/18/2021  ? BILITOT 0.9 07/18/2021  ? ALKPHOS 79 07/18/2021  ? AST 20 07/18/2021  ? ALT 30 07/18/2021  ? PROT 6.2 07/18/2021  ? ALBUMIN 4.3 07/18/2021  ? CALCIUM 9.9 07/18/2021  ? GFRAA 119 09/04/2017  ? ? ?Speciality Comments: No specialty comments available. ? ?Procedures:  ?No procedures performed ?Allergies: Avelox [moxifloxacin], Gluten meal, Other, Penicillin g, Penicillins, Quinolones, and Soy allergy  ? ?Assessment / Plan:     ?Visit Diagnoses: Primary osteoarthritis of both hands-she continues to have pain and stiffness in her hands.  No synovitis was noted.  She had bilateral PIP and DIP thickening.  Joint protection muscle strengthening was discussed. ? ?History of bilateral partial knee replacement-doing well.  She developed left patellar fracture after recent fall which is gradually healing.  She has been taking diclofenac 75 mg twice daily.  Her labs are monitored by her PCP. ?DDD (degenerative disc disease), lumbar-she goes to the chiropractor off-and-on. ? ?Idiopathic chronic gout of multiple sites  without tophus-she has not had a gout flare in several years.  Uric acid has been in desirable range.  I advised her to contact me in case she develops any increased swelling. ? ?Osteopenia of multiple sites-patient states she

## 2021-09-05 ENCOUNTER — Other Ambulatory Visit: Payer: Self-pay | Admitting: Family Medicine

## 2021-09-05 DIAGNOSIS — G2581 Restless legs syndrome: Secondary | ICD-10-CM

## 2021-09-18 ENCOUNTER — Encounter: Payer: Self-pay | Admitting: Rheumatology

## 2021-09-18 ENCOUNTER — Ambulatory Visit: Payer: BC Managed Care – PPO | Admitting: Rheumatology

## 2021-09-18 VITALS — BP 133/84 | HR 80 | Ht 68.0 in | Wt 256.4 lb

## 2021-09-18 DIAGNOSIS — M1A09X0 Idiopathic chronic gout, multiple sites, without tophus (tophi): Secondary | ICD-10-CM

## 2021-09-18 DIAGNOSIS — M19042 Primary osteoarthritis, left hand: Secondary | ICD-10-CM

## 2021-09-18 DIAGNOSIS — Z8639 Personal history of other endocrine, nutritional and metabolic disease: Secondary | ICD-10-CM

## 2021-09-18 DIAGNOSIS — M79672 Pain in left foot: Secondary | ICD-10-CM

## 2021-09-18 DIAGNOSIS — Z96659 Presence of unspecified artificial knee joint: Secondary | ICD-10-CM | POA: Diagnosis not present

## 2021-09-18 DIAGNOSIS — M19041 Primary osteoarthritis, right hand: Secondary | ICD-10-CM

## 2021-09-18 DIAGNOSIS — M5136 Other intervertebral disc degeneration, lumbar region: Secondary | ICD-10-CM

## 2021-09-18 DIAGNOSIS — G4733 Obstructive sleep apnea (adult) (pediatric): Secondary | ICD-10-CM

## 2021-09-18 DIAGNOSIS — Z8669 Personal history of other diseases of the nervous system and sense organs: Secondary | ICD-10-CM

## 2021-09-18 DIAGNOSIS — I10 Essential (primary) hypertension: Secondary | ICD-10-CM

## 2021-09-18 DIAGNOSIS — M8589 Other specified disorders of bone density and structure, multiple sites: Secondary | ICD-10-CM

## 2021-09-18 DIAGNOSIS — M51369 Other intervertebral disc degeneration, lumbar region without mention of lumbar back pain or lower extremity pain: Secondary | ICD-10-CM

## 2021-09-18 DIAGNOSIS — E785 Hyperlipidemia, unspecified: Secondary | ICD-10-CM

## 2021-09-18 DIAGNOSIS — M25511 Pain in right shoulder: Secondary | ICD-10-CM

## 2021-10-07 ENCOUNTER — Other Ambulatory Visit: Payer: BC Managed Care – PPO

## 2021-10-07 DIAGNOSIS — E039 Hypothyroidism, unspecified: Secondary | ICD-10-CM

## 2021-10-08 LAB — T4, FREE: Free T4: 1.66 ng/dL (ref 0.82–1.77)

## 2021-10-08 LAB — TSH: TSH: 4.94 u[IU]/mL — ABNORMAL HIGH (ref 0.450–4.500)

## 2021-10-08 MED ORDER — LEVOTHYROXINE SODIUM 125 MCG PO TABS: ORAL_TABLET | ORAL | 3 refills | Status: DC

## 2021-11-13 ENCOUNTER — Other Ambulatory Visit: Payer: BC Managed Care – PPO

## 2021-11-13 ENCOUNTER — Other Ambulatory Visit: Payer: Self-pay

## 2021-11-13 DIAGNOSIS — E039 Hypothyroidism, unspecified: Secondary | ICD-10-CM

## 2021-11-14 LAB — TSH: TSH: 4.29 u[IU]/mL (ref 0.450–4.500)

## 2021-11-14 LAB — T4, FREE: Free T4: 1.41 ng/dL (ref 0.82–1.77)

## 2021-12-05 ENCOUNTER — Encounter: Payer: Self-pay | Admitting: Cardiology

## 2021-12-05 ENCOUNTER — Ambulatory Visit: Payer: BC Managed Care – PPO | Admitting: Cardiology

## 2021-12-05 VITALS — BP 134/90 | HR 81 | Ht 68.0 in | Wt 242.0 lb

## 2021-12-05 DIAGNOSIS — I471 Supraventricular tachycardia: Secondary | ICD-10-CM

## 2021-12-05 DIAGNOSIS — I10 Essential (primary) hypertension: Secondary | ICD-10-CM | POA: Diagnosis not present

## 2021-12-05 DIAGNOSIS — G4733 Obstructive sleep apnea (adult) (pediatric): Secondary | ICD-10-CM | POA: Diagnosis not present

## 2021-12-05 DIAGNOSIS — R0602 Shortness of breath: Secondary | ICD-10-CM | POA: Diagnosis not present

## 2021-12-05 NOTE — Patient Instructions (Signed)
Medication Instructions:  Your physician recommends that you continue on your current medications as directed. Please refer to the Current Medication list given to you today.   Labwork: None  Testing/Procedures: None  Follow-Up: Follow up with Dr. Radford Pax in 1 year   Any Other Special Instructions Will Be Listed Below (If Applicable).     If you need a refill on your cardiac medications before your next appointment, please call your pharmacy.

## 2021-12-05 NOTE — Progress Notes (Signed)
Cardiology Note    Date:  12/05/2021   ID:  STALEY BUDZINSKI, DOB [birth year], MRN [id number]  PCP:  Janora Norlander, DO  Cardiologist:  Fransico Him, MD   Chief Complaint  Patient presents with   Follow-up    Paroxysmal atrial tachycardia, shortness of breath, hypertension    History of Present Illness:  ALISSANDRA GEOFFROY is a 56 y.o. female  with a hx of asthma, depression, HTN, HLD, morbid obesity, RLS, OSA who is referred for evaluation of tachycardia. She was seen by her PCP and complained of a several month hx of palpitations that she describes as a fluttering sensation in her chest.  Her heart rate will get as fast as 130-140bpm and last several hours.   She occasionally has some mild LE edema.  She has an extensive fm hx of atrial fibrillation.  She also has a fm hx of SCD.  She had a Maternal uncle (age 15's) and first cousin (age 28's) who had SCD.   She has a hx of OSA dx in 2009 and started on CPAP but has not been on CPAP in years.  She had weight loss surgery and lost down to 180lbs and then gained back weight but has lost 40lbs again since then beginning of the year. Her last sleep study was in 2016 that showed no OSA.  Labs in PCP office last month were normal including CMET, CBC and thyroid.  She does not drink any ETOH and only drinks 30oz of coffee daily and has cut that down to 2 cups in the am.   She wore an event monitor which showed several episodes of nonsustained atrial tachycardia.  She was started on Toprol-XL 25 mg daily.  2D echo showed normal LV function with EF 55 to 60%.  TSH was normal.  She was seen back in January by an extender and palpitations had improved but were still having some so her Toprol-XL was increased to 50 mg daily.  She is here today for followup and is doing well.  She denies any chest pain or pressure, SOB, DOE, PND, orthopnea, dizziness, palpitations or syncope. Occasionally she will have some LLE after a knee injury earlier this  year. She is compliant with her meds and is tolerating meds with no SE.      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   Calcium 200 MG TABS Take by mouth 2 (two) times daily.    Cats Claw, Uncaria tomentosa, (CATS CLAW PO) Take 500 mg by mouth 3 (three) times daily.   Cholecalciferol (VITAMIN D3 PO) Take 20,000 mg by mouth daily.   diclofenac (VOLTAREN) 75 MG EC tablet Take 75 mg by mouth 2 (two) times daily.   diphenhydrAMINE HCl (BENADRYL ALLERGY PO) Take by mouth as needed.   Iron, Ferrous Sulfate, 325 (65 Fe) MG TABS Take 1 tablet by mouth in the morning and at bedtime.   levothyroxine (SYNTHROID) 112 MCG tablet Take 1 tablet (112 mcg total) by mouth daily before breakfast.   levothyroxine (SYNTHROID) 125 MCG tablet Take 144mg on Saturday and Sundays.  Continue 112 mcg the remainder of the week.    Magnesium Malate POWD 1,500 mg by Does not apply route.   Melatonin 10 MG TABS Take by mouth daily.   Methylcobalamin 50000 MCG SOLR Inject as directed every 7 (seven) days.   metoprolol succinate (TOPROL XL) 50 MG 24 hr tablet Take 1 tablet (50 mg total) by mouth daily.   Multiple Vitamin (MULTIVITAMIN) tablet Take 1 tablet by mouth 3 (three) times daily. Celebrate Bariatric   OVER THE COUNTER MEDICATION 120 mg 2 (two) times daily. Ginko Biloba   OVER THE COUNTER MEDICATION 240 mg daily. pine bark extract   Probiotic Product (PROBIOTIC PO) Take by mouth daily.   rOPINIRole (REQUIP) 1 MG tablet Take 2 tablets (2 mg total) by mouth at bedtime.   Semaglutide (OZEMPIC, 1 MG/DOSE, [place]) Inject into the skin.   testosterone enanthate (DELATESTRYL) 200 MG/ML injection Inject into the muscle every 7 (seven) days. For IM use only   [DISCONTINUED] clindamycin (CLEOCIN) 300 MG capsule Take 2 capsules by mouth as needed.    Allergies:   Avelox [moxifloxacin], Gluten meal, Other, Penicillin g, Penicillins, Quinolones, and Soy allergy   Social History   Socioeconomic History   Marital status: Married    Spouse name: Not on file   Number of children: Not on file   Years of education: Not on file   Highest education level: Not on file  Occupational History   Occupation: STheme park managerand [employer] Tobacco Use   Smoking status: Former    Packs/day: 2.00    Years: 10.00    Total pack years: 20.00    Types: Cigarettes    Quit date: 05/26/1998    Years since quitting: 23.5   Smokeless tobacco: Never  Vaping Use   Vaping Use: Never used  Substance and Sexual Activity   Alcohol use: No    Alcohol/week: 0.0 standard drinks of alcohol   Drug use: No   Sexual activity: Not on file  Other Topics Concern   Not on file  Social History Narrative   Not on file   Social Determinants of Health   Financial Resource Strain: Not on file  Food Insecurity: Not on file  Transportation Needs: Not on  file  Physical Activity: Not on file  Stress: Not on file  Social Connections: Not on file     Family History:  The patient's family history includes Arthritis in her father and mother; Atrial fibrillation in her brother; Cancer in her maternal grandfather, maternal grandmother, and paternal grandmother; Heart disease in  her maternal grandmother and paternal grandfather; Hypertension in her father.   ROS:   Please see the history of present illness.    ROS All other systems reviewed and are negative.      No data to display          PHYSICAL EXAM:   VS:  BP 134/90   Pulse 81   Ht '5\' 8"'$  (1.727 m)   Wt 242 lb (109.8 kg)   SpO2 97%   BMI 36.80 kg/m    GEN: Well nourished, well developed in no acute distress HEENT: Normal NECK: No JVD; No carotid bruits LYMPHATICS: No lymphadenopathy CARDIAC:RRR, no murmurs, rubs, gallops RESPIRATORY:  Clear to auscultation without rales, wheezing or rhonchi  ABDOMEN: Soft, non-tender, non-distended MUSCULOSKELETAL:  No edema; No deformity  SKIN: Warm and dry NEUROLOGIC:  Alert and oriented x 3 PSYCHIATRIC:  Normal affect   Wt Readings from Last 3 Encounters:  12/05/21 242 lb (109.8 kg)  09/18/21 256 lb 6.4 oz (116.3 kg)  09/03/21 258 lb 6.4 oz (117.2 kg)      Studies/Labs Reviewed:   EKG: No EKG was performed today  Recent Labs: 07/18/2021: ALT 30; BUN 11; Creatinine, Ser 0.69; Potassium 4.9; Sodium 143 09/03/2021: Hemoglobin 15.5; Platelets 200 11/13/2021: TSH 4.290   Lipid Panel    Component Value Date/Time   CHOL 223 (H) 07/18/2021 0803   TRIG 105 07/18/2021 0803   TRIG 79 11/07/2013 0906   HDL 67 07/18/2021 0803   HDL 51 11/07/2013 0906   CHOLHDL 3.3 07/18/2021 0803   LDLCALC 138 (H) 07/18/2021 0803   LDLCALC 110 (H) 11/07/2013 0906    Additional studies/ records that were reviewed today include:  OV notes and labs from PCP    ASSESSMENT:    1. PAT (paroxysmal atrial tachycardia) ([place])   2. SOB (shortness of  breath)   3. OSA (obstructive sleep apnea)   4. Essential hypertension, benign      PLAN:  In order of problems listed above:  Paroxysmal atrial tachycardia -Palpitations have significantly improved on beta-blocker therapy -Continue prescription drug management Toprol-XL 50 mg daily with as needed refills  2.  SOB -this occurs mainly with palpitations and has resolved -2D echo showed normal LV function -TSH was normal  3.  OSA -she was dx with OSA years ago and has not been on CPAP in some time -her last sleep study in 2016 showed no OSA after weight loss  4. HTN -BP is adequately controlled on exam today -Continue prescription drug management with Toprol-XL 50 mg daily with as needed refills   Time Spent: 25 minutes total time of encounter, including 15 minutes spent in face-to-face patient care on the date of this encounter. This time includes coordination of care and counseling regarding above mentioned problem list. Remainder of non-face-to-face time involved reviewing chart documents/testing relevant to the patient encounter and documentation in the medical record. I have independently reviewed documentation from referring provider  Medication Adjustments/Labs and Tests Ordered: Current medicines are reviewed at length with the patient today.  Concerns regarding medicines are outlined above.  Medication changes, Labs and Tests ordered today are listed in the Patient Instructions below.  There are no Patient Instructions on file for this visit.   Signed, Fransico Him, MD  12/05/2021 1:17 PM    [facility] [place], [place], [place]  [address] Phone: [phone]; Fax: [phone]

## 2021-12-19 ENCOUNTER — Other Ambulatory Visit: Payer: Self-pay | Admitting: Family Medicine

## 2021-12-19 DIAGNOSIS — E039 Hypothyroidism, unspecified: Secondary | ICD-10-CM

## 2022-01-03 ENCOUNTER — Ambulatory Visit: Payer: BC Managed Care – PPO | Admitting: Family Medicine

## 2022-01-03 ENCOUNTER — Encounter: Payer: Self-pay | Admitting: Family Medicine

## 2022-01-03 VITALS — BP 121/78 | HR 80 | Temp 97.4°F | Ht 68.0 in | Wt 234.8 lb

## 2022-01-03 DIAGNOSIS — F320 Major depressive disorder, single episode, mild: Secondary | ICD-10-CM

## 2022-01-03 DIAGNOSIS — G2581 Restless legs syndrome: Secondary | ICD-10-CM | POA: Diagnosis not present

## 2022-01-03 DIAGNOSIS — L298 Other pruritus: Secondary | ICD-10-CM | POA: Diagnosis not present

## 2022-01-03 DIAGNOSIS — E039 Hypothyroidism, unspecified: Secondary | ICD-10-CM

## 2022-01-03 DIAGNOSIS — L989 Disorder of the skin and subcutaneous tissue, unspecified: Secondary | ICD-10-CM

## 2022-01-03 DIAGNOSIS — I471 Supraventricular tachycardia: Secondary | ICD-10-CM | POA: Diagnosis not present

## 2022-01-03 MED ORDER — CLOBETASOL PROPIONATE 0.05 % EX SOLN: 1.0000 | Freq: Every day | CUTANEOUS | 0 refills | Status: AC | PRN

## 2022-01-03 MED ORDER — BUPROPION HCL ER (XL) 150 MG PO TB24: 150.0000 mg | ORAL_TABLET | Freq: Every morning | ORAL | 3 refills | Status: DC

## 2022-01-03 NOTE — Progress Notes (Signed)
Subjective: CC: Follow-up thyroid PCP: Janora Norlander, DO Candace Rodriguez is a 56 y.o. female presenting to clinic today for:  1.  Hypothyroidism Patient had labs done in June which showed normal thyroid levels on current regimen.  No changes were made.  She notes that T3 was added by her specialist in efforts to improve energy but she has noticed no improvement over the last 3 weeks.  She is due for thyroid labs with them in 3 weeks.  Has ongoing intermittent heart palpitations.  She notes her heart rate really never goes below resting heart rate when she sleeps and feels that this impacts her sleep quality.  She continues to suffer from restless leg syndrome and she notes that this is very debilitating sometimes.  She never spends any substantial amount of time outside of the home because she worries about flareups.  She is interested in proceeding with referral to the movement specialist  2.  Skin concerns Patient reports that she has been having irritated, even sometimes painful scalp.  She was seen by her hairdresser recently and photographs were taken.  She brings this to me.  She started using Nizoral shampoo OTC and that has helped with some of the tenderness of the scalp but she continues to have irritation and lesions popping up on the scalp.  Additionally, she has a lesion on the left cheek that previously required treatment by her dermatologist.  Fortunately her dermatologist has closed and she is asking for new referral/recommendations for an alternative  3. ?  Depression Patient denies feeling down or depressed but notes that she is pretty unmotivated and indifferent towards things that used to bring her happiness.  She cites that she has gotten rid of all of her alpacas and only takes care of a few bonsai trees now.  She used to weave every single day and now she has 3 looms that she has started in her home but they have not been touched for over a year.  Her friend  wonders if perhaps she has an unrecognized depression.  Patient watched a commercial recently for some antidepressant that was being advertised and she fits a lot of the criteria so wanted to discuss this with me more thoroughly.  She has been treated for anxiety/depression in the past with Zoloft.   ROS: Per HPI  Allergies  Allergen Reactions   Avelox [Moxifloxacin]    Gluten Meal Other (See Comments)    Joint Pain   Other Other (See Comments)    Dairy intolerance   Penicillin G Itching and Other (See Comments)    Turns red in color.   Penicillins    Quinolones Other (See Comments)    Pt can't remeber   Soy Allergy Rash    Rash in mouth.   Past Medical History:  Diagnosis Date   Asthma    Basal cell carcinoma 04/13/2019   left upper back-cx3 55f   Depression    DJD (degenerative joint disease)    Fractures    Left distal radius,  left patella   History of COVID-19 07/01/2019   Hyperlipidemia    Hypertension    Lyme disease    Migraine    Obesity    [place]   OSA (obstructive sleep apnea)    [facility]   Restless leg syndrome    SCC (squamous cell carcinoma) 04/13/2019   right side nose-[id number]  Sleep apnea    Squamous  cell carcinoma in situ (SCCIS) 04/13/2019   left cheek-cx9f   Thyroid disease    Tinnitus     Current Outpatient Medications:    albuterol (VENTOLIN HFA) 108 (90 Base) MCG/ACT inhaler, Inhale 2 puffs into the lungs every 6 (six) hours as needed for wheezing or shortness of breath., Disp: 18 g, Rfl: 11   Calcium 200 MG TABS, Take by mouth 2 (two) times daily. , Disp: , Rfl:    Cats Claw, Uncaria tomentosa, (CATS CLAW PO), Take 500 mg by mouth 3 (three) times daily., Disp: , Rfl:    Cholecalciferol (VITAMIN D3 PO), Take 20,000 mg by mouth daily., Disp: , Rfl:    diclofenac (VOLTAREN) 75 MG EC tablet, Take 75 mg by mouth 2 (two) times daily., Disp: , Rfl:    diphenhydrAMINE HCl (BENADRYL ALLERGY PO),  Take by mouth as needed., Disp: , Rfl:    Iron, Ferrous Sulfate, 325 (65 Fe) MG TABS, Take 1 tablet by mouth in the morning and at bedtime., Disp: 180 tablet, Rfl: 1   levothyroxine (SYNTHROID) 112 MCG tablet, TAKE 1 TABLET BY MOUTH EVERY MORNING BEFORE BREAKFAST, Disp: 90 tablet, Rfl: 0   levothyroxine (SYNTHROID) 125 MCG tablet, Take 1255m on Saturday and Sundays.  Continue 112 mcg the remainder of the week., Disp: 24 tablet, Rfl: 3   Magnesium Malate POWD, 1,500 mg by Does not apply route., Disp: , Rfl:    Melatonin 10 MG TABS, Take by mouth daily., Disp: , Rfl:    Methylcobalamin 50000 MCG SOLR, Inject as directed every 7 (seven) days., Disp: , Rfl:    metoprolol succinate (TOPROL XL) 50 MG 24 hr tablet, Take 1 tablet (50 mg total) by mouth daily., Disp: 90 tablet, Rfl: 3   Multiple Vitamin (MULTIVITAMIN) tablet, Take 1 tablet by mouth 3 (three) times daily. Celebrate Bariatric, Disp: , Rfl:    OVER THE COUNTER MEDICATION, 120 mg 2 (two) times daily. Ginko Biloba, Disp: , Rfl:    OVER THE COUNTER MEDICATION, 240 mg daily. pine bark extract, Disp: , Rfl:    Probiotic Product (PROBIOTIC PO), Take by mouth daily., Disp: , Rfl:    rOPINIRole (REQUIP) 1 MG tablet, Take 2 tablets (2 mg total) by mouth at bedtime., Disp: 180 tablet, Rfl: 3   Semaglutide (OZEMPIC, 1 MG/DOSE, [place]), Inject into the skin., Disp: , Rfl:    testosterone enanthate (DELATESTRYL) 200 MG/ML injection, Inject into the muscle every 7 (seven) days. For IM use only, Disp: , Rfl:  Social History   Socioeconomic History   Marital status: Married    Spouse name: Not on file   Number of children: Not on file   Years of education: Not on file   Highest education level: Not on file  Occupational History   Occupation: SpTheme park managernd [place]   Smoking status: Former    Packs/day: 2.00    Years: 10.00    Total pack years: 20.00    Types: Cigarettes    Quit date: 05/26/1998    Years since quitting: 23.6    Smokeless tobacco: Never  Vaping Use   Vaping Use: Never used  Substance and Sexual Activity   Alcohol use: No    Alcohol/week: 0.0 standard drinks of alcohol   Drug use: No   Sexual activity: Not on file  Other Topics Concern   Not on file  Social History Narrative   Not on file   Social Determinants of Health   Financial Resource Strain:  Not on file  Food Insecurity: Not on file  Transportation Needs: Not on file  Physical Activity: Not on file  Stress: Not on file  Social Connections: Not on file  Intimate Partner Violence: Not on file   Family History  Problem Relation Age of Onset   Arthritis Mother    Arthritis Father    Hypertension Father    Atrial fibrillation Brother    Cancer Maternal Grandmother    Heart disease Maternal Grandmother    Cancer Maternal Grandfather    Cancer Paternal Grandmother    Heart disease Paternal Grandfather     Objective: Office vital signs reviewed. BP 121/78   Pulse 80   Temp (!) 97.4 F (36.3 C)   Ht '5\' 8"'$  (1.727 m)   Wt 234 lb 12.8 oz (106.5 kg)   SpO2 98%   BMI 35.70 kg/m   Physical Examination:  General: Awake, alert, well nourished, No acute distress HEENT: No exophthalmos.  Sclera is white. Cardio: regular rate and rhythm  Pulm: Normal work of breathing on room air MSK: Ambulating independently Neuro: No tremor or abnormal movements noted while seated Skin: Macular lesion along the left cheek with pigment noted  Assessment/ Plan: 56 y.o. female   Acquired hypothyroidism  Paroxysmal atrial tachycardia (HCC)  Restless leg syndrome - Plan: Ambulatory referral to Neurology  Pruritic dermatosis of scalp - Plan: clobetasol (TEMOVATE) 0.05 % external solution  Depression, major, single episode, mild ([place]) - Plan: buPROPion (WELLBUTRIN XL) 150 MG 24 hr tablet  Skin lesion of cheek  Recently T3 added by her other specialist.  They will be collecting thyroid labs in the next few weeks and I asked that this be  sent to me.  Still having some breakthrough paroxysmal atrial tachycardia with recent appointment with cardiology.  No changes have been made but we discussed that since we are adding Wellbutrin she may notice that the concentration of metoprolol is a little more and subsequently may have less heart palpitations.  Referral to neurology, Dr. Carles Collet, placed for ongoing restless leg syndrome despite treatment with Requip and carbamazepine.    Uncertain if she has a psoriatic process but given the visible irritation of the scalp despite antifungal shampoo I have given clobetasol for as needed use  She essentially is experiencing anhedonia.  Trial of Wellbutrin.  Reassess in 4 to 6 weeks, sooner if concerns arise  Plan to refer to new dermatologist as her current one has closed.  She will let me know which dermatologist she would like a referral to  No orders of the defined types were placed in this encounter.  No orders of the defined types were placed in this encounter.    Janora Norlander, DO [place] [phone]

## 2022-01-03 NOTE — Patient Instructions (Signed)
Contact me in 4 weeks regarding Wellbutrin. If going well, will continue to refill. If not going well (needs adjustment of dose or switch to alternative), call for virtual visit Referral to Derm pending your selection  Referral to Neuro placed to Dr Tat Clobetasol applied to scalp as needed for irritation Please have your repeat thyroid labs sent to me

## 2022-01-06 ENCOUNTER — Encounter: Payer: Self-pay | Admitting: Neurology

## 2022-02-12 NOTE — Progress Notes (Signed)
Assessment/Plan:    RLS We discussed the phenomenon of augmentation, seen commonly with the dopamine agonists.  I am going to change her from Requip to Neupro, even though they are both agonist drugs.  I think she will get less augmentation and she will get 24-hour coverage with Neupro.  However, I discussed with her that we do not want to keep titrating up the dosage.  If she needs more, then we could  add something like gabapentin.  In addition, nonpharmacologic things such as weighted blankets could be beneficial when she is laying down in the evening.  Samples of Neupro, 1 mg were given.  She will let me know how she does with this.  Discussed risk, benefits, side effects, including but not limited to skin reaction.  Discussed that augmentation can happen with this drug as well. I did tell her that she could continue levodopa, but I really want her to only use this as needed, as opposed to scheduled.  In addition, I changed her from the 10/100 to carbidopa/levodopa 25/100.  Most of [place] believe that the carbidopa/levodopa 10/100 should not even be on the market, as it does not get into the CNS well.  Discussed with her that this drug can cause augmentation as well. If she is happy with the above regimen, I told her I would like to see her back at least 1 time and then she can follow-up with primary care.  Subjective:   Candace Rodriguez was seen today in neurologic consultation at the request of Janora Norlander, DO.  The consultation is for the evaluation of restless leg.  Medical records made available to me are reviewed  The patient is a 56 y.o. year old female with a history of RLS.  The first symptom began many years ago, in about 2004.  Symptoms consist of having to move the legs at night and having to move the legs.  Sx's have increased in intensity.  She started on the 1 mg.  Patient is currently on ropinirole, 2 mg at bedtime, but sometimes takes '3mg'$ . She was started on  carbidopa/levodopa 10/100 1-3 times daily prn.  She generally takes it 1 time per day and rarely 3 times per day.  She states that the time of day she takes it is usually after lunch, but it would depend on time she sat down to do paperwork.        ALLERGIES:   Allergies  Allergen Reactions   Avelox [Moxifloxacin]    Gluten Meal Other (See Comments)    Joint Pain   Other Other (See Comments)    Dairy intolerance   Penicillin G Itching and Other (See Comments)    Turns red in color.   Penicillins    Quinolones Other (See Comments)    Pt can't remeber   Soy Allergy Rash    Rash in mouth.    CURRENT MEDICATIONS:  Outpatient Encounter Medications as of 02/14/2022  Medication Sig   albuterol (VENTOLIN HFA) 108 (90 Base) MCG/ACT inhaler Inhale 2 puffs into the lungs every 6 (six) hours as needed for wheezing or shortness of breath.   buPROPion (WELLBUTRIN XL) 150 MG 24 hr tablet Take 1 tablet (150 mg total) by mouth in the morning.   Calcium 200 MG TABS Take by mouth 2 (two) times daily.    carbidopa-levodopa (SINEMET IR) 25-100 MG tablet Take 1 tablet by mouth daily as needed.   Cholecalciferol (VITAMIN D3 PO) Take 20,000 mg by  mouth daily.   clobetasol (TEMOVATE) 0.05 % external solution Apply 1 Application topically daily as needed (dermatitis of scalp).   diclofenac (VOLTAREN) 75 MG EC tablet Take 75 mg by mouth 2 (two) times daily.   diphenhydrAMINE HCl (BENADRYL ALLERGY PO) Take by mouth as needed.   Iron, Ferrous Sulfate, 325 (65 Fe) MG TABS Take 1 tablet by mouth in the morning and at bedtime.   levothyroxine (SYNTHROID) 112 MCG tablet TAKE 1 TABLET BY MOUTH EVERY MORNING BEFORE BREAKFAST   levothyroxine (SYNTHROID) 125 MCG tablet Take 157mg on Saturday and Sundays.  Continue 112 mcg the remainder of the week.   liothyronine (CYTOMEL) 25 MCG tablet Take by mouth daily. Half in morning half in afternoon   Magnesium Malate POWD 1,500 mg by Does not apply route.   Melatonin 10 MG  TABS Take by mouth daily.   Methylcobalamin 50000 MCG SOLR Inject as directed every 7 (seven) days.   metoprolol succinate (TOPROL XL) 50 MG 24 hr tablet Take 1 tablet (50 mg total) by mouth daily.   Multiple Vitamin (MULTIVITAMIN) tablet Take 1 tablet by mouth 3 (three) times daily. Celebrate Bariatric   OVER THE COUNTER MEDICATION 240 mg daily. pine bark extract   Probiotic Product (PROBIOTIC PO) Take by mouth daily.   testosterone enanthate (DELATESTRYL) 200 MG/ML injection Inject into the muscle every 7 (seven) days. For IM use only   [DISCONTINUED] carbidopa-levodopa (SINEMET IR) 10-100 MG tablet Take 1 tablet by mouth daily.   [DISCONTINUED] Cats Claw, Uncaria tomentosa, (CATS CLAW PO) Take 500 mg by mouth 3 (three) times daily.   [DISCONTINUED] rOPINIRole (REQUIP) 1 MG tablet Take 2 tablets (2 mg total) by mouth at bedtime.   [DISCONTINUED] OVER THE COUNTER MEDICATION 120 mg 2 (two) times daily. Ginko Biloba (Patient not taking: Reported on 02/14/2022)   [DISCONTINUED] Semaglutide (OZEMPIC, 1 MG/DOSE, [place]) Inject into the skin.   No facility-administered encounter medications on file as of 02/14/2022.    Objective:   PHYSICAL EXAMINATION:    VITALS:   Vitals:   02/14/22 1046  BP: (!) 147/88  Pulse: 65  SpO2: 97%  Weight: 223 lb (101.2 kg)  Height: '5\' 8"'$  (1.727 m)    GEN:  Normal appears female in no acute distress.  Appears stated age. HEENT:  Normocephalic, atraumatic. The mucous membranes are moist. The superficial temporal arteries are without ropiness or tenderness. Cardiovascular: Regular rate and rhythm. Lungs: Clear to auscultation bilaterally. Neck/Heme: There are no carotid bruits noted bilaterally.  NEUROLOGICAL: Orientation:  The patient is alert and oriented x 3.   Cranial nerves: There is good facial symmetry.  Extraocular muscles are intact and visual fields are full to confrontational testing. Speech is fluent and clear. Soft palate rises symmetrically and  there is no tongue deviation. Hearing is intact to conversational tone. Tone: Tone is good throughout. Sensation: Sensation is intact to light touch and pinprick throughout (facial, trunk, extremities). Vibration is intact at the bilateral big toe. There is no extinction with double simultaneous stimulation. There is no sensory dermatomal level identified. Coordination:  The patient has no difficulty with RAM's or FNF bilaterally. Motor: Strength is 5/5 in the bilateral upper and lower extremities.  Shoulder shrug is equal and symmetric. There is no pronator drift.  There are no fasciculations noted. DTR's: Deep tendon reflexes are 2-/4 at the bilateral biceps, triceps, brachioradialis, patella and achilles.  Plantar responses are downgoing bilaterally. Gait and Station: The patient is able to ambulate without difficulty. The  patient is able to heel toe walk without any difficulty. The patient is able to ambulate in a tandem fashion. The patient is able to stand in the Romberg position.    Total time spent on today's visit was 45 minutes, including both face-to-face time and nonface-to-face time.  Time included that spent on review of records (prior notes available to me/labs/imaging if pertinent), discussing treatment and goals, answering patient's questions and coordinating care.   Cc:  Janora Norlander, DO

## 2022-02-14 ENCOUNTER — Ambulatory Visit: Payer: BC Managed Care – PPO | Admitting: Neurology

## 2022-02-14 ENCOUNTER — Encounter: Payer: Self-pay | Admitting: Neurology

## 2022-02-14 ENCOUNTER — Other Ambulatory Visit: Payer: Self-pay

## 2022-02-14 VITALS — BP 147/88 | HR 65 | Ht 68.0 in | Wt 223.0 lb

## 2022-02-14 DIAGNOSIS — G2581 Restless legs syndrome: Secondary | ICD-10-CM | POA: Diagnosis not present

## 2022-02-14 MED ORDER — ROTIGOTINE 1 MG/24HR TD PT24: MEDICATED_PATCH | TRANSDERMAL | 0 refills | Status: DC

## 2022-02-14 MED ORDER — CARBIDOPA-LEVODOPA 25-100 MG PO TABS: 1.0000 | ORAL_TABLET | Freq: Every day | ORAL | 0 refills | Status: DC | PRN

## 2022-02-14 NOTE — Patient Instructions (Addendum)
STOP requip (ropinirole) STOP the carbidopa/levodopa 10/100 START neupro 1 mg daily.  Rotate sites daily You may take carbidopa/levodopa 25/100 ONLY AS NEEDED Try weighted blanket  The physicians and staff at [facility] are committed to providing excellent care. You may receive a survey requesting feedback about your experience at our office. We strive to receive "very good" responses to the survey questions. If you feel that your experience would prevent you from giving the office a "very good " response, please contact our office to try to remedy the situation. We may be reached at [phone]. Thank you for taking the time out of your busy day to complete the survey.

## 2022-02-28 ENCOUNTER — Encounter: Payer: Self-pay | Admitting: Neurology

## 2022-03-05 ENCOUNTER — Encounter: Payer: Self-pay | Admitting: Family Medicine

## 2022-03-05 ENCOUNTER — Ambulatory Visit: Payer: BC Managed Care – PPO | Admitting: Family Medicine

## 2022-03-05 VITALS — BP 111/71 | HR 81 | Temp 97.9°F | Ht 68.0 in | Wt 220.4 lb

## 2022-03-05 DIAGNOSIS — G2581 Restless legs syndrome: Secondary | ICD-10-CM

## 2022-03-05 DIAGNOSIS — K5909 Other constipation: Secondary | ICD-10-CM | POA: Diagnosis not present

## 2022-03-05 DIAGNOSIS — F320 Major depressive disorder, single episode, mild: Secondary | ICD-10-CM

## 2022-03-05 MED ORDER — VORTIOXETINE HBR 5 MG PO TABS: 5.0000 mg | ORAL_TABLET | Freq: Every day | ORAL | 1 refills | Status: DC

## 2022-03-05 NOTE — Patient Instructions (Signed)
STOP wellbutrin START Trintellix  Thank you for coming in to clinic today. Mix 17g or 1 capful daily, may adjust dose up or down by half a capful every few days. Recommend to take this medicine daily for next 1-2 weeks, then may need to use it longer if needed. - Goal is to have soft regular bowel movement 1-3x daily, if too runny or diarrhea, then reduce dose of the medicine  Improve water intake, hydration will help Also recommend increased vegetables, fruits, fiber intake Can try daily Metamucil or Fiber supplement at pharmacy over the counter  Follow-up if symptoms are not improving with bowel movements, or if pain worsens, develop fevers, nausea, vomiting. If you have any other questions or concerns, please feel free to call the clinic to contact me. You may also schedule an earlier appointment if necessary.  However, if your symptoms get significantly worse, please go to the Emergency Department to seek immediate medical attention.

## 2022-03-05 NOTE — Progress Notes (Signed)
Subjective: CC: Follow-up depression PCP: Candace Norlander, DO Candace Rodriguez is a 56 y.o. female presenting to clinic today for:  1.  Depressive disorder/ RLS Patient had endorsed anhedonia last visit, decreased interest in things that normally bring her happiness.  We started her on Wellbutrin 150 mg daily.  She notes that energy has gotten slightly better but really none of the other symptoms improved.  She is currently being treated for restless leg with new medications and that does seem to be helping though she admits that it has caused some easy daytime sedation.  2.  Constipation Patient's last thyroid levels were normal.  She reports constipation over the last several weeks that are not improved by stool softeners, probiotics, magnesium citrate, etc.  She has not tried MiraLAX yet.  Denies any blood in stool, nausea or vomiting.  She is on Wegovy and iron with most recent increase in [facility] to 2.4 mg just 1 week ago   ROS: Per HPI  Allergies  Allergen Reactions   Avelox [Moxifloxacin]    Gluten Meal Other (See Comments)    Joint Pain   Other Other (See Comments)    Dairy intolerance   Penicillin G Itching and Other (See Comments)    Turns red in color.   Penicillins    Quinolones Other (See Comments)    Pt can't remeber   Soy Allergy Rash    Rash in mouth.   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 , Disp: , Rfl:    carbidopa-levodopa (SINEMET IR) 25-100 MG tablet, Take 1 tablet by mouth daily as needed., Disp: 90 tablet, Rfl: 0   Cholecalciferol (VITAMIN D3 PO), Take 20,000 mg by mouth daily., Disp: , Rfl:    clobetasol (TEMOVATE) 0.05 % external solution, Apply 1 Application topically daily as needed (dermatitis of scalp)., Disp: 50 mL, Rfl: 0   diclofenac (VOLTAREN) 75 MG EC tablet, Take 75 mg by mouth 2 (two) times daily., Disp: , Rfl:    diphenhydrAMINE HCl (BENADRYL ALLERGY PO), Take by mouth as needed., Disp: , Rfl:    Iron, Ferrous Sulfate, 325 (65 Fe) MG TABS, Take 1 tablet by mouth in the morning and at bedtime., Disp: 180 tablet, Rfl: 1   levothyroxine (SYNTHROID) 112  MCG tablet, TAKE 1 TABLET BY MOUTH EVERY MORNING BEFORE BREAKFAST, Disp: 90 tablet, Rfl: 0   levothyroxine (SYNTHROID) 125 MCG tablet, Take 145mg on Saturday and Sundays.  Continue 112 mcg the remainder of the week., Disp: 24 tablet, Rfl: 3   liothyronine (CYTOMEL) 25 MCG tablet, Take by mouth daily. Half in morning half in afternoon, Disp: , Rfl:    Magnesium Malate POWD, 1,500 mg by Does not apply route., Disp: , Rfl:    Melatonin 10 MG TABS, Take by mouth daily., Disp: , Rfl:    Methylcobalamin 50000 MCG SOLR, Inject as directed every 7 (seven) days., Disp: , Rfl:    metoprolol succinate (TOPROL XL) 50 MG 24 hr tablet, Take 1 tablet (50 mg total) by mouth daily., Disp: 90 tablet, Rfl: 3   Multiple Vitamin (MULTIVITAMIN) tablet, Take 1 tablet by mouth 3 (three) times daily.  Celebrate Bariatric, Disp: , Rfl:    OVER THE COUNTER MEDICATION, 240 mg daily. pine bark extract, Disp: , Rfl:    Probiotic Product (PROBIOTIC PO), Take by mouth daily., Disp: , Rfl:    Rotigotine 1 MG/24HR PT24, Samples of this drug were given to the patient, quantity 4, Lot Number [id number] ;exp 10/2022, Disp: 1 patch, Rfl: 0   testosterone enanthate (DELATESTRYL) 200 MG/ML injection, Inject into the muscle every 7 (seven) days. 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   Smoking status: Former    Packs/day: 2.00    Years: 10.00    Total pack years: 20.00    Types: Cigarettes    Quit date: 05/26/1998    Years since quitting: 23.7   Smokeless tobacco: Never  Vaping Use   Vaping Use: Never used  Substance and Sexual Activity   Alcohol use: No    Alcohol/week: 0.0 standard drinks of alcohol   Drug use: No   Sexual activity: Not on file  Other Topics Concern   Not on file  Social History Narrative   Right and left handed   Work sElectrical engineer   Social Determinants of Health   Financial Resource Strain: Not on file  Food Insecurity: Not on file  Transportation Needs: Not on file  Physical Activity: Not on file  Stress: Not on file  Social Connections: Not on file  Intimate Partner Violence: Not on file   Family History  Problem Relation Age of Onset   Arthritis Mother    Arthritis Father    Hypertension Father    Atrial fibrillation Brother    Cancer Maternal Grandmother    Heart disease Maternal Grandmother    Cancer Maternal Grandfather    Cancer Paternal Grandmother    Heart disease Paternal Grandfather     Objective: Office vital signs reviewed. BP 111/71   Pulse 81   Temp 97.9 F  (36.6 C)   Ht '5\' 8"'$  (1.727 m)   Wt 220 lb 6.4 oz (100 kg)   SpO2 94%   BMI 33.51 kg/m   Physical Examination:  General: Awake, alert, well nourished, No acute distress HEENT: Sclera white.  Moist mucous membranes Cardio: regular rate and rhythm, S1S2 heard, no murmurs appreciated Pulm: clear to auscultation bilaterally,  no wheezes, rhonchi or rales; normal work of breathing on room air Neuro: No tremor or abnormal limb movements appreciated     03/05/2022    8:03 AM 01/03/2022    9:18 AM 01/03/2022    8:31 AM  Depression screen PHQ 2/9  Decreased Interest '2 3 3  '$ Down, Depressed, Hopeless 0 0 0  PHQ - 2 Score '2 3 3  '$ Altered sleeping 0 0 0  Tired, decreased energy '1 2 2  '$ Change in appetite 0 0 0  Feeling bad or failure about yourself  0 0 0  Trouble concentrating '2 2 2  '$ Moving slowly or fidgety/restless 0 0 0  Suicidal thoughts 0 0 0  PHQ-9 Score '5 7 7  '$ Difficult doing work/chores Somewhat difficult Very difficult Very difficult      03/05/2022    8:03 AM 01/03/2022    8:31 AM 08/12/2021   10:34 AM 07/17/2021    2:57 PM  GAD 7 : Generalized Anxiety Score  Nervous, Anxious, on Edge 0 0 0 0  Control/stop worrying 0 0 0 0  Worry too much - different things 0 0 0 0  Trouble relaxing 0 0 0 0  Restless 0 0 0 0  Easily annoyed or irritable 0 0 0 0  Afraid - awful might happen 0 0 0 0  Total GAD 7 Score 0 0 0 0  Anxiety Difficulty Not difficult at all Not difficult at all Not difficult at all Not difficult at all     Assessment/ Plan: 56 y.o. female   Depression, major, single episode, mild (HCC) - Plan: vortioxetine HBr (TRINTELLIX) 5 MG TABS tablet  Other constipation  Restless leg syndrome  No significant improvement in this depressive symptoms with the Wellbutrin.  Offered either doubling the Wellbutrin or switching to something else.  We will proceed with Trintellix and lieu of the Wellbutrin.  She may stop the Wellbutrin and start the Trintellix.  1 month worth of  samples provided and I have sent this to the pharmacy as I suspect they will need to order it.  We discussed if she has any concerning signs or symptoms prior to our next visit she is to contact me.  Otherwise plan to follow-up in roughly 6 to 8 weeks for recheck.  For her constipation, this is likely medication induced due to [facility] and use of iron.  Advised to start MiraLAX one half capful mixed with 4 ounces daily and may increase to 1 capful twice daily if needed.  Follow-up as needed this issue and we can consider something like Linzess or Trulance if symptoms or not improving  Restless leg syndrome is managed by Dr. Carles Collet, neurologist.  She is responding to current meds but they are slightly sedating.  Continue to monitor  No orders of the defined types were placed in this encounter.  No orders of the defined types were placed in this encounter.    Candace Norlander, DO [place] [phone]

## 2022-03-07 ENCOUNTER — Other Ambulatory Visit: Payer: Self-pay | Admitting: Medical

## 2022-03-11 NOTE — Telephone Encounter (Signed)
Reached out to PA team to see if this has been worked on

## 2022-03-13 ENCOUNTER — Other Ambulatory Visit (HOSPITAL_COMMUNITY): Payer: Self-pay

## 2022-03-13 ENCOUNTER — Other Ambulatory Visit: Payer: Self-pay

## 2022-03-13 MED ORDER — ROTIGOTINE 1 MG/24HR TD PT24: MEDICATED_PATCH | TRANSDERMAL | 2 refills | Status: DC

## 2022-03-13 NOTE — Telephone Encounter (Signed)
PA approved prescription sent to [facility] called patietn coming to get samples until her medicine arrives

## 2022-04-21 ENCOUNTER — Other Ambulatory Visit: Payer: Self-pay | Admitting: Family Medicine

## 2022-04-21 DIAGNOSIS — E039 Hypothyroidism, unspecified: Secondary | ICD-10-CM

## 2022-04-21 DIAGNOSIS — F320 Major depressive disorder, single episode, mild: Secondary | ICD-10-CM

## 2022-04-22 NOTE — Telephone Encounter (Signed)
This is managed by [facility].

## 2022-05-06 ENCOUNTER — Encounter: Payer: Self-pay | Admitting: Nurse Practitioner

## 2022-05-06 ENCOUNTER — Ambulatory Visit: Payer: BC Managed Care – PPO | Admitting: Nurse Practitioner

## 2022-05-06 DIAGNOSIS — R051 Acute cough: Secondary | ICD-10-CM | POA: Diagnosis not present

## 2022-05-06 DIAGNOSIS — J0110 Acute frontal sinusitis, unspecified: Secondary | ICD-10-CM

## 2022-05-06 MED ORDER — BENZONATATE 100 MG PO CAPS: 100.0000 mg | ORAL_CAPSULE | Freq: Three times a day (TID) | ORAL | 0 refills | Status: DC | PRN

## 2022-05-06 MED ORDER — DM-GUAIFENESIN ER 30-600 MG PO TB12: 1.0000 | ORAL_TABLET | Freq: Two times a day (BID) | ORAL | 0 refills | Status: DC

## 2022-05-06 MED ORDER — AZITHROMYCIN 250 MG PO TABS: ORAL_TABLET | ORAL | 0 refills | Status: AC

## 2022-05-06 NOTE — Patient Instructions (Signed)

## 2022-05-06 NOTE — Progress Notes (Signed)
   Virtual Visit  Note Due to COVID-19 pandemic this visit was conducted virtually. This visit type was conducted due to national recommendations for restrictions regarding the COVID-19 Pandemic (e.g. social distancing, sheltering in place) in an effort to limit this patient's exposure and mitigate transmission in our community. All issues noted in this document were discussed and addressed.  A physical exam was not performed with this format.  I connected with Candace Rodriguez on 05/06/22 at 11;00 am by telephone and verified that I am speaking with the correct person using two identifiers. [name] is currently located at home during visit. The provider, Ivy Lynn, NP is located in their office at time of visit.  I discussed the limitations, risks, security and privacy concerns of performing an evaluation and management service by telephone and the availability of in person appointments. I also discussed with the patient that there may be a patient responsible charge related to this service. The patient expressed understanding and agreed to proceed.   History and Present Illness:  Cough This is a new problem. The current episode started yesterday. The problem has been gradually worsening. The problem occurs constantly. The cough is Productive of sputum. Associated symptoms include chills, ear pain, headaches and nasal congestion. Pertinent negatives include no rash.  Sinusitis This is a new problem. Episode onset: in the past 4-5 days. The problem has been gradually worsening since onset. There has been no fever. Associated symptoms include chills, coughing, ear pain, headaches and sinus pressure. Past treatments include acetaminophen. The treatment provided no relief.      Review of Systems  Constitutional:  Positive for chills.  HENT:  Positive for ear pain and sinus pressure.   Respiratory:  Positive for cough.   Cardiovascular: Negative.   Genitourinary: Negative.    Musculoskeletal: Negative.   Skin: Negative.  Negative for itching and rash.  Neurological:  Positive for headaches.     Observations/Objective: Tele-visit patient is not in distress  Assessment and Plan: Patient presents with URI and sinusitis with no resolution Take meds as prescribed - Use a cool mist humidifier  -Use saline nose sprays frequently -Force fluids -For fever or aches or pains- take Tylenol or ibuprofen. -If symptoms do not improve, she may need to be COVID tested to rule this out Follow up with worsening unresolved symptoms   Follow Up Instructions: Follow up with worsening unresolved symptoms    I discussed the assessment and treatment plan with the patient. The patient was provided an opportunity to ask questions and all were answered. The patient agreed with the plan and demonstrated an understanding of the instructions.   The patient was advised to call back or seek an in-person evaluation if the symptoms worsen or if the condition fails to improve as anticipated.  The above assessment and management plan was discussed with the patient. The patient verbalized understanding of and has agreed to the management plan. Patient is aware to call the clinic if symptoms persist or worsen. Patient is aware when to return to the clinic for a follow-up visit. Patient educated on when it is appropriate to go to the emergency department.   Time call ended:  11;12 pm   I provided 12 minutes of  non face-to-face time during this encounter.    Ivy Lynn, NP

## 2022-05-20 ENCOUNTER — Telehealth (INDEPENDENT_AMBULATORY_CARE_PROVIDER_SITE_OTHER): Payer: BC Managed Care – PPO | Admitting: Family Medicine

## 2022-05-20 DIAGNOSIS — K5909 Other constipation: Secondary | ICD-10-CM

## 2022-05-20 DIAGNOSIS — G2581 Restless legs syndrome: Secondary | ICD-10-CM

## 2022-05-20 DIAGNOSIS — F320 Major depressive disorder, single episode, mild: Secondary | ICD-10-CM

## 2022-05-20 MED ORDER — VORTIOXETINE HBR 5 MG PO TABS: 5.0000 mg | ORAL_TABLET | Freq: Every morning | ORAL | 3 refills | Status: DC

## 2022-05-20 NOTE — Progress Notes (Signed)
MyChart Video visit  Subjective: JO:INOM PCP: Janora Norlander, DO VEH:MCNOBS R Laramee is a 56 y.o. female. Patient provides verbal consent for consult held via video.  Due to COVID-19 pandemic this visit was conducted virtually. This visit type was conducted due to national recommendations for restrictions regarding the COVID-19 Pandemic (e.g. social distancing, sheltering in place) in an effort to limit this patient's exposure and mitigate transmission in our community. All issues noted in this document were discussed and addressed.  A physical exam was not performed with this format.   Location of patient: parent's house Location of provider: [facility] Others present for call: none  1. Depression  Patient reports feeling better on Trintellix.  Still having low energy/ motivation. Not sure if this is because of being busy with a 13mold puppy.   2. RLS She reports that sleepiness has resolved.  Neupro patch is working well.  Taking 1/2 tablet of Sinemet at bedtime.  3. Constipation Patient reports that it has resolved and she is not doing anything.  She is currently injecting 2.'4mg'$  of Wegovy weekly.  ROS: Per HPI  Allergies  Allergen Reactions   Avelox [Moxifloxacin]    Gluten Meal Other (See Comments)    Joint Pain   Other Other (See Comments)    Dairy intolerance   Penicillin G Itching and Other (See Comments)    Turns red in color.   Penicillins    Quinolones Other (See Comments)    Pt can't remeber   Soy Allergy Rash    Rash in mouth.   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 , Disp: , Rfl:    carbidopa-levodopa (SINEMET IR) 25-100 MG tablet, Take 1 tablet by mouth daily as needed., Disp: 90 tablet, Rfl: 0   Cholecalciferol (VITAMIN D3 PO), Take 20,000 mg by mouth daily., Disp: , Rfl:    clobetasol (TEMOVATE) 0.05 % external solution, Apply 1 Application topically daily as needed (dermatitis of scalp)., Disp: 50 mL, Rfl: 0   dextromethorphan-guaiFENesin (MUCINEX DM) 30-600 MG 12hr tablet, Take 1 tablet by mouth 2 (two) times daily., Disp: 30 tablet, Rfl: 0   diclofenac (VOLTAREN) 75 MG EC tablet, Take 75 mg by mouth 2 (two) times daily., Disp: , Rfl:    diphenhydrAMINE  HCl (BENADRYL ALLERGY PO), Take by mouth as needed., Disp: , Rfl:    Iron, Ferrous Sulfate, 325 (65 Fe) MG TABS, Take 1 tablet by mouth in the morning and at bedtime., Disp: 180 tablet, Rfl: 1   levothyroxine (SYNTHROID) 112 MCG tablet, TAKE 1 TABLET BY MOUTH EVERY MORNING BEFORE BREAKFAST, Disp: 90 tablet, Rfl: 0   levothyroxine (SYNTHROID) 125 MCG tablet, Take 139mg on Saturday and Sundays.  Continue 112 mcg the remainder of the week., Disp: 24 tablet, Rfl: 3   liothyronine (CYTOMEL) 25 MCG tablet, Take by mouth daily. Half in morning half in afternoon, Disp: , Rfl:    Magnesium Malate POWD, 1,500 mg by Does not apply route., Disp: , Rfl:    Melatonin 10 MG TABS, Take by mouth  daily., Disp: , Rfl:    Methylcobalamin 50000 MCG SOLR, Inject as directed every 7 (seven) days., Disp: , Rfl:    metoprolol succinate (TOPROL-XL) 50 MG 24 hr tablet, TAKE ONE (1) TABLET BY MOUTH EVERY DAY, Disp: 90 tablet, Rfl: 3   Multiple Vitamin (MULTIVITAMIN) tablet, Take 1 tablet by mouth 3 (three) times daily. Celebrate Bariatric, Disp: , Rfl:    Probiotic Product (PROBIOTIC PO), Take by mouth daily., Disp: , Rfl:    Rotigotine 1 MG/24HR PT24, Place one patch on arm per day, Disp: 30 patch, Rfl: 2   Semaglutide-Weight Management (WEGOVY) 2.4 MG/0.75ML SOAJ, Inject 2.4 mg into the skin., Disp: , Rfl:    testosterone enanthate (DELATESTRYL) 200 MG/ML injection, Inject into the muscle every 7 (seven) days. For IM use only, Disp: , Rfl:    vortioxetine HBr (TRINTELLIX) 5 MG TABS tablet, TAKE ONE TABLET BY MOUTH EVERY MORNING, Disp: 30 tablet, Rfl: 0  Gen: well appearing female, NAD Psych: mood stable, speech normal. Pleasant and interactive. Neuro: oriented. No tremor appreciated   Assessment/ Plan: 56y.o. female   Depression, major, single episode, mild (HCC) - Plan: vortioxetine HBr (TRINTELLIX) 5 MG TABS tablet  Restless leg syndrome  Other constipation  Trintellix renewed at '5mg'$ .  Will trial '10mg'$  and let me know if she wants to switch to that dose.    RLS doing well.  Going to trial off sinemet totally  Constipation resolved.   Start time: 12:28pm End time: 12:37pm  Total time spent on patient care (including video visit/ documentation): 9 minutes  [place] DCarter([phone]

## 2022-05-22 NOTE — Telephone Encounter (Signed)
closed

## 2022-05-29 ENCOUNTER — Other Ambulatory Visit: Payer: Self-pay | Admitting: Neurology

## 2022-06-04 ENCOUNTER — Other Ambulatory Visit: Payer: Self-pay | Admitting: Neurology

## 2022-06-04 ENCOUNTER — Encounter: Payer: Self-pay | Admitting: Family Medicine

## 2022-06-05 ENCOUNTER — Encounter: Payer: Self-pay | Admitting: Family Medicine

## 2022-06-06 ENCOUNTER — Other Ambulatory Visit: Payer: Self-pay | Admitting: Family Medicine

## 2022-06-06 DIAGNOSIS — F320 Major depressive disorder, single episode, mild: Secondary | ICD-10-CM

## 2022-06-06 MED ORDER — VORTIOXETINE HBR 10 MG PO TABS: 10.0000 mg | ORAL_TABLET | Freq: Every morning | ORAL | 3 refills | Status: DC

## 2022-06-12 ENCOUNTER — Other Ambulatory Visit: Payer: Self-pay | Admitting: Family Medicine

## 2022-06-12 DIAGNOSIS — E039 Hypothyroidism, unspecified: Secondary | ICD-10-CM

## 2022-08-19 NOTE — Progress Notes (Unsigned)
Assessment/Plan:   1.  Restless leg  -Continue neupro, 1 mg daily.  She uses flonase to avoid site reaction.  Samples given today  -Continue carbidopa/levodopa 25/100 as needed  -If needs something more in the future, I would recommend gabapentin  -Nonpharmacologic things like weighted blankets could be beneficial. 2.  Patient will f/u with PCP since doing well.  Her specialist copay is quite high.  Happy to see her prn.   Subjective:   Candace Rodriguez was seen today in follow up for restless leg.  My previous records as well as any outside records available were reviewed prior to todays visit.  Last visit, we discussed the phenomenon of augmentation with the dopamine agonists.  We decided to go ahead and change her to rotigotine so that she would get 24-hour coverage, even though this also can have augmentation.  I also told her I only wanted her using the levodopa that she was on on an as-needed basis.  She reports today that "I really like the patch compared to the ropinirole."  She is still taking 1/2 q hs levodopa.  She uses flonase to avoid site reactions.    Current movement disorder medications: Rotigotine, 1 mg daily Carbidopa/levodopa 25/100 as needed  PREVIOUS MEDICATIONS: Ropinirole 2 mg -3 mg  CURRENT MEDICATIONS:  Outpatient Encounter Medications as of 08/20/2022  Medication Sig   albuterol (VENTOLIN HFA) 108 (90 Base) MCG/ACT inhaler Inhale 2 puffs into the lungs every 6 (six) hours as needed for wheezing or shortness of breath.   Calcium 200 MG TABS Take by mouth 2 (two) times daily.    carbidopa-levodopa (SINEMET IR) 25-100 MG tablet Take 1 tablet by mouth daily as needed.   Cholecalciferol (VITAMIN D3 PO) Take 20,000 mg by mouth daily.   clobetasol (TEMOVATE) 0.05 % external solution Apply 1 Application topically daily as needed (dermatitis of scalp).   dextromethorphan-guaiFENesin (MUCINEX DM) 30-600 MG 12hr tablet Take 1 tablet by mouth 2 (two) times daily.    diclofenac (VOLTAREN) 75 MG EC tablet Take 75 mg by mouth 2 (two) times daily.   diphenhydrAMINE HCl (BENADRYL ALLERGY PO) Take by mouth as needed.   Iron, Ferrous Sulfate, 325 (65 Fe) MG TABS Take 1 tablet by mouth in the morning and at bedtime.   levothyroxine (SYNTHROID) 112 MCG tablet TAKE 1 TABLET BY MOUTH EVERY MORNING BEFORE BREAKFAST   levothyroxine (SYNTHROID) 125 MCG tablet TAKE 1 TABLET (125MCG) ON SATURDAYS & SUNDAYS. CONTINUE 112MCG THE REAINDER OF THE WEEK   liothyronine (CYTOMEL) 25 MCG tablet Take by mouth daily. Half in morning half in afternoon   Magnesium Malate POWD 1,500 mg by Does not apply route.   Melatonin 10 MG TABS Take by mouth daily.   Methylcobalamin 50000 MCG SOLR Inject as directed every 7 (seven) days.   metoprolol succinate (TOPROL-XL) 50 MG 24 hr tablet TAKE ONE (1) TABLET BY MOUTH EVERY DAY   Multiple Vitamin (MULTIVITAMIN) tablet Take 1 tablet by mouth 3 (three) times daily. Celebrate Bariatric   Probiotic Product (PROBIOTIC PO) Take by mouth daily.   Rotigotine (NEUPRO) 1 MG/24HR PT24 PLACE 1 PATCH ON ARM PER   DAY   Semaglutide-Weight Management (WEGOVY) 2.4 MG/0.75ML SOAJ Inject 2.4 mg into the skin.   testosterone enanthate (DELATESTRYL) 200 MG/ML injection Inject into the muscle every 7 (seven) days. For IM use only   vortioxetine HBr (TRINTELLIX) 10 MG TABS tablet Take 1 tablet (10 mg total) by mouth every morning.   No facility-administered  encounter medications on file as of 08/20/2022.   Objective:   PHYSICAL EXAMINATION:    VITALS:   Vitals:   08/20/22 0854  BP: 124/76  Pulse: 85  SpO2: 95%  Weight: 198 lb 6.4 oz (90 kg)  Height: 5\' 8"  (1.727 m)    GEN:  Normal appears female in no acute distress.  Appears stated age. HEENT:  Normocephalic, atraumatic. The mucous membranes are moist.    NEUROLOGICAL: Orientation:  The patient is alert and oriented x 3.   Cranial nerves: There is good facial symmetry.  Speech is fluent and clear. Soft  palate rises symmetrically and there is no tongue deviation. Hearing is intact to conversational tone. Tone: Tone is good throughout. Sensation: Sensation is intact to light touch throughout Coordination:  The patient has no difficulty with RAM's or FNF bilaterally. Motor: Strength is at least antigravity x 4 Gait and Station: The patient is able to ambulate without difficulty.      Cc:  Candace Norlander, DO

## 2022-08-20 ENCOUNTER — Other Ambulatory Visit: Payer: Self-pay | Admitting: Family Medicine

## 2022-08-20 ENCOUNTER — Ambulatory Visit: Payer: BC Managed Care – PPO | Admitting: Neurology

## 2022-08-20 ENCOUNTER — Encounter: Payer: Self-pay | Admitting: Neurology

## 2022-08-20 VITALS — BP 124/76 | HR 85 | Ht 68.0 in | Wt 198.4 lb

## 2022-08-20 DIAGNOSIS — E039 Hypothyroidism, unspecified: Secondary | ICD-10-CM

## 2022-08-20 DIAGNOSIS — G2581 Restless legs syndrome: Secondary | ICD-10-CM | POA: Diagnosis not present

## 2022-08-20 MED ORDER — NEUPRO 1 MG/24HR TD PT24: MEDICATED_PATCH | TRANSDERMAL | 0 refills | Status: DC

## 2022-08-20 NOTE — Patient Instructions (Signed)
Good to see you   

## 2022-09-09 NOTE — Progress Notes (Unsigned)
Office Visit Note  Patient: Candace Rodriguez             Date of Birth: 02-Sep-1965           MRN: [id number]             PCP: Raliegh Ip, DO Referring: Raliegh Ip, DO Visit Date: 09/23/2022 Occupation: @GUAROCC @  Subjective:  Right shoulder joint pain   History of Present Illness: Candace Rodriguez is a 57 y.o. female with history of gout, osteoarthritis, and osteopenia.  Patient continues to have intermittent arthralgias and joint stiffness.  She has been taking diclofenac 75 mg 1 tablet daily for pain relief but does not feel that it is beneficial.  Patient reports that she recently ran out of her prescription for diclofenac and has been off for about 1 week and has not noticed any clinical difference.  Patient continues to have discomfort in her right knee replacement especially when going up and down steps.  Patient reports for the last 1 week she has also had increased discomfort in her right shoulder.  She was having pain with range of motion but denies any nocturnal pain.  She denies any recent injury or fall.  She had a right glenohumeral joint cortisone injection performed on 08/17/2018 which previously provided significant relief.  She has not tried any over-the-counter products for pain relief.  She denies any recent gout flares.   Activities of Daily Living:  Patient reports morning stiffness for 30 minutes.   Patient Reports nocturnal pain.  Difficulty dressing/grooming: Denies Difficulty climbing stairs: Reports Difficulty getting out of chair: Reports Difficulty using hands for taps, buttons, cutlery, and/or writing: Denies  Review of Systems  Constitutional:  Negative for fatigue.  HENT:  Negative for mouth sores and mouth dryness.   Eyes:  Positive for dryness.  Respiratory:  Negative for shortness of breath.   Cardiovascular:  Negative for chest pain and palpitations.  Gastrointestinal:  Negative for blood in stool, constipation and diarrhea.   Endocrine: Negative for increased urination.  Genitourinary:  Negative for involuntary urination.  Musculoskeletal:  Positive for joint pain, joint pain, joint swelling and morning stiffness. Negative for gait problem, myalgias, muscle weakness, muscle tenderness and myalgias.  Skin:  Negative for color change, rash, hair loss and sensitivity to sunlight.  Allergic/Immunologic: Negative for susceptible to infections.  Neurological:  Negative for dizziness, numbness and headaches.  Hematological:  Negative for swollen glands.  Psychiatric/Behavioral:  Negative for depressed mood and sleep disturbance. The patient is not nervous/anxious.     PMFS History:  Patient Active Problem List   Diagnosis Date Noted   Paroxysmal atrial tachycardia 01/03/2022   Primary osteoarthritis of both hands 08/16/2018   DDD (degenerative disc disease), lumbar 08/16/2018   History of restless legs syndrome 07/24/2016   History of migraine 07/15/2016   History of hypothyroidism 07/15/2016   History of partial knee replacement 07/15/2016   Hyperbilirubinemia 11/05/2015   Chronic venous insufficiency 10/15/2015   Healthcare maintenance 10/15/2015   OSA (obstructive sleep apnea) 10/25/2014   PCOS (polycystic ovarian syndrome) 06/12/2014   Peripheral edema 06/12/2014   Postprandial RUQ pain 12/07/2013   Asthma 11/16/2012   Morbid obesity with BMI of 50.0-59.9, adult (HCC) 11/16/2012   Dyslipidemia 09/13/2012   Essential hypertension, benign 09/13/2012   Hypothyroidism 09/13/2012   Restless leg syndrome, controlled 09/13/2012   Migraine headache 09/13/2012   Gout 09/13/2012    Past Medical History:  Diagnosis Date   Asthma  Basal cell carcinoma 04/13/2019   left upper back-[id number]   Depression    DJD (degenerative joint disease)    Fractures    Left distal radius,  left patella   History of COVID-19 07/01/2019   Hyperlipidemia    Hypertension    Lyme disease    Migraine    Obesity     Obesity, morbid (HCC) 09/13/2012   OSA (obstructive sleep apnea)    [facility]   Restless leg syndrome    SCC (squamous cell carcinoma) 04/13/2019   right side [id number]   Sleep apnea    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   left cheek-[id number]   Thyroid disease    Tinnitus     Family History  Problem Relation Age of Onset   Arthritis Mother    Arthritis Father    Hypertension Father    Atrial fibrillation Brother    Cancer Maternal Grandmother    Heart disease Maternal Grandmother    Cancer Maternal Grandfather    Cancer Paternal Grandmother    Heart disease Paternal Grandfather    Past Surgical History:  Procedure Laterality Date   CHOLECYSTECTOMY  10/2013   JOINT REPLACEMENT Bilateral 10/2008    knee each partial replacement   KNEE SURGERY     LAPAROSCOPIC ABDOMINAL EXPLORATION  06/1993   PARTIAL GASTRECTOMY  11/16/2012   VSG    WRIST SURGERY  09/1998   L   Social History   Social History Narrative   Right and left handed   Work Audiological scientist History  Administered Date(s) Administered   Influenza,inj,Quad PF,6+ Mos 02/16/2017   Pneumococcal Polysaccharide-23 03/17/2006, 11/15/2013   Td 12/23/2007   Zoster Recombinat (Shingrix) 12/08/2016, 02/16/2017     Objective: Vital Signs: BP 120/78 (BP Location: Left Arm, Patient Position: Sitting, Cuff Size: Large)   Pulse 83   Resp 15   Ht 5\' 8"  (1.727 m)   Wt 201 lb 9.6 oz (91.4 kg)   BMI 30.65 kg/m    Physical Exam Vitals and nursing note reviewed.  Constitutional:      Appearance: She is well-developed.  HENT:     Head: Normocephalic and atraumatic.  Eyes:     Conjunctiva/sclera: Conjunctivae normal.  Cardiovascular:     Rate and Rhythm: Normal rate and regular rhythm.     Heart sounds: Normal heart sounds.  Pulmonary:     Effort: Pulmonary effort is normal.     Breath sounds: Normal breath sounds.  Abdominal:     General: Bowel sounds are  normal.     Palpations: Abdomen is soft.  Musculoskeletal:     Cervical back: Normal range of motion.  Lymphadenopathy:     Cervical: No cervical adenopathy.  Skin:    General: Skin is warm and dry.     Capillary Refill: Capillary refill takes less than 2 seconds.  Neurological:     Mental Status: She is alert and oriented to person, place, and time.  Psychiatric:        Behavior: Behavior normal.      Musculoskeletal Exam: C-spine, thoracic spine, lumbar spine good range of motion.  Shoulder joints have good range of motion with some discomfort in the right shoulder.  Elbow joints, wrist joints, MCPs, PIPs, DIPs have good range of motion with no synovitis.  Some PIP and DIP prominence consistent with osteoarthritis of both hands.  Complete fist formation bilaterally.  Hip joints have good range of  motion with no groin pain.  Both knee replacements have good range of motion with some discomfort in the right knee.  Ankle joints have good range of motion with no tenderness or joint swelling.  CDAI Exam: CDAI Score: -- Patient Global: --; Provider Global: -- Swollen: --; Tender: -- Joint Exam 09/23/2022   No joint exam has been documented for this visit   There is currently no information documented on the homunculus. Go to the Rheumatology activity and complete the homunculus joint exam.  Investigation: No additional findings.  Imaging: XR Shoulder Right  Result Date: 09/23/2022 No glenohumeral or acromioclavicular joint space narrowing was noted.  No chondrocalcinosis was noted. Impression: Unremarkable x-rays of the shoulder.   Recent Labs: Lab Results  Component Value Date   WBC 5.9 09/03/2021   HGB 15.5 09/03/2021   PLT 200 09/03/2021   NA 143 07/18/2021   K 4.9 07/18/2021   CL 106 07/18/2021   CO2 24 07/18/2021   GLUCOSE 89 07/18/2021   BUN 11 07/18/2021   CREATININE 0.69 07/18/2021   BILITOT 0.9 07/18/2021   ALKPHOS 79 07/18/2021   AST 20 07/18/2021   ALT 30  07/18/2021   PROT 6.2 07/18/2021   ALBUMIN 4.3 07/18/2021   CALCIUM 9.9 07/18/2021   GFRAA 119 09/04/2017    Speciality Comments: No specialty comments available.  Procedures:  Large Joint Inj: R glenohumeral on 09/23/2022 1:47 PM Indications: pain Details: 27 G 1.5 in needle, posterior approach  Arthrogram: No  Medications: 1 mL lidocaine 1 %; 40 mg triamcinolone acetonide 40 MG/ML Aspirate: 0 mL Outcome: tolerated well, no immediate complications Procedure, treatment alternatives, risks and benefits explained, specific risks discussed. Consent was given by the patient. Immediately prior to procedure a time out was called to verify the correct patient, procedure, equipment, support staff and site/side marked as required. Patient was prepped and draped in the usual sterile fashion.     Allergies: Avelox [moxifloxacin], Gluten meal, Other, Penicillin g, Penicillins, Quinolones, and Soy allergy     Assessment / Plan:     Visit Diagnoses: Primary osteoarthritis of both hands: She has PIP and DIP thickening consistent with osteoarthritis of both hands.  No tenderness or synovitis noted.  No dactylitis noted.  Complete fist formation bilaterally.  Discussed the importance of joint protection and muscle strengthening.  Chronic right shoulder pain - Patient presents today with increased discomfort in the right shoulder x 1 week.  She had no injury prior to the onset of symptoms.  She had a right glenohumeral joint injection performed on 08/17/2018 which provided significant relief but now her symptoms have recurred.  Several days ago she was having significant discomfort with range of motion.  On examination today she had good range of motion with some discomfort.  X-rays of the right shoulder were obtained today which were unremarkable.  After informed consent the right glenohumeral joint was injected with cortisone.  She tolerated the procedure well.  Procedure note was completed above.   Aftercare was discussed.  She was advised to notify us if her symptoms persist or worsen.  She was given a handout of exercises to perform once her symptoms have improved.  Plan: XR Shoulder Right, Large Joint Inj: R glenohumeral  History of bilateral partial knee replacement - She continues to have intermittent discomfort in the right knee replacement especially when climbing steps.  She is prescribed diclofenac 75 mg 1 tablet daily as needed for pain relief.  She has not found diclofenac to  be as effective at managing her symptoms.  She has been out of her prescription for diclofenac for 1 week and has not noticed any difference in her pain level on or off therapy.   DDD (degenerative disc disease), lumbar: She is not currently experiencing any increased discomfort in her lower back.  No symptoms of radiculopathy.  Idiopathic chronic gout of multiple sites without tophus: She has not had any signs or symptoms of a gout flare.  She is not currently taking a urate lowering medication.  She was vies notify us if she develops signs or symptoms of a gout flare.  Osteopenia of multiple sites - DEXA ordered PCP. DEXA updated on 08/02/21: RFN BMD 0.829 with T-score -1.5.  She is taking a calcium and vitamin D supplement daily.  Other medical conditions are listed as follows:  Essential hypertension, benign: Blood pressure is 120/78 today in the office.  Patient was advised to monitor her blood pressure closely following the cortisone injection today.  History of migraine  Dyslipidemia  History of restless legs syndrome  OSA (obstructive sleep apnea)  History of hypothyroidism   Orders: Orders Placed This Encounter  Procedures   Large Joint Inj: R glenohumeral   XR Shoulder Right   No orders of the defined types were placed in this encounter.    Follow-Up Instructions: Return in about 1 year (around 09/23/2023) for Osteoarthritis.   Gearldine Bienenstock, PA-C  Note - This record has been  created using Dragon software.  Chart creation errors have been sought, but may not always  have been located. Such creation errors do not reflect on  the standard of medical care.

## 2022-09-23 ENCOUNTER — Ambulatory Visit (INDEPENDENT_AMBULATORY_CARE_PROVIDER_SITE_OTHER): Payer: BC Managed Care – PPO

## 2022-09-23 ENCOUNTER — Encounter: Payer: Self-pay | Admitting: Physician Assistant

## 2022-09-23 ENCOUNTER — Ambulatory Visit: Payer: BC Managed Care – PPO | Attending: Physician Assistant | Admitting: Physician Assistant

## 2022-09-23 VITALS — BP 120/78 | HR 83 | Resp 15 | Ht 68.0 in | Wt 201.6 lb

## 2022-09-23 DIAGNOSIS — M1A09X0 Idiopathic chronic gout, multiple sites, without tophus (tophi): Secondary | ICD-10-CM

## 2022-09-23 DIAGNOSIS — G4733 Obstructive sleep apnea (adult) (pediatric): Secondary | ICD-10-CM

## 2022-09-23 DIAGNOSIS — I10 Essential (primary) hypertension: Secondary | ICD-10-CM

## 2022-09-23 DIAGNOSIS — M8589 Other specified disorders of bone density and structure, multiple sites: Secondary | ICD-10-CM

## 2022-09-23 DIAGNOSIS — Z8639 Personal history of other endocrine, nutritional and metabolic disease: Secondary | ICD-10-CM

## 2022-09-23 DIAGNOSIS — E785 Hyperlipidemia, unspecified: Secondary | ICD-10-CM

## 2022-09-23 DIAGNOSIS — M19041 Primary osteoarthritis, right hand: Secondary | ICD-10-CM | POA: Diagnosis not present

## 2022-09-23 DIAGNOSIS — M5136 Other intervertebral disc degeneration, lumbar region: Secondary | ICD-10-CM

## 2022-09-23 DIAGNOSIS — M25511 Pain in right shoulder: Secondary | ICD-10-CM

## 2022-09-23 DIAGNOSIS — G8929 Other chronic pain: Secondary | ICD-10-CM

## 2022-09-23 DIAGNOSIS — Z96659 Presence of unspecified artificial knee joint: Secondary | ICD-10-CM | POA: Diagnosis not present

## 2022-09-23 DIAGNOSIS — M19042 Primary osteoarthritis, left hand: Secondary | ICD-10-CM

## 2022-09-23 DIAGNOSIS — Z8669 Personal history of other diseases of the nervous system and sense organs: Secondary | ICD-10-CM

## 2022-09-23 MED ORDER — LIDOCAINE HCL 1 % IJ SOLN
1.0000 mL | INTRAMUSCULAR | Status: AC | PRN
  Administered 2022-09-23: 1 mL

## 2022-09-23 MED ORDER — TRIAMCINOLONE ACETONIDE 40 MG/ML IJ SUSP
40.0000 mg | INTRAMUSCULAR | Status: AC | PRN
  Administered 2022-09-23: 40 mg via INTRA_ARTICULAR

## 2022-09-23 NOTE — Patient Instructions (Signed)

## 2022-10-13 LAB — HM MAMMOGRAPHY

## 2022-11-25 ENCOUNTER — Other Ambulatory Visit: Payer: Self-pay | Admitting: Neurology

## 2022-11-25 DIAGNOSIS — G2581 Restless legs syndrome: Secondary | ICD-10-CM

## 2022-12-01 NOTE — Progress Notes (Signed)
Cardiology Office Note:  .   Date:  12/08/2022  ID:  Candace Rodriguez, DOB June 14, 1965, MRN [id number] PCP: Raliegh Ip, DO  [facility] Providers Cardiologist:  Armanda Magic, MD    History of Present Illness: Candace Rodriguez Kitchen   Candace Rodriguez is a 57 y.o. female with a hx of PAT, asthma, depression, HTN, HLD, morbid obesity, RLS, OSA.  Patient comes in for f/u. Works out at a gym 5 days a week for 45 min. Denies chest pain, dyspnea,edema. Has occasional skip at night but overall controlled. Lost 170 lbs total from 2013-2019, gained 70 lbs back but has lost 50 lbs on wegovy but insurance wont pay for it.   ROS:    Studies Reviewed: Candace Rodriguez Kitchen    Prior CV Studies: ECHO COMPLETE WO IMAGING ENHANCING AGENT 04/01/2021  Narrative ECHOCARDIOGRAM REPORT    Patient Name:   Candace Rodriguez Date of Exam: 04/01/2021 Medical Rec #:  [id number]           Height:       68.0 in Accession #:    [id number]          Weight:       239.0 lb Date of Birth:  [birth year]           BSA:          2.204 m Patient Age:    55 years            BP:           158/89 mmHg Patient Gender: F                   HR:           87 bpm. [facility]  Procedure: 2D Echo, Cardiac Doppler and Color Doppler  Indications:    Shortness of Breath  History:        Patient has no prior history of Echocardiogram examinations. Risk Factors:Hypertension and Dyslipidemia.  Sonographer:    Mikki Harbor Referring Phys: [phone] TRACI R TURNER  IMPRESSIONS   1. Left ventricular ejection fraction, by estimation, is 55 to 60%. The left ventricle has normal function. The left ventricle has no regional wall motion abnormalities. Left ventricular diastolic parameters were normal. 2. Right ventricular systolic function is normal. The right ventricular size is normal. There is normal pulmonary artery systolic pressure. 3. The mitral valve is normal in structure. No evidence of mitral valve regurgitation. No evidence  of mitral stenosis. 4. The tricuspid valve is abnormal. 5. The aortic valve is tricuspid. Aortic valve regurgitation is not visualized. No aortic stenosis is present. 6. The inferior vena cava is normal in size with greater than 50% respiratory variability, suggesting right atrial pressure of 3 mmHg.  FINDINGS Left Ventricle: Left ventricular ejection fraction, by estimation, is 55 to 60%. The left ventricle has normal function. The left ventricle has no regional wall motion abnormalities. The left ventricular internal cavity size was normal in size. There is no left ventricular hypertrophy. Left ventricular diastolic parameters were normal.  Right Ventricle: The right ventricular size is normal. No increase in right ventricular wall thickness. Right ventricular systolic function is normal. There is normal pulmonary artery systolic pressure. The tricuspid regurgitant velocity is 2.62 m/s, and with an assumed right atrial pressure of 3 mmHg, the estimated right ventricular systolic pressure is 30.5 mmHg.  Left Atrium: Left atrial size was normal in size.  Right Atrium: Right atrial size was normal  in size.  Pericardium: There is no evidence of pericardial effusion.  Mitral Valve: The mitral valve is normal in structure. No evidence of mitral valve regurgitation. No evidence of mitral valve stenosis. MV peak gradient, 3.9 mmHg. The mean mitral valve gradient is 1.0 mmHg.  Tricuspid Valve: The tricuspid valve is abnormal. Tricuspid valve regurgitation is mild . No evidence of tricuspid stenosis.  Aortic Valve: The aortic valve is tricuspid. Aortic valve regurgitation is not visualized. No aortic stenosis is present. Aortic valve mean gradient measures 6.0 mmHg. Aortic valve peak gradient measures 11.3 mmHg. Aortic valve area, by VTI measures 2.23 cm.  Pulmonic Valve: The pulmonic valve was not well visualized. Pulmonic valve regurgitation is not visualized. No evidence of pulmonic  stenosis.  Aorta: The aortic root is normal in size and structure.  Venous: The inferior vena cava is normal in size with greater than 50% respiratory variability, suggesting right atrial pressure of 3 mmHg.  IAS/Shunts: No atrial level shunt detected by color flow Doppler.   LEFT VENTRICLE PLAX 2D LVIDd:         4.40 cm     Diastology LVIDs:         3.00 cm     LV e' medial:    10.70 cm/s LV PW:         1.00 cm     LV E/e' medial:  9.0 LV IVS:        0.80 cm     LV e' lateral:   14.60 cm/s LVOT diam:     2.00 cm     LV E/e' lateral: 6.6 LV SV:         85 LV SV Index:   38 LVOT Area:     3.14 cm  LV Volumes (MOD) LV vol d, MOD A2C: 99.1 ml LV vol d, MOD A4C: 97.9 ml LV vol s, MOD A2C: 51.5 ml LV vol s, MOD A4C: 37.5 ml LV SV MOD A2C:     47.6 ml LV SV MOD A4C:     97.9 ml LV SV MOD BP:      58.1 ml  RIGHT VENTRICLE RV Basal diam:  3.30 cm RV Mid diam:    2.70 cm RV S prime:     12.50 cm/s TAPSE (M-mode): 2.8 cm  LEFT ATRIUM             Index        RIGHT ATRIUM           Index LA diam:        4.00 cm 1.81 cm/m   RA Area:     20.20 cm LA Vol (A2C):   73.0 ml 33.11 ml/m  RA Volume:   60.10 ml  27.26 ml/m LA Vol (A4C):   51.9 ml 23.54 ml/m LA Biplane Vol: 64.1 ml 29.08 ml/m AORTIC VALVE                     PULMONIC VALVE AV Area (Vmax):    2.51 cm      PV Vmax:       1.01 m/s AV Area (Vmean):   2.07 cm      PV Peak grad:  4.1 mmHg AV Area (VTI):     2.23 cm AV Vmax:           168.00 cm/s AV Vmean:          118.000 cm/s AV VTI:  0.380 m AV Peak Grad:      11.3 mmHg AV Mean Grad:      6.0 mmHg LVOT Vmax:         134.00 cm/s LVOT Vmean:        77.600 cm/s LVOT VTI:          0.270 m LVOT/AV VTI ratio: 0.71  AORTA Ao Root diam: 2.70 cm  MITRAL VALVE               TRICUSPID VALVE MV Area (PHT): 4.65 cm    TR Peak grad:   27.5 mmHg MV Area VTI:   3.42 cm    TR Vmax:        262.00 cm/s MV Peak grad:  3.9 mmHg MV Mean grad:  1.0 mmHg     SHUNTS MV Vmax:       0.99 m/s    Systemic VTI:  0.27 m MV Vmean:      47.5 cm/s   Systemic Diam: 2.00 cm MV Decel Time: 163 msec MV E velocity: 96.40 cm/s MV A velocity: 56.60 cm/s MV E/A ratio:  1.70  Dina Rich MD Electronically signed by Dina Rich MD Signature Date/Time: 04/02/2021/3:22:14 PM    Final   No results found for this or any previous visit from the past 3650 days.       Risk Assessment/Calculations:             Physical Exam:   VS:  BP 130/82   Pulse 74   Ht 5\' 8"  (1.727 m)   Wt 207 lb (93.9 kg)   SpO2 96%   BMI 31.47 kg/m    Wt Readings from Last 3 Encounters:  12/08/22 207 lb (93.9 kg)  09/23/22 201 lb 9.6 oz (91.4 kg)  08/20/22 198 lb 6.4 oz (90 kg)    GEN: Well nourished, well developed in no acute distress NECK: No JVD; No carotid bruits CARDIAC: RRR, no murmurs, rubs, gallops RESPIRATORY:  Clear to auscultation without rales, wheezing or rhonchi  ABDOMEN: Soft, non-tender, non-distended EXTREMITIES:  No edema; No deformity   ASSESSMENT AND PLAN: .     PAT on Toprol-well controlled  OSA previously on CPAP but no OSA after weight loss  HTN-controlled  HLD-LDL 176 08/19/2021 not on statin.  Uncle died of an MI. Will repeat FLP, lipo a, cmet and cbc Friday and order coronary calcium score.        Dispo: f/u in 1 yr.  Signed, Jacolyn Reedy, PA-C

## 2022-12-08 ENCOUNTER — Encounter: Payer: Self-pay | Admitting: Physician Assistant

## 2022-12-08 ENCOUNTER — Ambulatory Visit: Payer: BC Managed Care – PPO | Attending: Physician Assistant | Admitting: Physician Assistant

## 2022-12-08 VITALS — BP 130/82 | HR 74 | Ht 68.0 in | Wt 207.0 lb

## 2022-12-08 DIAGNOSIS — E785 Hyperlipidemia, unspecified: Secondary | ICD-10-CM

## 2022-12-08 DIAGNOSIS — Z79899 Other long term (current) drug therapy: Secondary | ICD-10-CM

## 2022-12-08 DIAGNOSIS — I4719 Other supraventricular tachycardia: Secondary | ICD-10-CM

## 2022-12-08 DIAGNOSIS — E7849 Other hyperlipidemia: Secondary | ICD-10-CM

## 2022-12-08 NOTE — Patient Instructions (Signed)
Medication Instructions:  Your physician recommends that you continue on your current medications as directed. Please refer to the Current Medication list given to you today.  *If you need a refill on your cardiac medications before your next appointment, please call your pharmacy*   Lab Work: Your physician recommends that you return for lab work in: Fasting   If you have labs (blood work) drawn today and your tests are completely normal, you will receive your results only by: MyChart Message (if you have MyChart) OR A paper copy in the mail If you have any lab test that is abnormal or we need to change your treatment, we will call you to review the results.   Testing/Procedures: Coronary Calcium Score    Follow-Up: At [facility], you and your health needs are our priority.  As part of our continuing mission to provide you with exceptional heart care, we have created designated Provider Care Teams.  These Care Teams include your primary Cardiologist (physician) and Advanced Practice Providers (APPs -  Physician Assistants and Nurse Practitioners) who all work together to provide you with the care you need, when you need it.  We recommend signing up for the patient portal called "MyChart".  Sign up information is provided on this After Visit Summary.  MyChart is used to connect with patients for Virtual Visits (Telemedicine).  Patients are able to view lab/test results, encounter notes, upcoming appointments, etc.  Non-urgent messages can be sent to your provider as well.   To learn more about what you can do with MyChart, go to [email or web address].    Your next appointment:   1 year(s)  Provider:   You may see Armanda Magic, MD or one of the following Advanced Practice Providers on your designated Care Team:   Randall An, PA-C  Jacolyn Reedy, PA-C     Other Instructions Thank you for choosing [facility]!

## 2022-12-13 LAB — CBC
Hematocrit: 51.2 % — ABNORMAL HIGH (ref 34.0–46.6)
Hemoglobin: 16.8 g/dL — ABNORMAL HIGH (ref 11.1–15.9)
MCH: 28.1 pg (ref 26.6–33.0)
Platelets: 236 10*3/uL (ref 150–450)
RDW: 13.6 % (ref 11.7–15.4)

## 2022-12-13 LAB — LIPID PANEL
Chol/HDL Ratio: 3.6 ratio (ref 0.0–4.4)
Cholesterol, Total: 228 mg/dL — ABNORMAL HIGH (ref 100–199)
HDL: 64 mg/dL (ref >39)
Triglycerides: 98 mg/dL (ref 0–149)
VLDL Cholesterol Cal: 17 mg/dL (ref 5–40)

## 2022-12-13 LAB — COMPREHENSIVE METABOLIC PANEL
AST: 13 IU/L (ref 0–40)
BUN/Creatinine Ratio: 13 (ref 9–23)
Sodium: 142 mmol/L (ref 134–144)
eGFR: 92 mL/min/{1.73_m2} (ref >59)

## 2022-12-13 LAB — LIPOPROTEIN A (LPA)

## 2022-12-16 ENCOUNTER — Telehealth: Payer: Self-pay | Admitting: Cardiology

## 2022-12-16 ENCOUNTER — Other Ambulatory Visit: Payer: Self-pay | Admitting: Family Medicine

## 2022-12-16 DIAGNOSIS — R17 Unspecified jaundice: Secondary | ICD-10-CM

## 2022-12-16 DIAGNOSIS — E785 Hyperlipidemia, unspecified: Secondary | ICD-10-CM

## 2022-12-16 LAB — CBC
MCHC: 32.8 g/dL (ref 31.5–35.7)
MCV: 86 fL (ref 79–97)
RBC: 5.97 x10E6/uL — ABNORMAL HIGH (ref 3.77–5.28)
WBC: 5.1 10*3/uL (ref 3.4–10.8)

## 2022-12-16 LAB — COMPREHENSIVE METABOLIC PANEL
ALT: 18 IU/L (ref 0–32)
Albumin: 4.3 g/dL (ref 3.8–4.9)
Alkaline Phosphatase: 73 IU/L (ref 44–121)
BUN: 10 mg/dL (ref 6–24)
Bilirubin Total: 1.6 mg/dL — ABNORMAL HIGH (ref 0.0–1.2)
CO2: 28 mmol/L (ref 20–29)
Calcium: 9.7 mg/dL (ref 8.7–10.2)
Chloride: 102 mmol/L (ref 96–106)
Creatinine, Ser: 0.75 mg/dL (ref 0.57–1.00)
Globulin, Total: 1.9 g/dL (ref 1.5–4.5)
Glucose: 81 mg/dL (ref 70–99)
Potassium: 4.5 mmol/L (ref 3.5–5.2)
Total Protein: 6.2 g/dL (ref 6.0–8.5)

## 2022-12-16 LAB — LIPID PANEL: LDL Chol Calc (NIH): 147 mg/dL — ABNORMAL HIGH (ref 0–99)

## 2022-12-16 NOTE — Telephone Encounter (Signed)
Patient returned RN's call regarding results. 

## 2022-12-16 NOTE — Telephone Encounter (Signed)
Discussed lab results with patient.  Per Jacolyn Reedy, PA-C: LDL high 147, lipo a not back yet. Bilirubin up some-ask if she has gallbladder problems? Blood count up some as well. Should f/u with PCP for these issues.   Please ask her if she's had the coronary calcium score done yet? This will help determine treatment. Low saturated fat diet and limit processed foods thanks  Lip a elevated. Please refer to lipid clinic thanks    Patient is scheduled for coronary calcium score on 01/27/23.  Lipid Clinic referral placed.  Patient states she does not have a gallbladder, it was removed in 2015.  Labs forwarded to PCP Dr. Nadine Counts.

## 2022-12-22 ENCOUNTER — Other Ambulatory Visit: Payer: BC Managed Care – PPO

## 2022-12-22 DIAGNOSIS — R17 Unspecified jaundice: Secondary | ICD-10-CM

## 2022-12-22 LAB — BILIRUBIN, FRACTIONATED(TOT/DIR/INDIR): Bilirubin Total: 1.8 mg/dL — ABNORMAL HIGH (ref 0.0–1.2)

## 2022-12-23 ENCOUNTER — Other Ambulatory Visit: Payer: Self-pay | Admitting: Family Medicine

## 2022-12-25 ENCOUNTER — Other Ambulatory Visit: Payer: Self-pay | Admitting: Family Medicine

## 2022-12-25 ENCOUNTER — Other Ambulatory Visit: Payer: Self-pay | Admitting: Neurology

## 2022-12-25 ENCOUNTER — Other Ambulatory Visit: Payer: BC Managed Care – PPO

## 2022-12-25 DIAGNOSIS — G2581 Restless legs syndrome: Secondary | ICD-10-CM

## 2022-12-25 DIAGNOSIS — E039 Hypothyroidism, unspecified: Secondary | ICD-10-CM

## 2022-12-25 NOTE — Telephone Encounter (Signed)
[place] NTBS Last TSH 10/2021 NO RFs sent to pharmacy

## 2022-12-25 NOTE — Telephone Encounter (Signed)
Called patient and left a message for a call back.  

## 2022-12-25 NOTE — Telephone Encounter (Signed)
I called pt & she made an appt on 01-01-2023 at 3:30pm w/Dr G. She is completely out of her thyroid meds. Could any be called in until she can come to her appt next week?

## 2022-12-25 NOTE — Addendum Note (Signed)
Addended by: Julious Payer D on: 12/25/2022 03:02 PM   Modules accepted: Orders

## 2022-12-26 ENCOUNTER — Telehealth: Payer: Self-pay

## 2022-12-26 ENCOUNTER — Other Ambulatory Visit: Payer: Self-pay | Admitting: Family Medicine

## 2022-12-26 MED ORDER — CARBIDOPA-LEVODOPA 25-100 MG PO TABS: 1.0000 | ORAL_TABLET | Freq: Every day | ORAL | 3 refills | Status: DC | PRN

## 2022-12-26 MED ORDER — LEVOTHYROXINE SODIUM 112 MCG PO TABS: ORAL_TABLET | ORAL | 3 refills | Status: DC

## 2022-12-26 NOTE — Telephone Encounter (Signed)
Need to inform patient of below:  Call patient and tell her you refilled it for 90 days with 1 refill  (and do that) but after that will need to have PCP fill it if no longer following here.  Document that in chart.  Spoke to patient and informed her of above. Patient stated she would like [place] to cancel her rx because she will be seeing her PCP on Thursday who will be taking over her refills.

## 2023-01-01 ENCOUNTER — Ambulatory Visit: Payer: BC Managed Care – PPO | Admitting: Family Medicine

## 2023-01-02 ENCOUNTER — Ambulatory Visit (HOSPITAL_COMMUNITY): Payer: BC Managed Care – PPO

## 2023-01-07 ENCOUNTER — Ambulatory Visit: Payer: BC Managed Care – PPO | Attending: Internal Medicine | Admitting: Pharmacist

## 2023-01-07 DIAGNOSIS — E782 Mixed hyperlipidemia: Secondary | ICD-10-CM

## 2023-01-07 DIAGNOSIS — E785 Hyperlipidemia, unspecified: Secondary | ICD-10-CM

## 2023-01-07 MED ORDER — ROSUVASTATIN CALCIUM 20 MG PO TABS: 20.0000 mg | ORAL_TABLET | Freq: Every day | ORAL | 3 refills | Status: DC

## 2023-01-07 NOTE — Patient Instructions (Addendum)
Your LDL was 147 and your goal LDL is <100. You have a coronary calcium scan on 01/27/23. If your calcium score comes back greater than zero then we will target a lower LDL goal of <70.  Start taking rosuvastatin 20 mg daily.   Please get follow up cholesterol labs 6-8 weeks after you start taking rosuvastatin.

## 2023-01-07 NOTE — Progress Notes (Signed)
Patient ID: [name]                 DOB: December 25, 1965                    MRN: [id number]     HPI: Candace Rodriguez is a 57 y.o. female patient of Dr Candace Rodriguez referred to lipid clinic by Candace Balloon, PA-C. PMH is significant for PAT, asthma, depression, HTN, HLD, obesity, RLS, OSA. Lipid panel (12/12/22) showed elevated LDL 147 and elevated Lp(a) 109.2, pt referred to PharmD clinic for lipid management. Has coronary CT scheduled for 01/27/23.  Pt presents to clinic today for lipid management. Reports not currently taking cholesterol medications. Tried statins in the past prior to losing 170 lbs. Stopped medications after losing weight as she reports not needing them anymore. Recalled muscle cramps with one statin, likely simvastatin. Prefers not to take medications unless she absolutely needs to. Notes significant cardiac history in her family with an uncle who suddenly died of heart issues, hyperlipidemia in her mother, afib in brother and grandmother. Reports adherence to a healthy diet.  Current Medications: none Intolerances: rosuvastatin 20 mg, simvastatin 40 mg (muscle cramps), ezetimibe 10 mg, niacin (flushing) Risk Factors: HTN, HLD, obesity, prior LDL particle # discordance, elevated lp(a) LDL goal: <100 mg/dl  Diet: no fried foods  Exercise: Works out at a gym 5 days a week for 45 min   Family History: uncle - died of MI, mother- HLD, brother/grandmother- afib  Social History: no tobacco, no alcohol  Labs: 12/12/22 TC 228, TG 98, HDL 64, LDL 147 07/18/21 TC 223, TG 105, HDL 67, LDL 138   Past Medical History:  Diagnosis Date   Asthma    Basal cell carcinoma 04/13/2019   left upper [id number]   Depression    DJD (degenerative joint disease)    Fractures    Left distal radius,  left patella   History of COVID-19 07/01/2019   Hyperlipidemia    Hypertension    Lyme disease    Migraine    Obesity    Obesity, morbid (HCC) 09/13/2012   OSA (obstructive sleep  apnea)    Dx by [facility]   Restless leg syndrome    SCC (squamous cell carcinoma) 04/13/2019   right side [id number]   Sleep apnea    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 18 g 11   Calcium 200 MG TABS Take by mouth 2 (two) times daily.      carbidopa-levodopa (SINEMET IR) 25-100 MG tablet Take 1 tablet by mouth daily as needed. 90 tablet 3   Cholecalciferol (VITAMIN D3 PO) Take 20,000 mg by mouth daily.     clobetasol (TEMOVATE) 0.05 % external solution Apply 1 Application topically daily as needed (dermatitis of scalp). 50 mL 0   dextromethorphan-guaiFENesin (MUCINEX DM) 30-600 MG 12hr tablet Take 1 tablet by mouth 2 (two) times daily. 30 tablet 0   diclofenac (VOLTAREN) 75 MG EC tablet Take 75 mg by mouth 2 (two) times daily.     diphenhydrAMINE HCl (BENADRYL ALLERGY PO) Take by mouth as needed.     Iron,  Ferrous Sulfate, 325 (65 Fe) MG TABS Take 1 tablet by mouth in the morning and at bedtime. 180 tablet 1   levothyroxine (SYNTHROID) 112 MCG tablet TAKE 1 TABLET BY MOUTH EVERY MORNING BEFORE BREAKFAST 90 tablet 3   levothyroxine (SYNTHROID) 125 MCG tablet TAKE 1 TABLET ( ) ON SATURDAYS & SUNDAYS. CONTINUE THE REAINDER OF THE WEEK 24 tablet 3   liothyronine (CYTOMEL) 25 MCG tablet Take by mouth daily. Half in morning half in afternoon     Magnesium Malate POWD 1,500 mg by Does not apply route.     Melatonin 10 MG TABS Take by mouth daily.     Methylcobalamin 82956 MCG SOLR Inject as directed every 7 (seven) days.     metoprolol succinate (TOPROL-XL) 50 MG 24 hr tablet TAKE ONE (1) TABLET BY MOUTH EVERY DAY 90 tablet 3   Multiple Vitamin  (MULTIVITAMIN) tablet Take 1 tablet by mouth 3 (three) times daily. Celebrate Bariatric     Probiotic Product (PROBIOTIC PO) Take by mouth daily.     Rotigotine (NEUPRO) 1 MG/24HR PT24 PLACE 1 PATCH ON ARM PER   DAY 90 patch 1   Rotigotine (NEUPRO) 1 MG/24HR PT24 PLACE 1 PATCH ON ARM PER   DAY 90 patch 0   Semaglutide-Weight Management (WEGOVY) 2.4 MG/0.75ML SOAJ Inject 2.4 mg into the skin.     testosterone enanthate (DELATESTRYL) 200 MG/ML injection Inject into the muscle every 7 (seven) days. For IM use only     vortioxetine HBr (TRINTELLIX) 10 MG TABS tablet Take 1 tablet (10 mg total) by mouth every morning. 90 tablet 3   No current facility-administered medications on file prior to visit.    Allergies  Allergen Reactions   Avelox [Moxifloxacin]    Gluten Meal Other (See Comments)    Joint Pain   Other Other (See Comments)    Dairy intolerance   Penicillin G Itching and Other (See Comments)    Turns red in color.   Penicillins    Quinolones Other (See Comments)    Pt can't remeber   Soy Allergy Rash    Rash in mouth.    Assessment/Plan:  1. Hyperlipidemia - Uncontrolled based on LDL 147 (12/12/22), above goal <100 mg/dl. Coronary CT scheduled for 01/27/23, if score is >0 will plan for more aggressive goal of <70 mg/dl. After discussion of risks of untreated elevated cholesterol and cardiovascular risk reduction benefits of statins she is willing to start a statin although generally likes to avoid taking meds if possible. Plan to start rosuvastatin 20 mg daily as pt has taken in the past and tolerated well. Follow up lipid panel due  6-8 weeks after starting rosuvastatin (pt plans to get labs on 10/4 at [facility]).  Candace Rodriguez, PharmD PGY-1 Pharmacy Resident  Candace Rodriguez, PharmD, BCACP, CPP [facility] [address], [place], [place] [address] Phone: [phone]; Fax: [phone] 01/07/2023 10:14 AM

## 2023-01-16 ENCOUNTER — Ambulatory Visit: Payer: BC Managed Care – PPO | Admitting: Family Medicine

## 2023-01-16 ENCOUNTER — Encounter: Payer: Self-pay | Admitting: Family Medicine

## 2023-01-16 DIAGNOSIS — Z23 Encounter for immunization: Secondary | ICD-10-CM

## 2023-01-16 DIAGNOSIS — E039 Hypothyroidism, unspecified: Secondary | ICD-10-CM

## 2023-01-16 DIAGNOSIS — Z114 Encounter for screening for human immunodeficiency virus [HIV]: Secondary | ICD-10-CM

## 2023-01-16 DIAGNOSIS — Z1159 Encounter for screening for other viral diseases: Secondary | ICD-10-CM

## 2023-01-16 NOTE — Progress Notes (Unsigned)
Subjective: CC:*** PCP: Raliegh Ip, DO WUJ:[id number] R Sternberg is a 57 y.o. female presenting to clinic today for:  1. ***   ROS: Per HPI  Allergies  Allergen Reactions   Avelox [Moxifloxacin]    Gluten Meal Other (See Comments)    Joint Pain   Other Other (See Comments)    Dairy intolerance   Penicillin G Itching and Other (See Comments)    Turns red in color.   Penicillins    Quinolones Other (See Comments)    Pt can't remeber   Soy Allergy Rash    Rash in mouth.   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 , Disp: , Rfl:    carbidopa-levodopa (SINEMET IR) 25-100 MG tablet, Take 1 tablet by mouth daily as needed., Disp: 90 tablet, Rfl: 3   Cholecalciferol (VITAMIN D3 PO), Take 20,000 mg by mouth daily., Disp: , Rfl:    clobetasol (TEMOVATE) 0.05 % external solution, Apply 1 Application topically daily as needed (dermatitis of scalp)., Disp: 50 mL, Rfl: 0   dextromethorphan-guaiFENesin (MUCINEX DM) 30-600 MG 12hr tablet, Take 1 tablet by mouth 2 (two) times daily., Disp: 30 tablet, Rfl: 0   diclofenac  (VOLTAREN) 75 MG EC tablet, Take 75 mg by mouth 2 (two) times daily., Disp: , Rfl:    diphenhydrAMINE HCl (BENADRYL ALLERGY PO), Take by mouth as needed., Disp: , Rfl:    Iron, Ferrous Sulfate, 325 (65 Fe) MG TABS, Take 1 tablet by mouth in the morning and at bedtime., Disp: 180 tablet, Rfl: 1   levothyroxine (SYNTHROID) 112 MCG tablet, TAKE 1 TABLET BY MOUTH EVERY MORNING BEFORE BREAKFAST, Disp: 90 tablet, Rfl: 3   levothyroxine (SYNTHROID) 125 MCG tablet, TAKE 1 TABLET ( ) ON SATURDAYS & SUNDAYS. CONTINUE THE REAINDER OF THE WEEK, Disp: 24 tablet, Rfl: 3   liothyronine (CYTOMEL) 25 MCG tablet, Take by mouth daily. Half in morning half in afternoon, Disp: , Rfl:    Magnesium Malate POWD, 1,500 mg by Does not apply route., Disp: , Rfl:    Melatonin 10 MG TABS, Take by mouth daily., Disp: , Rfl:    Methylcobalamin 78295 MCG SOLR, Inject as directed every 7 (seven) days., Disp: , Rfl:    metoprolol succinate (TOPROL-XL) 50 MG 24 hr tablet, TAKE ONE (1) TABLET BY MOUTH EVERY DAY, Disp: 90 tablet, Rfl: 3   Multiple Vitamin (MULTIVITAMIN) tablet, Take 1 tablet by mouth  3 (three) times daily. Celebrate Bariatric, Disp: , Rfl:    Probiotic Product (PROBIOTIC PO), Take by mouth daily., Disp: , Rfl:    rosuvastatin (CRESTOR) 20 MG tablet, Take 1 tablet (20 mg total) by mouth daily., Disp: 90 tablet, Rfl: 3   Rotigotine (NEUPRO) 1 MG/24HR PT24, PLACE 1 PATCH ON ARM PER   DAY, Disp: 90 patch, Rfl: 1   Rotigotine (NEUPRO) 1 MG/24HR PT24, PLACE 1 PATCH ON ARM PER   DAY, Disp: 90 patch, Rfl: 0   Semaglutide-Weight Management (WEGOVY) 2.4 MG/0.75ML SOAJ, Inject 2.4 mg into the skin., Disp: , Rfl:    testosterone enanthate (DELATESTRYL) 200 MG/ML injection, Inject into the muscle every 7 (seven) days. For IM use only, Disp: , Rfl:    vortioxetine HBr (TRINTELLIX) 10 MG TABS tablet, Take 1 tablet (10 mg total) by mouth every morning., Disp: 90 tablet, Rfl: 3 Social History   Socioeconomic History    Marital status: Married    Spouse name: Not on file   Number of children: Not on file   Years of education: Not on file   Highest education level: Master's degree (e.g., MA, MS, MEng, MEd, MSW, MBA)  Occupational History   Occupation: Facilities manager and Solicitor  Tobacco Use   Smoking status: Former    Current packs/day: 0.00    Average packs/day: 2.0 packs/day for 10.0 years (20.0 ttl pk-yrs)    Types: Cigarettes    Start date: 05/26/1988    Quit date: 05/26/1998    Years since quitting: 24.6    Passive exposure: Never   Smokeless tobacco: Never  Vaping Use   Vaping status: Never Used  Substance and Sexual Activity   Alcohol use: No    Alcohol/week: 0.0 standard drinks of alcohol   Drug use: No   Sexual activity: Not on file  Other Topics Concern   Not on file  Social History Narrative   Right and left handed   Work Doctor, general practice    Social Determinants of Health   Financial Resource Strain: Low Risk  (01/15/2023)   Overall Financial Resource Strain (CARDIA)    Difficulty of Paying Living Expenses: Not hard at all  Food Insecurity: No Food Insecurity (01/15/2023)   Hunger Vital Sign    Worried About Running Out of Food in the Last Year: Never true    Ran Out of Food in the Last Year: Never true  Transportation Needs: No Transportation Needs (01/15/2023)   PRAPARE - Administrator, Civil Service (Medical): No    Lack of Transportation (Non-Medical): No  Physical Activity: Sufficiently Active (01/15/2023)   Exercise Vital Sign    Days of Exercise per Week: 4 days    Minutes of Exercise per Session: 40 min  Stress: No Stress Concern Present (01/15/2023)   [employer] of Occupational Health - Occupational Stress Questionnaire    Feeling of Stress : Only a little  Social Connections: Socially Integrated (01/15/2023)   Social Connection and Isolation Panel [NHANES]    Frequency of Communication with Friends and Family: Three times a week    Frequency of  Social Gatherings with Friends and Family: Twice a week    Attends Religious Services: More than 4 times per year    Active Member of [employer] or Organizations: Yes    Attends Banker Meetings: More than 4 times per year    Marital Status: Married  Catering manager Violence: Not At Risk (08/05/2022)   Received from [facility]  Care, [facility]   Humiliation, Afraid, Rape, and Kick questionnaire    Fear of Current or Ex-Partner: No    Emotionally Abused: No    Physically Abused: No    Sexually Abused: No   Family History  Problem Relation Age of Onset   Arthritis Mother    Arthritis Father    Hypertension Father    Atrial fibrillation Brother    Cancer Maternal Grandmother    Heart disease Maternal Grandmother    Cancer Maternal Grandfather    Cancer Paternal Grandmother    Heart disease Paternal Grandfather     Objective: Office vital signs reviewed. BP 120/72   Pulse 72   Temp 98.8 F (37.1 C)   Ht 5\' 8"  (1.727 m)   Wt 209 lb (94.8 kg)   SpO2 97%   BMI 31.78 kg/m   Physical Examination:  General: Awake, alert, *** nourished, No acute distress HEENT: Normal    Neck: No masses palpated. No lymphadenopathy    Ears: Tympanic membranes intact, normal light reflex, no erythema, no bulging    Eyes: PERRLA, extraocular membranes intact, sclera ***    Nose: nasal turbinates moist, *** nasal discharge    Throat: moist mucus membranes, no erythema, *** tonsillar exudate.  Airway is patent Cardio: regular rate and rhythm, S1S2 heard, no murmurs appreciated Pulm: clear to auscultation bilaterally, no wheezes, rhonchi or rales; normal work of breathing on room air GI: soft, non-tender, non-distended, bowel sounds present x4, no hepatomegaly, no splenomegaly, no masses GU: external vaginal tissue ***, cervix ***, *** punctate lesions on cervix appreciated, *** discharge from cervical os, *** bleeding, *** cervical motion tenderness, *** abdominal/ adnexal  masses Extremities: warm, well perfused, No edema, cyanosis or clubbing; +*** pulses bilaterally MSK: *** gait and *** station Skin: dry; intact; no rashes or lesions Neuro: *** Strength and light touch sensation grossly intact, *** DTRs ***/4  Assessment/ Plan: 57 y.o. female   No diagnosis found.  ***   Tarisha Fader Hulen Skains, DO [facility] [phone]

## 2023-01-17 ENCOUNTER — Encounter: Payer: Self-pay | Admitting: Family Medicine

## 2023-01-17 ENCOUNTER — Other Ambulatory Visit: Payer: Self-pay | Admitting: Family Medicine

## 2023-01-17 DIAGNOSIS — D582 Other hemoglobinopathies: Secondary | ICD-10-CM

## 2023-01-17 DIAGNOSIS — R718 Other abnormality of red blood cells: Secondary | ICD-10-CM

## 2023-01-17 LAB — CBC WITH DIFFERENTIAL/PLATELET
Basophils Absolute: 0.1 10*3/uL (ref 0.0–0.2)
Basos: 1 %
EOS (ABSOLUTE): 0.2 10*3/uL (ref 0.0–0.4)
Eos: 3 %
Hematocrit: 48.1 % — ABNORMAL HIGH (ref 34.0–46.6)
Hemoglobin: 16.2 g/dL — ABNORMAL HIGH (ref 11.1–15.9)
Immature Grans (Abs): 0 10*3/uL (ref 0.0–0.1)
Immature Granulocytes: 0 %
Lymphocytes Absolute: 2.2 10*3/uL (ref 0.7–3.1)
Lymphs: 36 %
MCH: 28.2 pg (ref 26.6–33.0)
MCHC: 33.7 g/dL (ref 31.5–35.7)
MCV: 84 fL (ref 79–97)
Monocytes Absolute: 0.4 10*3/uL (ref 0.1–0.9)
Monocytes: 6 %
Neutrophils Absolute: 3.3 10*3/uL (ref 1.4–7.0)
Neutrophils: 54 %
Platelets: 219 10*3/uL (ref 150–450)
RBC: 5.74 x10E6/uL — ABNORMAL HIGH (ref 3.77–5.28)
RDW: 12.7 % (ref 11.7–15.4)
WBC: 6 10*3/uL (ref 3.4–10.8)

## 2023-01-17 LAB — BILIRUBIN, FRACTIONATED(TOT/DIR/INDIR)
Bilirubin Total: 1.8 mg/dL — ABNORMAL HIGH (ref 0.0–1.2)
Bilirubin, Direct: 0.42 mg/dL — ABNORMAL HIGH (ref 0.00–0.40)
Bilirubin, Indirect: 1.38 mg/dL — ABNORMAL HIGH (ref 0.10–0.80)

## 2023-01-17 LAB — HIV ANTIBODY (ROUTINE TESTING W REFLEX): HIV Screen 4th Generation wRfx: NONREACTIVE

## 2023-01-17 LAB — HEPATITIS C ANTIBODY: Hep C Virus Ab: NONREACTIVE

## 2023-01-20 ENCOUNTER — Telehealth: Payer: Self-pay | Admitting: Family Medicine

## 2023-01-20 ENCOUNTER — Ambulatory Visit (HOSPITAL_COMMUNITY)
Admission: RE | Admit: 2023-01-20 | Discharge: 2023-01-20 | Disposition: A | Discharge status: Home / Self Care | Payer: BC Managed Care – PPO | Source: Ambulatory Visit | Attending: Family Medicine | Admitting: Family Medicine

## 2023-01-20 DIAGNOSIS — R718 Other abnormality of red blood cells: Secondary | ICD-10-CM | POA: Diagnosis present

## 2023-01-20 DIAGNOSIS — D582 Other hemoglobinopathies: Secondary | ICD-10-CM | POA: Diagnosis present

## 2023-01-20 NOTE — Telephone Encounter (Signed)
She just had her RUQ u/s today.  Can you please make sure they get my notes and the results of this u/s?

## 2023-01-20 NOTE — Telephone Encounter (Signed)
[facility] called stating that they need pts [place] results and more clinical documentation on the referral they received for pt.   Fax# [phone] Attn: Theresia Lo

## 2023-01-21 NOTE — Telephone Encounter (Signed)
Faxed a copy of [place] to GI as requested :)

## 2023-01-27 ENCOUNTER — Ambulatory Visit (HOSPITAL_COMMUNITY)
Admission: RE | Admit: 2023-01-27 | Discharge: 2023-01-27 | Disposition: A | Discharge status: Home / Self Care | Payer: Self-pay | Source: Ambulatory Visit | Attending: Physician Assistant | Admitting: Physician Assistant

## 2023-01-27 DIAGNOSIS — E785 Hyperlipidemia, unspecified: Secondary | ICD-10-CM | POA: Insufficient documentation

## 2023-01-28 ENCOUNTER — Telehealth: Payer: Self-pay | Admitting: Physician Assistant

## 2023-01-28 DIAGNOSIS — E782 Mixed hyperlipidemia: Secondary | ICD-10-CM

## 2023-01-28 DIAGNOSIS — E785 Hyperlipidemia, unspecified: Secondary | ICD-10-CM

## 2023-01-28 NOTE — Telephone Encounter (Signed)
Patient notified and verbalized understanding. 

## 2023-01-28 NOTE — Telephone Encounter (Signed)
-----   Message from Jacolyn Reedy sent at 01/28/2023  8:18 AM EDT ----- Calcium score high 191. LDL, lipo(a) all high. Please refer to lipid clinic for management. Also had elevated bilirubins in July/Aug.

## 2023-01-28 NOTE — Telephone Encounter (Signed)
Pt was returning nurse Roseanne Reno, CMA call regarding results and is requesting a callback. Please advise

## 2023-01-30 ENCOUNTER — Encounter: Payer: Self-pay | Admitting: Oncology

## 2023-01-30 ENCOUNTER — Inpatient Hospital Stay: Payer: BC Managed Care – PPO | Attending: Oncology | Admitting: Oncology

## 2023-01-30 ENCOUNTER — Inpatient Hospital Stay: Payer: BC Managed Care – PPO

## 2023-01-30 VITALS — BP 130/85 | HR 76 | Temp 97.9°F | Resp 16 | Ht 68.0 in | Wt 209.7 lb

## 2023-01-30 DIAGNOSIS — E669 Obesity, unspecified: Secondary | ICD-10-CM | POA: Diagnosis not present

## 2023-01-30 DIAGNOSIS — G2581 Restless legs syndrome: Secondary | ICD-10-CM | POA: Insufficient documentation

## 2023-01-30 DIAGNOSIS — E039 Hypothyroidism, unspecified: Secondary | ICD-10-CM | POA: Insufficient documentation

## 2023-01-30 DIAGNOSIS — D751 Secondary polycythemia: Secondary | ICD-10-CM | POA: Diagnosis not present

## 2023-01-30 DIAGNOSIS — G4733 Obstructive sleep apnea (adult) (pediatric): Secondary | ICD-10-CM | POA: Insufficient documentation

## 2023-01-30 DIAGNOSIS — I10 Essential (primary) hypertension: Secondary | ICD-10-CM | POA: Diagnosis not present

## 2023-01-30 DIAGNOSIS — Z79899 Other long term (current) drug therapy: Secondary | ICD-10-CM | POA: Insufficient documentation

## 2023-01-30 DIAGNOSIS — F320 Major depressive disorder, single episode, mild: Secondary | ICD-10-CM | POA: Insufficient documentation

## 2023-01-30 LAB — CBC WITH DIFFERENTIAL/PLATELET
Abs Immature Granulocytes: 0.01 10*3/uL (ref 0.00–0.07)
Basophils Absolute: 0 10*3/uL (ref 0.0–0.1)
Basophils Relative: 1 %
Eosinophils Absolute: 0.2 10*3/uL (ref 0.0–0.5)
Eosinophils Relative: 3 %
HCT: 48.4 % — ABNORMAL HIGH (ref 36.0–46.0)
Hemoglobin: 16.7 g/dL — ABNORMAL HIGH (ref 12.0–15.0)
Immature Granulocytes: 0 %
Lymphocytes Relative: 28 %
Lymphs Abs: 2 10*3/uL (ref 0.7–4.0)
MCH: 28.5 pg (ref 26.0–34.0)
MCHC: 34.5 g/dL (ref 30.0–36.0)
MCV: 82.7 fL (ref 80.0–100.0)
Monocytes Absolute: 0.4 10*3/uL (ref 0.1–1.0)
Monocytes Relative: 6 %
Neutro Abs: 4.6 10*3/uL (ref 1.7–7.7)
Neutrophils Relative %: 62 %
Platelets: 214 10*3/uL (ref 150–400)
RBC: 5.85 MIL/uL — ABNORMAL HIGH (ref 3.87–5.11)
RDW: 11.9 % (ref 11.5–15.5)
WBC: 7.3 10*3/uL (ref 4.0–10.5)
nRBC: 0 % (ref 0.0–0.2)

## 2023-01-30 LAB — IRON AND TIBC
Iron: 117 ug/dL (ref 28–170)
Saturation Ratios: 33 % — ABNORMAL HIGH (ref 10.4–31.8)
TIBC: 350 ug/dL (ref 250–450)
UIBC: 233 ug/dL

## 2023-01-30 LAB — FERRITIN: Ferritin: 49 ng/mL (ref 11–307)

## 2023-01-30 LAB — VITAMIN D 25 HYDROXY (VIT D DEFICIENCY, FRACTURES): Vit D, 25-Hydroxy: 95.9 ng/mL (ref 30–100)

## 2023-01-30 NOTE — Assessment & Plan Note (Addendum)
Patient has recent elevation of indirect bilirubin with total bilirubin less than 2 Right upper quadrant ultrasound did not show any significant changes Will look at the peripheral smear to rule out hemolysis, even though this is low on differential considering patient has increased hemoglobin recently I highly suspect that this hyperbilirubinemia is coming from medication use with testosterone and rosuvastatin as main culprits.  Since patient is asymptomatic I do not strongly to discontinue either of them at this point. Patient also had increased bilirubin many years prior and was thought to be due to antibiotic use over-the-counter Patient can continue to monitor the levels with primary care come back to [place] if there is a significant change and needs further evaluation.  Addendum: peripheral smear reviewed. No schistocytes or bite cells seen.

## 2023-01-30 NOTE — Progress Notes (Signed)
Physician wanted patient called to let her know that her Iron levels are normal and she can discontinue Iron if she wishes and Dr. Anders Simmonds looked at her blood under microscope and did not find any concerning features.  Patient called and made aware of Physicians notes on Iron levels and blood smear.

## 2023-01-30 NOTE — Patient Instructions (Signed)
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

## 2023-01-30 NOTE — Progress Notes (Addendum)
[facility] at [facility] HEMATOLOGY NEW VISIT  Candace Rodriguez M, [place]  REASON FOR REFERRAL: Hyperbilirubinemia and erythrocytosis  SUMMARY OF HEMATOLOGIC HISTORY:    Latest Ref Rng & Units 01/30/2023   10:38 AM 01/16/2023   12:16 PM 12/12/2022    7:42 AM  CBC  WBC 4.0 - 10.5 K/uL 7.3  6.0  5.1   Hemoglobin 12.0 - 15.0 g/dL 78.2  95.6  21.3   Hematocrit 36.0 - 46.0 % 48.4  48.1  51.2   Platelets 150 - 400 K/uL 214  219  236     Latest Reference Range & Units 04/20/13 08:20 11/07/13 09:06 05/15/14 14:31 06/12/14 10:44 12/19/14 11:53 11/02/15 08:03 11/05/15 17:59 12/28/15 15:14 11/05/16 08:04 09/04/17 09:51 02/21/21 12:16 07/18/21 08:03 12/12/22 07:42 12/22/22 09:18 01/16/23 12:16  Indirect Bilirubin 0.10 - 0.80 mg/dL              0.86 (H) 5.78 (H)  Total Bilirubin 0.0 - 1.2 mg/dL 1.1 2.5 (H) 1.0 1.4 (H) 0.7 2.5 (H) 2.2 (H) 0.5 1.0 0.8 0.7 0.9 1.6 (H) 1.8 (H) 1.8 (H)  BILIRUBIN, DIRECT 0.00 - 0.40 mg/dL       4.69 (H)       6.29 0.42 (H)  (H): Data is abnormally high  HISTORY OF PRESENT ILLNESS: Candace Rodriguez 57 y.o. female referred for erythrocytosis and hyperbilirubinemia.  Patient has a past medical history of Restless leg syndrome, hypothyroidism, hypertension, obesity.  Presented today referred by primary care for hyperbilirubinemia and erythrocytosis.  She has no complaints today doing very well overall.  She reported that she has some fatigue but it is at baseline.  She denies abdominal pain, jaundice, nausea, vomiting, weight loss, loss of appetite.  She also denied rashes, itching after shower, palpitations.  She she had a history of OSA and was using CPAP machine and was retested 2 years ago onset does not have OSA anymore.  She takes iron supplementation for restless leg syndrome.  Patient has been taking testosterone injections weekly for the past 1 year to help with fatigue, sleeplessness and has seen significant improvement.   Patient is a  non-smoker nonalcoholic.  No family history of blood conditions.  We reviewed the labs together.  Her erythrocytosis actually coincides with testosterone supplementation and I think it is because of that.  Anabolic steroids [testosterone] can also cause elevation in direct bilirubin and likely is one of the causes for her hyperbilirubinemia.  Since patient is not symptomatic and is feeling better with testosterone, we discussed that there is no real necessity to discontinue that at this time.  All questions and concerns were answered.  I have reviewed the past medical history, past surgical history, social history and family history with the patient   ALLERGIES:  is allergic to soy allergy, moxifloxacin, gluten meal, other, penicillin g, penicillins, and quinolones.  MEDICATIONS:  Current Outpatient Medications  Medication Sig Dispense Refill   albuterol (VENTOLIN HFA) 108 (90 Base) MCG/ACT inhaler Inhale 2 puffs into the lungs every 6 (six) hours as needed for wheezing or shortness of breath. 18 g 11   Calcium 200 MG TABS Take by mouth 2 (two) times daily.      carbidopa-levodopa (SINEMET IR) 25-100 MG tablet Take 1 tablet by mouth daily as needed. 90 tablet 3   Cholecalciferol (VITAMIN D3 PO) Take 20,000 mg by mouth daily.     clobetasol (TEMOVATE) 0.05 % external solution Apply 1 Application topically daily as needed (  dermatitis of scalp). 50 mL 0   dextromethorphan-guaiFENesin (MUCINEX DM) 30-600 MG 12hr tablet Take 1 tablet by mouth 2 (two) times daily. 30 tablet 0   diclofenac (VOLTAREN) 75 MG EC tablet Take 75 mg by mouth 2 (two) times daily.     diphenhydrAMINE HCl (BENADRYL ALLERGY PO) Take by mouth as needed.     Iron, Ferrous Sulfate, 325 (65 Fe) MG TABS Take 1 tablet by mouth in the morning and at bedtime. 180 tablet 1   levothyroxine (SYNTHROID) 112 MCG tablet TAKE 1 TABLET BY MOUTH EVERY MORNING BEFORE BREAKFAST 90 tablet 3   levothyroxine (SYNTHROID) 125 MCG tablet TAKE 1 TABLET  ( ) ON SATURDAYS & SUNDAYS. CONTINUE THE REAINDER OF THE WEEK 24 tablet 3   liothyronine (CYTOMEL) 25 MCG tablet Take by mouth daily. Half in morning half in afternoon     Magnesium Malate POWD 1,500 mg by Does not apply route.     Melatonin 10 MG TABS Take by mouth daily.     Methylcobalamin 29528 MCG SOLR Inject as directed every 7 (seven) days.     metoprolol succinate (TOPROL-XL) 50 MG 24 hr tablet TAKE ONE (1) TABLET BY MOUTH EVERY DAY 90 tablet 3   Multiple Vitamin (MULTIVITAMIN) tablet Take 1 tablet by mouth 3 (three) times daily. Celebrate Bariatric     Probiotic Product (PROBIOTIC PO) Take by mouth daily.     rosuvastatin (CRESTOR) 20 MG tablet Take 1 tablet (20 mg total) by mouth daily. 90 tablet 3   Rotigotine (NEUPRO) 1 MG/24HR PT24 PLACE 1 PATCH ON ARM PER   DAY 90 patch 1   Rotigotine (NEUPRO) 1 MG/24HR PT24 PLACE 1 PATCH ON ARM PER   DAY 90 patch 0   Semaglutide-Weight Management (WEGOVY) 2.4 MG/0.75ML SOAJ Inject 2.4 mg into the skin.     testosterone enanthate (DELATESTRYL) 200 MG/ML injection Inject into the muscle every 7 (seven) days. For IM use only     vortioxetine HBr (TRINTELLIX) 10 MG TABS tablet Take 1 tablet (10 mg total) by mouth every morning. 90 tablet 3   No current facility-administered medications for this visit.     REVIEW OF SYSTEMS:   Constitutional: Denies fevers, chills or night sweats Eyes: Denies blurriness of vision Ears, nose, mouth, throat, and face: Denies mucositis or sore throat Respiratory: Denies cough, dyspnea or wheezes Cardiovascular: Denies palpitation, chest discomfort or lower extremity swelling Gastrointestinal:  Denies nausea, heartburn or change in bowel habits Skin: Denies abnormal skin rashes Lymphatics: Denies new lymphadenopathy or easy bruising Neurological:Denies numbness, tingling or new weaknesses Behavioral/Psych: Mood is stable, no new changes  All other systems were reviewed with the patient and are  negative.  PHYSICAL EXAMINATION:   Vitals:   01/30/23 0948  BP: 130/85  Pulse: 76  Resp: 16  Temp: 97.9 F (36.6 C)  SpO2: 98%    GENERAL:alert, no distress and comfortable SKIN: skin color, texture, turgor are normal, no rashes or significant lesions LUNGS: clear to auscultation and percussion with normal breathing effort HEART: regular rate & rhythm and no murmurs and no lower extremity edema ABDOMEN:abdomen soft, non-tender and normal bowel sounds.  Bruise on the left side from semaglutide shot, a small incision site from recent biopsy. Musculoskeletal:no cyanosis of digits and no clubbing  NEURO: alert & oriented x 3 with fluent speech, no focal motor/sensory deficits  LABORATORY DATA:  I have reviewed the data as listed  Lab Results  Component Value Date   WBC 7.3 01/30/2023  NEUTROABS 4.6 01/30/2023   HGB 16.7 (H) 01/30/2023   HCT 48.4 (H) 01/30/2023   MCV 82.7 01/30/2023   PLT 214 01/30/2023      Component Value Date/Time   NA 142 12/12/2022 0742   K 4.5 12/12/2022 0742   CL 102 12/12/2022 0742   CO2 28 12/12/2022 0742   GLUCOSE 81 12/12/2022 0742   GLUCOSE 115 (H) 11/15/2008 0345   BUN 10 12/12/2022 0742   CREATININE 0.75 12/12/2022 0742   CALCIUM 9.7 12/12/2022 0742   PROT 6.2 12/12/2022 0742   ALBUMIN 4.3 12/12/2022 0742   AST 13 12/12/2022 0742   ALT 18 12/12/2022 0742   ALKPHOS 73 12/12/2022 0742   BILITOT 1.8 (H) 01/16/2023 1216   GFRNONAA 103 09/04/2017 0951   GFRAA 119 09/04/2017 0951       Chemistry      Component Value Date/Time   NA 142 12/12/2022 0742   K 4.5 12/12/2022 0742   CL 102 12/12/2022 0742   CO2 28 12/12/2022 0742   BUN 10 12/12/2022 0742   CREATININE 0.75 12/12/2022 0742   GLU 92 05/24/2012 0000      Component Value Date/Time   CALCIUM 9.7 12/12/2022 0742   ALKPHOS 73 12/12/2022 0742   AST 13 12/12/2022 0742   ALT 18 12/12/2022 0742   BILITOT 1.8 (H) 01/16/2023 1216      Latest Reference Range & Units 04/20/13  08:20 11/07/13 09:06 05/15/14 14:31 06/12/14 10:44 12/19/14 11:53 11/02/15 08:03 11/05/15 17:59 12/28/15 15:14 11/05/16 08:04 09/04/17 09:51 02/21/21 12:16 07/18/21 08:03 12/12/22 07:42 12/22/22 09:18 01/16/23 12:16  Indirect Bilirubin 0.10 - 0.80 mg/dL              1.32 (H) 4.40 (H)  Total Bilirubin 0.0 - 1.2 mg/dL 1.1 2.5 (H) 1.0 1.4 (H) 0.7 2.5 (H) 2.2 (H) 0.5 1.0 0.8 0.7 0.9 1.6 (H) 1.8 (H) 1.8 (H)  BILIRUBIN, DIRECT 0.00 - 0.40 mg/dL       1.02 (H)       7.25 0.42 (H)  (H): Data is abnormally high  RADIOGRAPHIC STUDIES: Right upper quadrant ultrasound on 01/20/2023: IMPRESSION: Previous cholecystectomy.  No ductal dilatation.   ASSESSMENT & PLAN:  Patient is a 57 year old female referred for hyperbilirubinemia and erythrocytosis  Hyperbilirubinemia Patient has recent elevation of indirect bilirubin with total bilirubin less than 2 Right upper quadrant ultrasound did not show any significant changes Will look at the peripheral smear to rule out hemolysis, even though this is low on differential considering patient has increased hemoglobin recently I highly suspect that this hyperbilirubinemia is coming from medication use with testosterone and rosuvastatin as main culprits.  Since patient is asymptomatic I do not strongly to discontinue either of them at this point. Patient also had increased bilirubin many years prior and was thought to be due to antibiotic use over-the-counter Patient can continue to monitor the levels with primary care come back to [place] if there is a significant change and needs further evaluation.  Addendum: peripheral smear reviewed. No schistocytes or bite cells seen.  Erythrocytosis Patient has mildly elevated hemoglobin, likely secondary to testosterone use Will obtain EPO level just to rule out other causes Erythrocytosis is only slightly higher than upper limit of normal and would recommend primary care to continue to monitor No indication for phlebotomy at  this time If it continues to rise up with hematocrit going about 47, can reassess the patient in the clinic again   Orders Placed This Encounter  Procedures   Erythropoietin    Standing Status:   Future    Number of Occurrences:   1    Standing Expiration Date:   01/30/2024   Ferritin    Standing Status:   Future    Number of Occurrences:   1    Standing Expiration Date:   01/30/2024   Iron and TIBC    Standing Status:   Future    Number of Occurrences:   1    Standing Expiration Date:   01/30/2024   CBC with Differential/Platelet    Standing Status:   Future    Number of Occurrences:   1    Standing Expiration Date:   01/30/2024   Vitamin D 25 hydroxy    Standing Status:   Future    Number of Occurrences:   1    Standing Expiration Date:   01/30/2024    The total time spent in the appointment was 45 minutes encounter with patients including review of chart and various tests results, discussions about plan of care and coordination of care plan   All questions were answered. The patient knows to call the clinic with any problems, questions or concerns. No barriers to learning was detected.   Cindie Crumbly, MD 9/6/20241:25 PM

## 2023-01-30 NOTE — Assessment & Plan Note (Signed)
Patient has mildly elevated hemoglobin, likely secondary to testosterone use Will obtain EPO level just to rule out other causes Erythrocytosis is only slightly higher than upper limit of normal and would recommend primary care to continue to monitor No indication for phlebotomy at this time If it continues to rise up with hematocrit going about 47, can reassess the patient in the clinic again

## 2023-01-31 LAB — ERYTHROPOIETIN: Erythropoietin: 6.8 m[IU]/mL (ref 2.6–18.5)

## 2023-02-17 ENCOUNTER — Encounter: Payer: Self-pay | Admitting: Family Medicine

## 2023-02-23 ENCOUNTER — Other Ambulatory Visit: Payer: Self-pay | Admitting: Neurology

## 2023-02-23 DIAGNOSIS — G2581 Restless legs syndrome: Secondary | ICD-10-CM

## 2023-02-26 ENCOUNTER — Other Ambulatory Visit: Payer: Self-pay | Admitting: Cardiology

## 2023-03-02 ENCOUNTER — Encounter: Payer: Self-pay | Admitting: Family Medicine

## 2023-03-02 ENCOUNTER — Other Ambulatory Visit: Payer: Self-pay | Admitting: Family Medicine

## 2023-03-02 DIAGNOSIS — G2581 Restless legs syndrome: Secondary | ICD-10-CM

## 2023-03-02 MED ORDER — NEUPRO 1 MG/24HR TD PT24
MEDICATED_PATCH | TRANSDERMAL | 3 refills | Status: DC
Start: 2023-03-02 — End: 2024-01-19

## 2023-03-03 LAB — NMR, LIPOPROFILE
Cholesterol, Total: 153 mg/dL (ref 100–199)
HDL Particle Number: 35.4 umol/L (ref >=30.5)
HDL-C: 65 mg/dL (ref >39)
LDL Particle Number: 1073 nmol/L — ABNORMAL HIGH (ref <1000)
LDL Size: 20.7 nm (ref >20.5)
LDL-C (NIH Calc): 74 mg/dL (ref 0–99)
Small LDL Particle Number: 498 nmol/L (ref <=527)
Triglycerides: 70 mg/dL (ref 0–149)

## 2023-03-03 LAB — HEPATIC FUNCTION PANEL
ALT: 32 IU/L (ref 0–32)
AST: 25 IU/L (ref 0–40)
Albumin: 4.4 g/dL (ref 3.8–4.9)
Alkaline Phosphatase: 68 IU/L (ref 44–121)
Bilirubin Total: 1.8 mg/dL — ABNORMAL HIGH (ref 0.0–1.2)
Bilirubin, Direct: 0.38 mg/dL (ref 0.00–0.40)
Total Protein: 6.3 g/dL (ref 6.0–8.5)

## 2023-03-03 LAB — APOLIPOPROTEIN B: Apolipoprotein B: 72 mg/dL (ref <90)

## 2023-04-09 ENCOUNTER — Encounter: Payer: Self-pay | Admitting: Family Medicine

## 2023-05-08 ENCOUNTER — Other Ambulatory Visit: Payer: Self-pay | Admitting: Family Medicine

## 2023-05-08 DIAGNOSIS — F320 Major depressive disorder, single episode, mild: Secondary | ICD-10-CM

## 2023-05-15 ENCOUNTER — Other Ambulatory Visit: Payer: Self-pay | Admitting: Family Medicine

## 2023-05-15 DIAGNOSIS — E039 Hypothyroidism, unspecified: Secondary | ICD-10-CM

## 2023-08-25 ENCOUNTER — Telehealth: Payer: Self-pay

## 2023-08-25 NOTE — Telephone Encounter (Signed)
   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

## 2023-08-31 ENCOUNTER — Encounter: Payer: Self-pay | Admitting: Family Medicine

## 2023-08-31 ENCOUNTER — Ambulatory Visit: Admitting: Family Medicine

## 2023-08-31 VITALS — BP 123/76 | HR 79 | Temp 98.6°F | Ht 68.0 in | Wt 251.0 lb

## 2023-08-31 DIAGNOSIS — Z01818 Encounter for other preprocedural examination: Secondary | ICD-10-CM | POA: Diagnosis not present

## 2023-08-31 DIAGNOSIS — M1711 Unilateral primary osteoarthritis, right knee: Secondary | ICD-10-CM

## 2023-08-31 DIAGNOSIS — G4733 Obstructive sleep apnea (adult) (pediatric): Secondary | ICD-10-CM | POA: Diagnosis not present

## 2023-08-31 DIAGNOSIS — I4719 Other supraventricular tachycardia: Secondary | ICD-10-CM | POA: Diagnosis not present

## 2023-08-31 DIAGNOSIS — I10 Essential (primary) hypertension: Secondary | ICD-10-CM

## 2023-08-31 NOTE — Progress Notes (Signed)
 Pt is a 58 y.o. female who is here for preoperative clearance for conversion of right partial knee to total right knee with [facility], Dr. Charlann Boxer.  This is planned for November 05, 2023.  She is getting cardiac clearance but was actually told by her cardiologist nothing special needed be done they just need to send the paperwork to them.  She reports no chest pain, shortness of breath, change in exercise tolerance.  She does report history of postop nausea and vomiting even with spinal.  When she had repeat surgery they lessened the amount of medication that she was given and that improved her postop nausea and vomiting.  She has had no issues with healing in the past.  1) High Risk Cardiac Conditions  1) Recent MI - No.  2) Decompensated Heart Failure - No.  3) Unstable angina - No.  4) Symptomatic arrythmia - No.  5) Sx Valvular Disease - No.  2) Intermediate Risk Factors - DM, CKD, CVA, CHF, CAD - No.  2) Functional Status - > 4 mets (Walk, run, climb stairs) Yes.    3) Surgery Specific Risk -   Intermediate (Orthopaedic )         4) Further Noninvasive evaluation -   1) EKG - Yes.     1) Hx of paroxysmal atrial tachycardia  2) Echo - No.   1) Worsening dyspnea   3) Stress Testing - Active Cardiac Disease - No.  5) Need for medical therapy - Beta Blocker, Statins indicated ? No.  PE: Vitals:   08/31/23 1452  BP: 123/76  Pulse: 79  Temp: 98.6 F (37 C)  SpO2: 96%   Physical Examination: General appearance - alert, well appearing, and in no distress Mental status - alert, oriented to person, place, and time Eyes - pupils equal and reactive, extraocular eye movements intact Mouth - mucous membranes moist, pharynx normal without lesions and Mallampati 1 Mallampati 1 Neck - supple, no significant adenopathy Chest - clear to auscultation, no wheezes, rales or rhonchi, symmetric air entry Heart - normal rate, regular rhythm, normal S1, S2, no murmurs, rubs, clicks or  gallops Musculoskeletal - ambulating independently  Preop examination - Plan: EKG 12-Lead, CBC, Basic Metabolic Panel  Primary osteoarthritis of right knee  Essential hypertension, benign - Plan: EKG 12-Lead, CBC, Basic Metabolic Panel  Paroxysmal atrial tachycardia (HCC) - Plan: EKG 12-Lead, CBC, Basic Metabolic Panel  OSA (obstructive sleep apnea) - Plan: EKG 12-Lead, CBC, Basic Metabolic Panel  I have independently evaluated patient.  Candace Rodriguez is a 58 y.o. female who is LOW risk for an INTERMEDIATE risk surgery.  There are modifiable risk factors (weight loss). Candace Rodriguez's RCRI calculation for MACE is: 0.    Okay to proceed with right total knee.  Labs collected we will fax these along with her preop clearance.  Obstructive sleep apnea was reportedly resolved after she lost weight initially.  EKG unremarkable today.  Will CC to her cardiologist as Candace Alken M. Nadine Counts, DO [facility]

## 2023-09-01 ENCOUNTER — Encounter: Payer: Self-pay | Admitting: Family Medicine

## 2023-09-01 LAB — BASIC METABOLIC PANEL WITH GFR
BUN/Creatinine Ratio: 24 — ABNORMAL HIGH (ref 9–23)
BUN: 18 mg/dL (ref 6–24)
CO2: 23 mmol/L (ref 20–29)
Calcium: 9.4 mg/dL (ref 8.7–10.2)
Chloride: 103 mmol/L (ref 96–106)
Creatinine, Ser: 0.74 mg/dL (ref 0.57–1.00)
Glucose: 57 mg/dL — ABNORMAL LOW (ref 70–99)
Potassium: 4.3 mmol/L (ref 3.5–5.2)
Sodium: 141 mmol/L (ref 134–144)
eGFR: 94 mL/min/{1.73_m2} (ref >59)

## 2023-09-01 LAB — CBC
Hematocrit: 44.2 % (ref 34.0–46.6)
Hemoglobin: 14.5 g/dL (ref 11.1–15.9)
MCH: 26.5 pg — ABNORMAL LOW (ref 26.6–33.0)
MCHC: 32.8 g/dL (ref 31.5–35.7)
MCV: 81 fL (ref 79–97)
Platelets: 249 10*3/uL (ref 150–450)
RBC: 5.47 x10E6/uL — ABNORMAL HIGH (ref 3.77–5.28)
RDW: 14 % (ref 11.7–15.4)
WBC: 6.4 10*3/uL (ref 3.4–10.8)

## 2023-09-08 NOTE — Progress Notes (Signed)
 Office Visit Note  Patient: Candace Rodriguez             Date of Birth: 1966/04/16           MRN: [id number]             PCP: Eliodoro Guerin, DO Referring: Eliodoro Guerin, DO Visit Date: 09/22/2023 Occupation: @GUAROCC @  Subjective:  Pain in shoulders, hands, feet, and knees   History of Present Illness: RACHYL EDSALL is a 58 y.o. female with osteoarthritis, degenerative disc disease, gout and osteopenia.  She returns today after her last visit in April 2024.  She denies having any episodes of gouty arthropathy.  She is not taking any medications for gout.  She continues to have pain and discomfort in her shoulders, hands, knee joints and lower back.  She had left shoulder joint injection in April 2024 which helped.  She states lifting weights exacerbates her shoulder joint discomfort.  She is scheduled to have right total knee replacement in June 2025.  She is having more trouble with right knee joint and feels instability.  She is going to the chiropractor for lower back pain.    Activities of Daily Living:  Patient reports morning stiffness for .   Patient Reports nocturnal pain.  Difficulty dressing/grooming: Denies Difficulty climbing stairs: Reports Difficulty getting out of chair: Reports Difficulty using hands for taps, buttons, cutlery, and/or writing: Denies  Review of Systems  Constitutional:  Negative for fatigue.  HENT:  Negative for mouth sores and mouth dryness.   Eyes:  Negative for dryness.  Respiratory:  Negative for shortness of breath.   Cardiovascular:  Negative for chest pain and palpitations.  Gastrointestinal:  Negative for blood in stool, constipation and diarrhea.  Endocrine: Negative for increased urination.  Genitourinary:  Negative for involuntary urination.  Musculoskeletal:  Positive for joint pain, joint pain, joint swelling, myalgias, morning stiffness and myalgias. Negative for gait problem, muscle weakness and muscle  tenderness.  Skin:  Negative for color change, rash, hair loss and sensitivity to sunlight.  Allergic/Immunologic: Negative for susceptible to infections.  Neurological:  Negative for dizziness and headaches.  Hematological:  Negative for swollen glands.  Psychiatric/Behavioral:  Negative for depressed mood and sleep disturbance. The patient is not nervous/anxious.     PMFS History:  Patient Active Problem List   Diagnosis Date Noted   Depression, major, single episode, mild (HCC) 01/30/2023   Erythrocytosis 01/30/2023   Paroxysmal atrial tachycardia (HCC) 01/03/2022   Primary osteoarthritis of both hands 08/16/2018   DDD (degenerative disc disease), lumbar 08/16/2018   History of restless legs syndrome 07/24/2016   History of migraine 07/15/2016   History of hypothyroidism 07/15/2016   History of partial knee replacement 07/15/2016   Hyperbilirubinemia 11/05/2015   Chronic venous insufficiency 10/15/2015   Healthcare maintenance 10/15/2015   OSA (obstructive sleep apnea) 10/25/2014   PCOS (polycystic ovarian syndrome) 06/12/2014   Peripheral edema 06/12/2014   Postprandial RUQ pain 12/07/2013   Asthma 11/16/2012   Morbid obesity with BMI of 50.0-59.9, adult (HCC) 11/16/2012   Hyperlipidemia 09/13/2012   Essential hypertension, benign 09/13/2012   Hypothyroidism 09/13/2012   Restless leg syndrome, controlled 09/13/2012   Migraine headache 09/13/2012   Gout 09/13/2012    Past Medical History:  Diagnosis Date   Asthma    Basal cell carcinoma 04/13/2019   left upper back-[id number]   Depression    DJD (degenerative joint disease)    Fractures    Left distal  radius,  left patella   History of COVID-19 07/01/2019   Hyperlipidemia    Hypertension    Lyme disease    Migraine    Obesity    Obesity, morbid (HCC) 09/13/2012   OSA (obstructive sleep apnea)    Dx by [facility]   Restless leg syndrome    SCC (squamous cell carcinoma) 04/13/2019   right side  [id number]   Sleep apnea    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   left [id number]   Thyroid  disease    Tinnitus     Family History  Problem Relation Age of Onset   Arthritis Mother    Arthritis Father    Hypertension Father    Atrial fibrillation Brother    Cancer Maternal Grandmother    Heart disease Maternal Grandmother    Cancer Maternal Grandfather    Cancer Paternal Grandmother    Heart disease Paternal Grandfather    Past Surgical History:  Procedure Laterality Date   CHOLECYSTECTOMY  10/2013   JOINT REPLACEMENT Bilateral 10/2008    knee each partial replacement   KNEE SURGERY     LAPAROSCOPIC ABDOMINAL EXPLORATION  06/1993   PARTIAL GASTRECTOMY  11/16/2012   VSG    WRIST SURGERY  09/1998   L   Social History   Social History Narrative   Right and left handed   Work Audiological scientist History  Administered Date(s) Administered   Influenza,inj,Quad PF,6+ Mos 02/16/2017   Pneumococcal Polysaccharide-23 03/17/2006, 11/15/2013   Td 12/23/2007   Tdap 01/16/2023   Zoster Recombinant(Shingrix) 12/08/2016, 02/16/2017     Objective: Vital Signs: BP (!) 151/89 (BP Location: Left Arm, Patient Position: Sitting, Cuff Size: Normal)   Pulse 73   Resp 17   Ht 5\' 8"  (1.727 m)   Wt 255 lb (115.7 kg)   BMI 38.77 kg/m    Physical Exam Vitals and nursing note reviewed.  Constitutional:      Appearance: She is well-developed.  HENT:     Head: Normocephalic and atraumatic.  Eyes:     Conjunctiva/sclera: Conjunctivae normal.  Cardiovascular:     Rate and Rhythm: Normal rate and regular rhythm.     Heart sounds: Normal heart sounds.  Pulmonary:     Effort: Pulmonary effort is normal.     Breath sounds: Normal breath sounds.  Abdominal:     General: Bowel sounds are normal.     Palpations: Abdomen is soft.  Musculoskeletal:     Cervical back: Normal range of motion.  Lymphadenopathy:     Cervical: No cervical adenopathy.  Skin:     General: Skin is warm and dry.     Capillary Refill: Capillary refill takes less than 2 seconds.  Neurological:     Mental Status: She is alert and oriented to person, place, and time.  Psychiatric:        Behavior: Behavior normal.      Musculoskeletal Exam: Cervical, thoracic and lumbar spine with good range of motion.  She has some discomfort range of motion of the shoulder joints.  Elbow joints and wrist joints with good range of motion.  She had bilateral CMC, PIP and DIP thickening with no synovitis.  Hip joints were in good range of motion.  Bilateral knee joints with partial replacement before.  She discomfort range of motion of the right knee joint and some warmth.  There was no tenderness over ankles or MTPs.  CDAI Exam: CDAI Score: --  Patient Global: --; Provider Global: -- Swollen: --; Tender: -- Joint Exam 09/22/2023   No joint exam has been documented for this visit   There is currently no information documented on the homunculus. Go to the Rheumatology activity and complete the homunculus joint exam.  Investigation: No additional findings.  Imaging: No results found.  Recent Labs: Lab Results  Component Value Date   WBC 6.4 08/31/2023   HGB 14.5 08/31/2023   PLT 249 08/31/2023   NA 141 08/31/2023   K 4.3 08/31/2023   CL 103 08/31/2023   CO2 23 08/31/2023   GLUCOSE 57 (L) 08/31/2023   BUN 18 08/31/2023   CREATININE 0.74 08/31/2023   BILITOT 1.8 (H) 02/27/2023   ALKPHOS 68 02/27/2023   AST 25 02/27/2023   ALT 32 02/27/2023   PROT 6.3 02/27/2023   ALBUMIN 4.4 02/27/2023   CALCIUM  9.4 08/31/2023   GFRAA 119 09/04/2017    Speciality Comments: No specialty comments available.  Procedures:  No procedures performed Allergies: Soy allergy (obsolete), Moxifloxacin, Gluten meal, Other, Penicillin g, Penicillins, and Quinolones   Assessment / Plan:     Visit Diagnoses: Primary osteoarthritis of both hands-she had bilateral CMC PIP and DIP thickening with  no synovitis.  Joint protection muscle strengthening was discussed.  Chronic right shoulder pain - s/p cortisone injection on 09/23/2022.  She has recurrence of shoulder joint discomfort.  A handout on shoulder joint exercises was given.  History of bilateral partial knee replacement-she has been experiencing increased discomfort and instability.  She is scheduled to have right total knee replacement in June.  Spondylosis of lumbar spine-she has intermittent discomfort in the lower back.  She had good mobility today.  Idiopathic chronic gout of multiple sites without tophus-previous diagnosis.  Uric acid was normal at 5.2 on August 16, 2019.  She is not on any medications.  She had no gout flares.  Osteopenia of multiple sites - DEXA ordered PCP. DEXA updated on 08/02/21: RFN BMD 0.829 with T-score -1.5.  She will get repeat DEXA scan this year.  Use of calcium  rich diet vitamin D  and exercise was emphasized.  The medical problems are listed as follows:  Essential hypertension, benign-blood pressure was elevated at 145/87.  Repeat blood pressure was 151/89.  She was advised to monitor blood pressure closely and follow-up with her PCP.  History of migraine  Dyslipidemia-weight loss diet and exercise was discussed.  History of restless legs syndrome  OSA (obstructive sleep apnea)  History of hypothyroidism  Orders: No orders of the defined types were placed in this encounter.  No orders of the defined types were placed in this encounter.   Follow-Up Instructions: Return in about 1 year (around 09/21/2024) for Osteoarthritis.   Nicholas Bari, MD  Note - This record has been created using Animal nutritionist.  Chart creation errors have been sought, but may not always  have been located. Such creation errors do not reflect on  the standard of medical care.

## 2023-09-22 ENCOUNTER — Encounter: Payer: Self-pay | Admitting: Rheumatology

## 2023-09-22 ENCOUNTER — Ambulatory Visit: Payer: BC Managed Care – PPO | Attending: Rheumatology | Admitting: Rheumatology

## 2023-09-22 VITALS — BP 151/89 | HR 73 | Resp 17 | Ht 68.0 in | Wt 255.0 lb

## 2023-09-22 DIAGNOSIS — M25511 Pain in right shoulder: Secondary | ICD-10-CM | POA: Diagnosis not present

## 2023-09-22 DIAGNOSIS — M19041 Primary osteoarthritis, right hand: Secondary | ICD-10-CM | POA: Diagnosis not present

## 2023-09-22 DIAGNOSIS — M19042 Primary osteoarthritis, left hand: Secondary | ICD-10-CM

## 2023-09-22 DIAGNOSIS — M1A09X0 Idiopathic chronic gout, multiple sites, without tophus (tophi): Secondary | ICD-10-CM

## 2023-09-22 DIAGNOSIS — Z96659 Presence of unspecified artificial knee joint: Secondary | ICD-10-CM

## 2023-09-22 DIAGNOSIS — M8589 Other specified disorders of bone density and structure, multiple sites: Secondary | ICD-10-CM

## 2023-09-22 DIAGNOSIS — G8929 Other chronic pain: Secondary | ICD-10-CM

## 2023-09-22 DIAGNOSIS — M47816 Spondylosis without myelopathy or radiculopathy, lumbar region: Secondary | ICD-10-CM | POA: Diagnosis not present

## 2023-09-22 DIAGNOSIS — I10 Essential (primary) hypertension: Secondary | ICD-10-CM

## 2023-09-22 DIAGNOSIS — E785 Hyperlipidemia, unspecified: Secondary | ICD-10-CM

## 2023-09-22 DIAGNOSIS — G4733 Obstructive sleep apnea (adult) (pediatric): Secondary | ICD-10-CM

## 2023-09-22 DIAGNOSIS — Z8639 Personal history of other endocrine, nutritional and metabolic disease: Secondary | ICD-10-CM

## 2023-09-22 DIAGNOSIS — Z8669 Personal history of other diseases of the nervous system and sense organs: Secondary | ICD-10-CM

## 2023-09-22 NOTE — Patient Instructions (Signed)
Hand Exercises Hand exercises can be helpful for almost anyone. They can strengthen your hands and improve flexibility and movement. The exercises can also increase blood flow to the hands. These results can make your work and daily tasks easier for you. Hand exercises can be especially helpful for people who have joint pain from arthritis or nerve damage from using their hands over and over. These exercises can also help people who injure a hand. Exercises Most of these hand exercises are gentle stretching and motion exercises. It is usually safe to do them often throughout the day. Warming up your hands before exercise may help reduce stiffness. You can do this with gentle massage or by placing your hands in warm water for 10-15 minutes. It is normal to feel some stretching, pulling, tightness, or mild discomfort when you begin new exercises. In time, this will improve. Remember to always be careful and stop right away if you feel sudden, very bad pain or your pain gets worse. You want to get better and be safe. Ask your health care provider which exercises are safe for you. Do exercises exactly as told by your provider and adjust them as told. Do not begin these exercises until told by your provider. Knuckle bend or "claw" fist  Stand or sit with your arm, hand, and all five fingers pointed straight up. Make sure to keep your wrist straight. Gently bend your fingers down toward your palm until the tips of your fingers are touching your palm. Keep your big knuckle straight and only bend the small knuckles in your fingers. Hold this position for 10 seconds. Straighten your fingers back to your starting position. Repeat this exercise 5-10 times with each hand. Full finger fist  Stand or sit with your arm, hand, and all five fingers pointed straight up. Make sure to keep your wrist straight. Gently bend your fingers into your palm until the tips of your fingers are touching the middle of your  palm. Hold this position for 10 seconds. Extend your fingers back to your starting position, stretching every joint fully. Repeat this exercise 5-10 times with each hand. Straight fist  Stand or sit with your arm, hand, and all five fingers pointed straight up. Make sure to keep your wrist straight. Gently bend your fingers at the big knuckle, where your fingers meet your hand, and at the middle knuckle. Keep the knuckle at the tips of your fingers straight and try to touch the bottom of your palm. Hold this position for 10 seconds. Extend your fingers back to your starting position, stretching every joint fully. Repeat this exercise 5-10 times with each hand. Tabletop  Stand or sit with your arm, hand, and all five fingers pointed straight up. Make sure to keep your wrist straight. Gently bend your fingers at the big knuckle, where your fingers meet your hand, as far down as you can. Keep the small knuckles in your fingers straight. Think of forming a tabletop with your fingers. Hold this position for 10 seconds. Extend your fingers back to your starting position, stretching every joint fully. Repeat this exercise 5-10 times with each hand. Finger spread  Place your hand flat on a table with your palm facing down. Make sure your wrist stays straight. Spread your fingers and thumb apart from each other as far as you can until you feel a gentle stretch. Hold this position for 10 seconds. Bring your fingers and thumb tight together again. Hold this position for 10 seconds. Repeat  this exercise 5-10 times with each hand. Making circles  Stand or sit with your arm, hand, and all five fingers pointed straight up. Make sure to keep your wrist straight. Make a circle by touching the tip of your thumb to the tip of your index finger. Hold for 10 seconds. Then open your hand wide. Repeat this motion with your thumb and each of your fingers. Repeat this exercise 5-10 times with each hand. Thumb  motion  Sit with your forearm resting on a table and your wrist straight. Your thumb should be facing up toward the ceiling. Keep your fingers relaxed as you move your thumb. Lift your thumb up as high as you can toward the ceiling. Hold for 10 seconds. Bend your thumb across your palm as far as you can, reaching the tip of your thumb for the small finger (pinkie) side of your palm. Hold for 10 seconds. Repeat this exercise 5-10 times with each hand. Grip strengthening  Hold a stress ball or other soft ball in the middle of your hand. Slowly increase the pressure, squeezing the ball as much as you can without causing pain. Think of bringing the tips of your fingers into the middle of your palm. All of your finger joints should bend when doing this exercise. Hold your squeeze for 10 seconds, then relax. Repeat this exercise 5-10 times with each hand. Contact a health care provider if: Your hand pain or discomfort gets much worse when you do an exercise. Your hand pain or discomfort does not improve within 2 hours after you exercise. If you have either of these problems, stop doing these exercises right away. Do not do them again unless your provider says that you can. Get help right away if: You develop sudden, severe hand pain or swelling. If this happens, stop doing these exercises right away. Do not do them again unless your provider says that you can. This information is not intended to replace advice given to you by your health care provider. Make sure you discuss any questions you have with your health care provider. Document Revised: 05/27/2022 Document Reviewed: 05/27/2022 Elsevier Patient Education  2024 [employer]  Shoulder Exercises Ask your health care provider which exercises are safe for you. Do exercises exactly as told by your health care provider and adjust them as directed. It is normal to feel mild stretching, pulling, tightness, or discomfort as you do these exercises.  Stop right away if you feel sudden pain or your pain gets worse. Do not begin these exercises until told by your health care provider. Stretching exercises External rotation and abduction This exercise is sometimes called corner stretch. The exercise rotates your arm outward (external rotation) and moves your arm out from your body (abduction). Stand in a doorway with one of your feet slightly in front of the other. This is called a staggered stance. If you cannot reach your forearms to the door frame, stand facing a corner of a room. Choose one of the following positions as told by your health care provider: Place your hands and forearms on the door frame above your head. Place your hands and forearms on the door frame at the height of your head. Place your hands on the door frame at the height of your elbows. Slowly move your weight onto your front foot until you feel a stretch across your chest and in the front of your shoulders. Keep your head and chest upright and keep your abdominal muscles tight. Hold for  __________ seconds. To release the stretch, shift your weight to your back foot. Repeat __________ times. Complete this exercise __________ times a day. Extension, standing  Stand and hold a broomstick, a cane, or a similar object behind your back. Your hands should be a little wider than shoulder-width apart. Your palms should face away from your back. Keeping your elbows straight and your shoulder muscles relaxed, move the stick away from your body until you feel a stretch in your shoulders (extension). Avoid shrugging your shoulders while you move the stick. Keep your shoulder blades tucked down toward the middle of your back. Hold for __________ seconds. Slowly return to the starting position. Repeat __________ times. Complete this exercise __________ times a day. Range-of-motion exercises Pendulum  Stand near a wall or a surface that you can hold onto for balance. Bend at the  waist and let your left / right arm hang straight down. Use your other arm to support you. Keep your back straight and do not lock your knees. Relax your left / right arm and shoulder muscles, and move your hips and your trunk so your left / right arm swings freely. Your arm should swing because of the motion of your body, not because you are using your arm or shoulder muscles. Keep moving your hips and trunk so your arm swings in the following directions, as told by your health care provider: Side to side. Forward and backward. In clockwise and counterclockwise circles. Continue each motion for __________ seconds, or for as long as told by your health care provider. Slowly return to the starting position. Repeat __________ times. Complete this exercise __________ times a day. Shoulder flexion, standing  Stand and hold a broomstick, a cane, or a similar object. Place your hands a little more than shoulder-width apart on the object. Your left / right hand should be palm-up, and your other hand should be palm-down. Keep your elbow straight and your shoulder muscles relaxed. Push the stick up with your healthy arm to raise your left / right arm in front of your body, and then over your head until you feel a stretch in your shoulder (flexion). Avoid shrugging your shoulder while you raise your arm. Keep your shoulder blade tucked down toward the middle of your back. Hold for __________ seconds. Slowly return to the starting position. Repeat __________ times. Complete this exercise __________ times a day. Shoulder abduction, standing  Stand and hold a broomstick, a cane, or a similar object. Place your hands a little more than shoulder-width apart on the object. Your left / right hand should be palm-up, and your other hand should be palm-down. Keep your elbow straight and your shoulder muscles relaxed. Push the object across your body toward your left / right side. Raise your left / right arm to the  side of your body (abduction) until you feel a stretch in your shoulder. Do not raise your arm above shoulder height unless your health care provider tells you to do that. If directed, raise your arm over your head. Avoid shrugging your shoulder while you raise your arm. Keep your shoulder blade tucked down toward the middle of your back. Hold for __________ seconds. Slowly return to the starting position. Repeat __________ times. Complete this exercise __________ times a day. Internal rotation  Place your left / right hand behind your back, palm-up. Use your other hand to dangle an exercise band, a broomstick, or a similar object over your shoulder. Grasp the band with your left /  right hand so you are holding on to both ends. Gently pull up on the band until you feel a stretch in the front of your left / right shoulder. The movement of your arm toward the center of your body is called internal rotation. Avoid shrugging your shoulder while you raise your arm. Keep your shoulder blade tucked down toward the middle of your back. Hold for __________ seconds. Release the stretch by letting go of the band and lowering your hands. Repeat __________ times. Complete this exercise __________ times a day. Strengthening exercises External rotation  Sit in a stable chair without armrests. Secure an exercise band to a stable object at elbow height on your left / right side. Place a soft object, such as a folded towel or a small pillow, between your left / right upper arm and your body to move your elbow about 4 inches (10 cm) away from your side. Hold the end of the exercise band so it is tight and there is no slack. Keeping your elbow pressed against the soft object, slowly move your forearm out, away from your abdomen (external rotation). Keep your body steady so only your forearm moves. Hold for __________ seconds. Slowly return to the starting position. Repeat __________ times. Complete this  exercise __________ times a day. Shoulder abduction  Sit in a stable chair without armrests, or stand up. Hold a __________ lb / kg weight in your left / right hand, or hold an exercise band with both hands. Start with your arms straight down and your left / right palm facing in, toward your body. Slowly lift your left / right hand out to your side (abduction). Do not lift your hand above shoulder height unless your health care provider tells you that this is safe. Keep your arms straight. Avoid shrugging your shoulder while you do this movement. Keep your shoulder blade tucked down toward the middle of your back. Hold for __________ seconds. Slowly lower your arm, and return to the starting position. Repeat __________ times. Complete this exercise __________ times a day. Shoulder extension  Sit in a stable chair without armrests, or stand up. Secure an exercise band to a stable object in front of you so it is at shoulder height. Hold one end of the exercise band in each hand. Straighten your elbows and lift your hands up to shoulder height. Squeeze your shoulder blades together as you pull your hands down to the sides of your thighs (extension). Stop when your hands are straight down by your sides. Do not let your hands go behind your body. Hold for __________ seconds. Slowly return to the starting position. Repeat __________ times. Complete this exercise __________ times a day. Shoulder row  Sit in a stable chair without armrests, or stand up. Secure an exercise band to a stable object in front of you so it is at chest height. Hold one end of the exercise band in each hand. Position your palms so that your thumbs are facing the ceiling (neutral position). Bend each of your elbows to a 90-degree angle (right angle) and keep your upper arms at your sides. Step back or move the chair back until the band is tight and there is no slack. Slowly pull your elbows back behind you. Hold for  __________ seconds. Slowly return to the starting position. Repeat __________ times. Complete this exercise __________ times a day. Shoulder press-ups  Sit in a stable chair that has armrests. Sit upright, with your feet flat on the  floor. Put your hands on the armrests so your elbows are bent and your fingers are pointing forward. Your hands should be about even with the sides of your body. Push down on the armrests and use your arms to lift yourself off the chair. Straighten your elbows and lift yourself up as much as you comfortably can. Move your shoulder blades down, and avoid letting your shoulders move up toward your ears. Keep your feet on the ground. As you get stronger, your feet should support less of your body weight as you lift yourself up. Hold for __________ seconds. Slowly lower yourself back into the chair. Repeat __________ times. Complete this exercise __________ times a day. Wall push-ups  Stand so you are facing a stable wall. Your feet should be about one arm-length away from the wall. Lean forward and place your palms on the wall at shoulder height. Keep your feet flat on the floor as you bend your elbows and lean forward toward the wall. Hold for __________ seconds. Straighten your elbows to push yourself back to the starting position. Repeat __________ times. Complete this exercise __________ times a day. This information is not intended to replace advice given to you by your health care provider. Make sure you discuss any questions you have with your health care provider. Document Revised: 07/02/2021 Document Reviewed: 07/02/2021 Elsevier Patient Education  2024 [employer].

## 2023-09-25 ENCOUNTER — Other Ambulatory Visit: Payer: Self-pay | Admitting: Physician Assistant

## 2023-10-01 ENCOUNTER — Ambulatory Visit: Payer: Self-pay

## 2023-10-01 ENCOUNTER — Ambulatory Visit: Admitting: Family Medicine

## 2023-10-01 ENCOUNTER — Encounter: Payer: Self-pay | Admitting: Family Medicine

## 2023-10-01 VITALS — BP 133/76 | HR 75 | Temp 97.7°F | Ht 68.0 in | Wt 251.6 lb

## 2023-10-01 DIAGNOSIS — J4521 Mild intermittent asthma with (acute) exacerbation: Secondary | ICD-10-CM

## 2023-10-01 DIAGNOSIS — J988 Other specified respiratory disorders: Secondary | ICD-10-CM

## 2023-10-01 MED ORDER — AZITHROMYCIN 250 MG PO TABS
ORAL_TABLET | ORAL | 0 refills | Status: DC
Start: 2023-10-01 — End: 2023-10-22

## 2023-10-01 MED ORDER — ALBUTEROL SULFATE HFA 108 (90 BASE) MCG/ACT IN AERS: 2.0000 | INHALATION_SPRAY | Freq: Four times a day (QID) | RESPIRATORY_TRACT | 11 refills | Status: AC | PRN

## 2023-10-01 NOTE — Progress Notes (Signed)
 Acute Office Visit  Subjective:     Patient ID: Candace Rodriguez, female    DOB: 1965/08/28, 58 y.o.   MRN: [id number]  Chief Complaint  Patient presents with   Cough    Cough This is a new problem. Episode onset: 2-3 weeks. Progression since onset: improved then worsened again a few days ago. her husband was sick last week as well. The cough is Productive of sputum (yellow/green). Associated symptoms include ear congestion, ear pain, a fever (100.9 this morning), headaches, a sore throat and wheezing (occasional). Pertinent negatives include no chest pain, chills, myalgias, nasal congestion or shortness of breath. She has tried prescription cough suppressant and a beta-agonist inhaler (mucinex , nyquil, cough drops.) for the symptoms. The treatment provided mild relief. Her past medical history is significant for asthma, bronchitis and pneumonia. There is no history of COPD.   Was prescribed prednisone burst on 09/19/23 but only took this for 2 days because the PT with ortho told her that she could have steroids close to her knee surgery. Had a negative home Covid test when symptoms started. Works with school system.   Review of Systems  Constitutional:  Positive for fever (100.9 this morning). Negative for chills.  HENT:  Positive for ear pain and sore throat.   Respiratory:  Positive for cough and wheezing (occasional). Negative for shortness of breath.   Cardiovascular:  Negative for chest pain.  Musculoskeletal:  Negative for myalgias.  Neurological:  Positive for headaches.        Objective:    BP 133/76   Pulse 75   Temp 97.7 F (36.5 C) (Temporal)   Ht 5\' 8"  (1.727 m)   Wt 251 lb 9.6 oz (114.1 kg)   SpO2 95%   BMI 38.26 kg/m    Physical Exam Vitals and nursing note reviewed.  Constitutional:      General: She is not in acute distress.    Appearance: She is not ill-appearing, toxic-appearing or diaphoretic.  HENT:     Right Ear: Tympanic membrane, ear canal  and external ear normal.     Left Ear: Tympanic membrane, ear canal and external ear normal.     Nose: Nose normal.     Mouth/Throat:     Pharynx: Posterior oropharyngeal erythema present. No pharyngeal swelling, oropharyngeal exudate, uvula swelling or postnasal drip.     Tonsils: No tonsillar exudate or tonsillar abscesses.  Eyes:     General:        Right eye: No discharge.        Left eye: No discharge.  Cardiovascular:     Rate and Rhythm: Normal rate and regular rhythm.     Heart sounds: Normal heart sounds. No murmur heard. Pulmonary:     Effort: Pulmonary effort is normal. No respiratory distress.     Breath sounds: Normal breath sounds. No wheezing, rhonchi or rales.  Chest:     Chest wall: No tenderness.  Abdominal:     General: Bowel sounds are normal. There is no distension.     Palpations: Abdomen is soft.     Tenderness: There is no abdominal tenderness. There is no guarding or rebound.  Musculoskeletal:     Cervical back: Neck supple. No rigidity.     Right lower leg: No edema.     Left lower leg: No edema.  Skin:    General: Skin is warm and dry.  Neurological:     General: No focal deficit present.  Mental Status: She is alert and oriented to person, place, and time.  Psychiatric:        Mood and Affect: Mood normal.        Behavior: Behavior normal.     No results found for any visits on 10/01/23.      Assessment & Plan:   Candace Rodriguez was seen today for cough.  Diagnoses and all orders for this visit:  Respiratory infection -     azithromycin  (ZITHROMAX  Z-PAK) 250 MG tablet; As directed  Mild intermittent asthma with acute exacerbation -     albuterol  (VENTOLIN  HFA) 108 (90 Base) MCG/ACT inhaler; Inhale 2 puffs into the lungs every 6 (six) hours as needed for wheezing or shortness of breath.  Lungs clear on exam today. Will treat empirically with zpak for secondary bacterial infection. Discussed increased symptoms could be new viral infection. She  will take a home Covid test again and will notify if positive. Discussed completing prednisone that was sent in by UC if ok with ortho. Albuterol  prn. Discussed symptomatic care and return precautions.   The patient indicates understanding of these issues and agrees with the plan.  Candace Huger, FNP

## 2023-10-01 NOTE — Telephone Encounter (Signed)
 Copied from CRM [id number]. Topic: Clinical - Red Word Triage >> Oct 01, 2023  8:12 AM Retta Caster wrote: Red Word that prompted transfer to Nurse Triage: sinus /Congestion mucus green brown/Couigh/Sore throat since easter but 1-2 days getting worse  Chief Complaint: congestion, fever, cough, sore throat Symptoms: see above Frequency: since Easter Pertinent Negatives: Patient denies cp, sob Disposition: [] ED /[] Urgent Care (no appt availability in office) / [x] Appointment(In office/virtual)/ []  [facility]/ [] Home Care/ [] Refused Recommended Disposition /[] [facility]/ []  Follow-up with PCP Additional Notes: apt made per protocol care advice given, denies questions; instructed to go to ER if becomes worse.   Reason for Disposition  Fever present > 3 days (72 hours)  Answer Assessment - Initial Assessment Questions 1. LOCATION: "Where does it hurt?"      Sore throat, headache 2. ONSET: "When did the sinus pain start?"  (e.g., hours, days)      Easter Monday 3. SEVERITY: "How bad is the pain?"   (Scale 1-10; mild, moderate or severe)   - MILD (1-3): doesn't interfere with normal activities    - MODERATE (4-7): interferes with normal activities (e.g., work or school) or awakens from sleep   - SEVERE (8-10): excruciating pain and patient unable to do any normal activities        moderate 4. RECURRENT SYMPTOM: "Have you ever had sinus problems before?" If Yes, ask: "When was the last time?" and "What happened that time?"      yes 5. NASAL CONGESTION: "Is the nose blocked?" If Yes, ask: "Can you open it or must you breathe through your mouth?"     denies 6. NASAL DISCHARGE: "Do you have discharge from your nose?" If so ask, "What color?"     denies 7. FEVER: "Do you have a fever?" If Yes, ask: "What is it, how was it measured, and when did it start?"      100.9 8. OTHER SYMPTOMS: "Do you have any other symptoms?" (e.g., sore throat, cough, earache, difficulty breathing)      Cough with greenish/brown, sore throat, headache 9. PREGNANCY: "Is there any chance you are pregnant?" "When was your last menstrual period?"     na  Protocols used: Sinus Pain or Congestion-A-AH

## 2023-10-06 ENCOUNTER — Encounter: Payer: Self-pay | Admitting: Family Medicine

## 2023-10-06 MED ORDER — DOXYCYCLINE HYCLATE 100 MG PO TABS: 100.0000 mg | ORAL_TABLET | Freq: Two times a day (BID) | ORAL | 0 refills | Status: DC

## 2023-10-15 ENCOUNTER — Other Ambulatory Visit: Payer: Self-pay | Admitting: Family Medicine

## 2023-10-22 ENCOUNTER — Encounter: Payer: Self-pay | Admitting: Family Medicine

## 2023-10-22 ENCOUNTER — Ambulatory Visit: Admitting: Family Medicine

## 2023-10-22 VITALS — BP 130/69 | HR 75 | Temp 97.9°F | Ht 68.0 in | Wt 258.8 lb

## 2023-10-22 DIAGNOSIS — J069 Acute upper respiratory infection, unspecified: Secondary | ICD-10-CM | POA: Diagnosis not present

## 2023-10-22 DIAGNOSIS — R0982 Postnasal drip: Secondary | ICD-10-CM | POA: Diagnosis not present

## 2023-10-22 DIAGNOSIS — R062 Wheezing: Secondary | ICD-10-CM | POA: Diagnosis not present

## 2023-10-22 DIAGNOSIS — J309 Allergic rhinitis, unspecified: Secondary | ICD-10-CM

## 2023-10-22 MED ORDER — FLUTICASONE PROPIONATE 50 MCG/ACT NA SUSP: 2.0000 | Freq: Every day | NASAL | 6 refills | Status: AC

## 2023-10-22 MED ORDER — METHYLPREDNISOLONE ACETATE 40 MG/ML IJ SUSP
40.0000 mg | Freq: Once | INTRAMUSCULAR | Status: AC
  Administered 2023-10-22: 60 mg via INTRAMUSCULAR

## 2023-10-22 MED ORDER — LEVOCETIRIZINE DIHYDROCHLORIDE 5 MG PO TABS: 5.0000 mg | ORAL_TABLET | Freq: Every evening | ORAL | 1 refills | Status: DC

## 2023-10-22 MED ORDER — CEFDINIR 300 MG PO CAPS
300.0000 mg | ORAL_CAPSULE | Freq: Two times a day (BID) | ORAL | 0 refills | Status: DC
Start: 2023-10-22 — End: 2024-01-19

## 2023-10-22 NOTE — Progress Notes (Signed)
 Subjective:  Patient ID: Candace Rodriguez, female    DOB: 01-01-1966, 58 y.o.   MRN: [id number]  Patient Care Team: Eliodoro Guerin, DO as PCP - General (Family Medicine) Jacqueline Matsu, MD as PCP - Cardiology (Cardiology) Devon Fogo, MD (Inactive) as Consulting Physician (Dermatology)   Chief Complaint:  Cough and Nasal Congestion (On and off since 4/21)   HPI: Candace Rodriguez is a 58 y.o. female presenting on 10/22/2023 for Cough and Nasal Congestion (On and off since 4/21)  Candace Rodriguez is a 58 year old female with asthma who presents with persistent cough and congestion.  Her symptoms began on April 21st and were initially managed through a [facility] service with prednisone and Tessalon  Perles, which were ineffective. She then received a Z-Pak from a clinic visit, also without relief. Communication through the patient portal led to a prescription of doxycycline , which provided some improvement. However, symptoms returned after completing the doxycycline  course, characterized by thick green mucus and persistent coughing.  She is concerned about her upcoming total knee replacement surgery scheduled in two weeks, fearing her respiratory symptoms might lead to its cancellation. Her primary symptoms include a persistent cough, thick mucus production, and occasional sinus-type headaches, particularly in the mornings, along with some sinus pressure.  She has a history of asthma, which is usually well-controlled and rarely requires attention unless she is ill. During her illness, she occasionally uses an inhaler. At home, she has been using Mucinex , cough drops, and Nyquil at night to manage her symptoms. Tessalon  Perles are ineffective for her, and she prefers Halls cough drops, although she has difficulty finding sugar-free options.  Her current medication regimen includes Panadol at night and Zyrtec in the morning for allergies. She has been on Zyrtec for a while without  recent changes to her antihistamine. She also has Flonase  but does not use it regularly. She uses patches for restless leg syndrome to prevent itching.        Relevant past medical, surgical, family, and social history reviewed and updated as indicated.  Allergies and medications reviewed and updated. Data reviewed: Chart in Epic.   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   left [id number]   Thyroid  disease    Tinnitus     Past Surgical History:  Procedure Laterality Date   CHOLECYSTECTOMY  10/2013   JOINT REPLACEMENT Bilateral 10/2008    knee each partial replacement   KNEE SURGERY     LAPAROSCOPIC ABDOMINAL EXPLORATION  06/1993   PARTIAL GASTRECTOMY  11/16/2012   VSG    WRIST SURGERY  09/1998   L    Social History   Socioeconomic History   Marital status: Married    Spouse name: Not on file   Number of children: Not on file  Years of education: Not on file   Highest education level: Master's degree (e.g., MA, MS, MEng, MEd, MSW, MBA)  Occupational History   Occupation: Speech and Solicitor  Tobacco Use   Smoking status: Former    Current packs/day: 0.00    Average packs/day: 2.0 packs/day for 10.0 years (20.0 ttl pk-yrs)    Types: Cigarettes    Start date: 05/26/1988    Quit date: 05/26/1998    Years since quitting: 25.4    Passive exposure: Never   Smokeless tobacco: Never  Vaping Use   Vaping status: Never  Used  Substance and Sexual Activity   Alcohol use: No    Alcohol/week: 0.0 standard drinks of alcohol   Drug use: No   Sexual activity: Not on file  Other Topics Concern   Not on file  Social History Narrative   Right and left handed   Work Doctor, general practice    Social Drivers of Health   Financial Resource Strain: Low Risk  (08/30/2023)   Overall Financial Resource Strain (CARDIA)    Difficulty of Paying Living Expenses: Not hard at all  Food Insecurity: No Food Insecurity (08/30/2023)   Hunger Vital Sign    Worried About Running Out of Food in the Last Year: Never true    Ran Out of Food in the Last Year: Never true  Transportation Needs: No Transportation Needs (08/30/2023)   PRAPARE - Administrator, Civil Service (Medical): No    Lack of Transportation (Non-Medical): No  Physical Activity: Inactive (08/30/2023)   Exercise Vital Sign    Days of Exercise per Week: 0 days    Minutes of Exercise per Session: 40 min  Stress: No Stress Concern Present (08/30/2023)   [employer] of Occupational Health - Occupational Stress Questionnaire    Feeling of Stress : Not at all  Social Connections: Socially Integrated (08/30/2023)   Social Connection and Isolation Panel [NHANES]    Frequency of Communication with Friends and Family: More than three times a week    Frequency of Social Gatherings with Friends and Family: More than three times a week    Attends Religious Services: More than 4 times per year    Active Member of [employer] or Organizations: Yes    Attends Engineer, structural: More than 4 times per year    Marital Status: Married  Catering manager Violence: Not At Risk (08/17/2023)   Received from [facility]   Humiliation, Afraid, Rape, and Kick questionnaire    Fear of Current or Ex-Partner: No    Emotionally Abused: No    Physically Abused: No    Sexually Abused: No    Outpatient Encounter Medications as of 10/22/2023  Medication Sig   albuterol   (VENTOLIN  HFA) 108 (90 Base) MCG/ACT inhaler Inhale 2 puffs into the lungs every 6 (six) hours as needed for wheezing or shortness of breath.   aspirin EC 81 MG tablet Take 1 tablet by mouth daily.   Calcium  200 MG TABS Take by mouth 2 (two) times daily.    carbidopa -levodopa  (SINEMET  IR) 25-100 MG tablet TAKE 1 TABLET BY MOUTH DAILY AS NEEDED   cefdinir  (OMNICEF ) 300 MG capsule Take 1 capsule (300 mg total) by mouth 2 (two) times daily. 1 po BID   Cholecalciferol (VITAMIN D3 PO) Take 20,000 mg by mouth daily.   clindamycin  (CLEOCIN  T) 1 % external solution Apply topically.   clobetasol  (TEMOVATE ) 0.05 % external solution Apply 1 Application topically daily as  needed (dermatitis of scalp).   dextromethorphan-guaiFENesin  (MUCINEX  DM) 30-600 MG 12hr tablet Take 1 tablet by mouth 2 (two) times daily.   diclofenac  (VOLTAREN ) 75 MG EC tablet Take 75 mg by mouth 2 (two) times daily.   diphenhydrAMINE HCl (BENADRYL ALLERGY PO) Take by mouth as needed.   fluticasone  (FLONASE ) 50 MCG/ACT nasal spray Place 2 sprays into both nostrils daily.   levocetirizine (XYZAL ) 5 MG tablet Take 1 tablet (5 mg total) by mouth every evening.   levothyroxine  (SYNTHROID ) 112 MCG tablet TAKE 1 TABLET BY MOUTH EVERY MORNING BEFORE BREAKFAST   levothyroxine  (SYNTHROID ) 125 MCG tablet TAKE 1 TABLET ( ) ON SATURDAYS & SUNDAYS. CONTINUE THE REAINDER OF THE WEEK   liothyronine (CYTOMEL) 25 MCG tablet Take by mouth daily. Half in morning half in afternoon   Magnesium Malate POWD 1,500 mg by Does not apply route.   Melatonin 10 MG TABS Take by mouth daily.   metoprolol  succinate (TOPROL -XL) 50 MG 24 hr tablet TAKE ONE (1) TABLET BY MOUTH EVERY DAY   Multiple Vitamin (MULTIVITAMIN) tablet Take 1 tablet by mouth 3 (three) times daily. Celebrate Bariatric   ondansetron (ZOFRAN) 4 MG tablet Take 4 mg by mouth.   Probiotic Product (PROBIOTIC PO) Take by mouth daily.   rosuvastatin  (CRESTOR ) 20 MG tablet TAKE ONE (1) TABLET  BY MOUTH EVERY DAY   Rotigotine  (NEUPRO ) 1 MG/24HR PT24 Place 1 patch on arm daily.   testosterone enanthate (DELATESTRYL) 200 MG/ML injection Inject into the muscle every 7 (seven) days. For IM use only   Tirzepatide-Weight Management (ZEPBOUND [place])    vortioxetine  HBr (TRINTELLIX ) 10 MG TABS tablet TAKE ONE TABLET BY MOUTH EVERY MORNING   [DISCONTINUED] cetirizine (ZYRTEC) 10 MG tablet Take 1 tablet by mouth daily.   [DISCONTINUED] azithromycin  (ZITHROMAX  Z-PAK) 250 MG tablet As directed   [DISCONTINUED] benzonatate  (TESSALON ) 200 MG capsule Take 200 mg by mouth 3 (three) times daily as needed.   [DISCONTINUED] doxycycline  (VIBRA -TABS) 100 MG tablet Take 1 tablet (100 mg total) by mouth 2 (two) times daily.   [DISCONTINUED] predniSONE (DELTASONE) 20 MG tablet Take 20 mg by mouth 2 (two) times daily.   No facility-administered encounter medications on file as of 10/22/2023.    Allergies  Allergen Reactions   Soy Allergy (Obsolete) Rash    Rash in mouth.  Other Reaction(s): other   Moxifloxacin     Other Reaction(s): irregular heart rate, irregular heart rate   Gluten Meal Other (See Comments)    Joint Pain   Other Other (See Comments)    Dairy intolerance   Penicillin G Itching and Other (See Comments)    Turns red in color.   Penicillins    Quinolones Other (See Comments)    Pt can't remeber    Pertinent ROS per HPI, otherwise unremarkable      Objective:  BP 130/69   Pulse 75   Temp 97.9 F (36.6 C)   Ht 5\' 8"  (1.727 m)   Wt 258 lb 12.8 oz (117.4 kg)   SpO2 94%   BMI 39.35 kg/m    Wt Readings from Last 3 Encounters:  10/22/23 258 lb 12.8 oz (117.4 kg)  10/01/23 251 lb 9.6 oz (114.1 kg)  09/22/23 255 lb (115.7 kg)    Physical Exam Vitals and nursing note reviewed.  Constitutional:      General: She is not in acute distress.    Appearance: Normal appearance. She is obese. She is ill-appearing. She is not toxic-appearing or  diaphoretic.  HENT:     Head:  Normocephalic and atraumatic.     Right Ear: A middle ear effusion is present.     Left Ear: A middle ear effusion is present.     Nose: Nose normal.     Mouth/Throat:     Lips: Pink.     Mouth: Mucous membranes are moist.     Pharynx: Posterior oropharyngeal erythema and postnasal drip present. No oropharyngeal exudate.     Comments: Cobblestoning to posterior oropharynx Eyes:     Conjunctiva/sclera: Conjunctivae normal.     Pupils: Pupils are equal, round, and reactive to light.  Cardiovascular:     Rate and Rhythm: Normal rate and regular rhythm.     Heart sounds: Normal heart sounds.  Pulmonary:     Effort: Pulmonary effort is normal.     Breath sounds: Wheezing present. No rhonchi.     Comments: Nonproductive cough Musculoskeletal:     Cervical back: Neck supple.  Skin:    General: Skin is warm and dry.     Capillary Refill: Capillary refill takes less than 2 seconds.  Neurological:     General: No focal deficit present.     Mental Status: She is alert and oriented to person, place, and time.  Psychiatric:        Mood and Affect: Mood normal.        Behavior: Behavior normal.        Thought Content: Thought content normal.        Judgment: Judgment normal.      Results for orders placed or performed in visit on 08/31/23  CBC   Collection Time: 08/31/23  3:28 PM  Result Value Ref Range   WBC 6.4 3.4 - 10.8 x10E3/uL   RBC 5.47 (H) 3.77 - 5.28 x10E6/uL   Hemoglobin 14.5 11.1 - 15.9 g/dL   Hematocrit 04.5 40.9 - 46.6 %   MCV 81 79 - 97 fL   MCH 26.5 (L) 26.6 - 33.0 pg   MCHC 32.8 31.5 - 35.7 g/dL   RDW 81.1 91.4 - 78.2 %   Platelets 249 150 - 450 x10E3/uL  Basic Metabolic Panel   Collection Time: 08/31/23  3:28 PM  Result Value Ref Range   Glucose 57 (L) 70 - 99 mg/dL   BUN 18 6 - 24 mg/dL   Creatinine, Ser 9.56 0.57 - 1.00 mg/dL   eGFR 94 >21 HY/QMV/7.84   BUN/Creatinine Ratio 24 (H) 9 - 23   Sodium 141 134 - 144 mmol/L   Potassium 4.3 3.5 - 5.2 mmol/L    Chloride 103 96 - 106 mmol/L   CO2 23 20 - 29 mmol/L   Calcium  9.4 8.7 - 10.2 mg/dL       Pertinent labs & imaging results that were available during my care of the patient were reviewed by me and considered in my medical decision making.  Assessment & Plan:  Ellajane was seen today for cough and nasal congestion.  Diagnoses and all orders for this visit:  URI with cough and congestion -     cefdinir  (OMNICEF ) 300 MG capsule; Take 1 capsule (300 mg total) by mouth 2 (two) times daily. 1 po BID -     fluticasone  (FLONASE ) 50 MCG/ACT nasal spray; Place 2 sprays into both nostrils daily. -     levocetirizine (XYZAL ) 5 MG tablet; Take 1 tablet (5 mg total) by mouth every evening.  Wheezing -     cefdinir  (OMNICEF ) 300  MG capsule; Take 1 capsule (300 mg total) by mouth 2 (two) times daily. 1 po BID -     fluticasone  (FLONASE ) 50 MCG/ACT nasal spray; Place 2 sprays into both nostrils daily. -     levocetirizine (XYZAL ) 5 MG tablet; Take 1 tablet (5 mg total) by mouth every evening.  Allergic rhinitis with postnasal drip -     fluticasone  (FLONASE ) 50 MCG/ACT nasal spray; Place 2 sprays into both nostrils daily. -     levocetirizine (XYZAL ) 5 MG tablet; Take 1 tablet (5 mg total) by mouth every evening.      Cough Persistent cough with green mucus since April 21, unresponsive to previous treatments including prednisone, Tessalon  Perles, Z-Pak, and doxycycline . Symptoms include thick mucus, cough, and sinus headache. Concern about upcoming knee replacement surgery in two weeks and desire to resolve symptoms before then. Allergy to penicillins noted, influencing antibiotic choice. Cefdinir  chosen as an alternative due to penicillin allergy. - Administer Depo-Medrol  injection in office. - Prescribe cefdinir  (Omnicef ) 300 mg twice daily for 10 days. - Instruct to use Flonase  nasal spray daily for the next two weeks. - Advise to switch from Zyrtec to Xyzal  for antihistamine therapy. - Continue  Mucinex  with plenty of water.  Sinus headache Sinus headache likely related to upper respiratory symptoms and congestion. Symptoms include morning headaches and sinus pressure.  Asthma Asthma is generally well-controlled and not a primary concern unless during illness. No current exacerbation noted.  Allergy to penicillins Allergy to penicillins noted, influencing antibiotic choice. Cefdinir  chosen as an alternative due to penicillin allergy.           Continue all other maintenance medications.  Follow up plan: Return if symptoms worsen or fail to improve.   Continue healthy lifestyle choices, including diet (rich in fruits, vegetables, and lean proteins, and low in salt and simple carbohydrates) and exercise (at least 30 minutes of moderate physical activity daily).  Educational handout given for URI  The above assessment and management plan was discussed with the patient. The patient verbalized understanding of and has agreed to the management plan. Patient is aware to call the clinic if they develop any new symptoms or if symptoms persist or worsen. Patient is aware when to return to the clinic for a follow-up visit. Patient educated on when it is appropriate to go to the emergency department.   Kattie Parrot, FNP-C [facility] [phone]

## 2023-10-22 NOTE — Addendum Note (Signed)
 Addended by: Rudolfo Cosier C on: 10/22/2023 11:37 AM   Modules accepted: Orders

## 2023-10-28 ENCOUNTER — Encounter: Payer: Self-pay | Admitting: Family Medicine

## 2023-10-29 ENCOUNTER — Other Ambulatory Visit: Payer: Self-pay | Admitting: Family Medicine

## 2023-10-29 ENCOUNTER — Ambulatory Visit (INDEPENDENT_AMBULATORY_CARE_PROVIDER_SITE_OTHER)

## 2023-10-29 ENCOUNTER — Ambulatory Visit: Payer: Self-pay | Admitting: Family Medicine

## 2023-10-29 ENCOUNTER — Encounter: Payer: Self-pay | Admitting: Family Medicine

## 2023-10-29 ENCOUNTER — Ambulatory Visit: Admitting: Family Medicine

## 2023-10-29 ENCOUNTER — Telehealth: Payer: Self-pay | Admitting: Family Medicine

## 2023-10-29 VITALS — BP 122/72 | HR 64 | Temp 97.5°F | Ht 68.0 in | Wt 262.2 lb

## 2023-10-29 DIAGNOSIS — R0982 Postnasal drip: Secondary | ICD-10-CM | POA: Diagnosis not present

## 2023-10-29 DIAGNOSIS — R051 Acute cough: Secondary | ICD-10-CM

## 2023-10-29 DIAGNOSIS — R062 Wheezing: Secondary | ICD-10-CM

## 2023-10-29 DIAGNOSIS — J309 Allergic rhinitis, unspecified: Secondary | ICD-10-CM

## 2023-10-29 DIAGNOSIS — J4540 Moderate persistent asthma, uncomplicated: Secondary | ICD-10-CM | POA: Diagnosis not present

## 2023-10-29 DIAGNOSIS — E039 Hypothyroidism, unspecified: Secondary | ICD-10-CM

## 2023-10-29 MED ORDER — BUDESONIDE-FORMOTEROL FUMARATE 80-4.5 MCG/ACT IN AERO: 2.0000 | INHALATION_SPRAY | Freq: Two times a day (BID) | RESPIRATORY_TRACT | 3 refills | Status: DC

## 2023-10-29 MED ORDER — PREDNISONE 20 MG PO TABS: 40.0000 mg | ORAL_TABLET | Freq: Every day | ORAL | 0 refills | Status: AC

## 2023-10-29 MED ORDER — MONTELUKAST SODIUM 10 MG PO TABS: 10.0000 mg | ORAL_TABLET | Freq: Every day | ORAL | 3 refills | Status: DC

## 2023-10-29 NOTE — Telephone Encounter (Signed)
 Received call from Polly Brink at [facility] who says they received patients Symbicort Rx but says it was sent in with quantity of 1 and needs to be sent in for 10.2 grams.

## 2023-10-29 NOTE — Telephone Encounter (Signed)
 Corrected RX and sent back into the pharmacy.

## 2023-10-29 NOTE — Progress Notes (Signed)
 Subjective:  Patient ID: Candace Rodriguez, female    DOB: [birth year], 58 y.o.   MRN: [id number]  Patient Care Team: Eliodoro Guerin, DO as PCP - General (Family Medicine) Jacqueline Matsu, MD as PCP - Cardiology (Cardiology) Devon Fogo, MD (Inactive) as Consulting Physician (Dermatology)   Chief Complaint:  Nasal Congestion and Cough (Ongoing )   HPI: Candace Rodriguez is a 58 y.o. female presenting on 10/29/2023 for Nasal Congestion and Cough (Ongoing )   Candace Rodriguez is a 58 year old female with asthma who presents with persistent cough and congestion.  She has been experiencing a persistent cough and congestion that worsened after spending time in a middle school, suspecting mold exposure. The symptoms were notably worse on Tuesday morning. She is currently taking Mucinex  every four hours, which has provided some relief, and is on cefdinir , which she will complete by Saturday.  She uses albuterol  every six hours as a precaution. She switched to Xyzal  and is using Flonase  once daily in the mornings. She feels unwell today, with a sensation of not being able to take a deep breath, although she is not experiencing difficulty breathing.  She received a steroid shot previously, which seemed to improve her symptoms. She has a history of asthma, diagnosed years ago, but has not seen a pulmonologist. She wakes up very congested in the mornings and has a thick white sputum, which is not yellow.  She has been on various antibiotics including doxycycline  and azithromycin  in the past. She is also using Nucleotides DM and continues with Mucinex  and Flonase .          Relevant past medical, surgical, family, and social history reviewed and updated as indicated.  Allergies and medications reviewed and updated. Data reviewed: Chart in Epic.   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   left [id number]   Thyroid  disease    Tinnitus     Past Surgical History:  Procedure Laterality Date   CHOLECYSTECTOMY  10/2013   JOINT REPLACEMENT Bilateral 10/2008    knee each partial replacement   KNEE SURGERY     LAPAROSCOPIC ABDOMINAL EXPLORATION  06/1993   PARTIAL GASTRECTOMY  11/16/2012   VSG    WRIST SURGERY  09/1998   L    Social History   Socioeconomic History   Marital status: Married    Spouse name: Not on file   Number of children: Not on file   Years of education: Not on file   Highest education level: Master's degree (e.g., MA, MS, MEng, MEd, MSW, MBA)  Occupational History   Occupation: Speech and Solicitor  Tobacco Use   Smoking status: Former    Current packs/day: 0.00    Average packs/day: 2.0 packs/day for 10.0 years (20.0 ttl pk-yrs)  Types: Cigarettes    Start date: 05/26/1988    Quit date: 05/26/1998    Years since quitting: 25.4    Passive exposure: Never   Smokeless tobacco: Never  Vaping Use   Vaping status: Never Used  Substance and Sexual Activity   Alcohol use: No    Alcohol/week: 0.0 standard drinks of alcohol   Drug use: No   Sexual activity: Not on file  Other Topics Concern   Not on file  Social History Narrative   Right and left handed   Work Doctor, general practice    Social Drivers of Health   Financial Resource Strain: Low Risk  (08/30/2023)   Overall Financial Resource Strain (CARDIA)    Difficulty of Paying Living Expenses: Not hard at all  Food Insecurity: No  Food Insecurity (08/30/2023)   Hunger Vital Sign    Worried About Running Out of Food in the Last Year: Never true    Ran Out of Food in the Last Year: Never true  Transportation Needs: No Transportation Needs (08/30/2023)   PRAPARE - Administrator, Civil Service (Medical): No    Lack of Transportation (Non-Medical): No  Physical Activity: Inactive (08/30/2023)   Exercise Vital Sign    Days of Exercise per Week: 0 days    Minutes of Exercise per Session: 40 min  Stress: No Stress Concern Present (08/30/2023)   [employer] of Occupational Health - Occupational Stress Questionnaire    Feeling of Stress : Not at all  Social Connections: Socially Integrated (08/30/2023)   Social Connection and Isolation Panel [NHANES]    Frequency of Communication with Friends and Family: More than three times a week    Frequency of Social Gatherings with Friends and Family: More than three times a week    Attends Religious Services: More than 4 times per year    Active Member of [employer] or Organizations: Yes    Attends Engineer, structural: More than 4 times per year    Marital Status: Married  Catering manager Violence: Not At Risk (08/17/2023)   Received from [facility]   Humiliation, Afraid, Rape, and Kick questionnaire    Fear of Current or Ex-Partner: No    Emotionally Abused: No    Physically Abused: No    Sexually Abused: No    Outpatient Encounter Medications as of 10/29/2023  Medication Sig   albuterol  (VENTOLIN  HFA) 108 (90 Base) MCG/ACT inhaler Inhale 2 puffs into the lungs every 6 (six) hours as needed for wheezing or shortness of breath.   aspirin EC 81 MG tablet Take 1 tablet by mouth daily.   Calcium  200 MG TABS Take by mouth 2 (two) times daily.    carbidopa -levodopa  (SINEMET  IR) 25-100 MG tablet TAKE 1 TABLET BY MOUTH DAILY AS NEEDED   Cholecalciferol (VITAMIN D3 PO) Take 20,000 mg by mouth daily.   clindamycin  (CLEOCIN  T) 1 % external solution Apply topically.    clobetasol  (TEMOVATE ) 0.05 % external solution Apply 1 Application topically daily as needed (dermatitis of scalp).   dextromethorphan-guaiFENesin  (MUCINEX  DM) 30-600 MG 12hr tablet Take 1 tablet by mouth 2 (two) times daily.   diclofenac  (VOLTAREN ) 75 MG EC tablet Take 75 mg by mouth 2 (two) times daily.   diphenhydrAMINE HCl (BENADRYL ALLERGY PO) Take by mouth as needed.   fluticasone  (FLONASE ) 50 MCG/ACT nasal spray Place 2 sprays into both nostrils daily.   levocetirizine (XYZAL ) 5 MG tablet Take 1 tablet (5 mg total) by mouth every  evening.   levothyroxine  (SYNTHROID ) 112 MCG tablet TAKE 1 TABLET BY MOUTH EVERY MORNING BEFORE BREAKFAST   levothyroxine  (SYNTHROID ) 125 MCG tablet TAKE 1 TABLET ( ) ON SATURDAYS & SUNDAYS. CONTINUE THE REAINDER OF THE WEEK   liothyronine (CYTOMEL) 25 MCG tablet Take by mouth daily. Half in morning half in afternoon   Magnesium Malate POWD 1,500 mg by Does not apply route.   Melatonin 10 MG TABS Take by mouth daily.   metoprolol  succinate (TOPROL -XL) 50 MG 24 hr tablet TAKE ONE (1) TABLET BY MOUTH EVERY DAY   montelukast (SINGULAIR) 10 MG tablet Take 1 tablet (10 mg total) by mouth at bedtime.   Multiple Vitamin (MULTIVITAMIN) tablet Take 1 tablet by mouth 3 (three) times daily. Celebrate Bariatric   ondansetron (ZOFRAN) 4 MG tablet Take 4 mg by mouth.   predniSONE (DELTASONE) 20 MG tablet Take 2 tablets (40 mg total) by mouth daily with breakfast for 5 days.   Probiotic Product (PROBIOTIC PO) Take by mouth daily.   rosuvastatin  (CRESTOR ) 20 MG tablet TAKE ONE (1) TABLET BY MOUTH EVERY DAY   Rotigotine  (NEUPRO ) 1 MG/24HR PT24 Place 1 patch on arm daily.   testosterone enanthate (DELATESTRYL) 200 MG/ML injection Inject into the muscle every 7 (seven) days. For IM use only   Tirzepatide-Weight Management (ZEPBOUND [place])    vortioxetine  HBr (TRINTELLIX ) 10 MG TABS tablet TAKE ONE TABLET BY MOUTH EVERY MORNING   [DISCONTINUED] budesonide-formoterol  (SYMBICORT) 80-4.5 MCG/ACT inhaler Inhale 2 puffs into the lungs 2 (two) times daily.   cefdinir  (OMNICEF ) 300 MG capsule Take 1 capsule (300 mg total) by mouth 2 (two) times daily. 1 po BID (Patient not taking: Reported on 10/29/2023)   No facility-administered encounter medications on file as of 10/29/2023.    Allergies  Allergen Reactions   Soy Allergy (Obsolete) Rash    Rash in mouth.  Other Reaction(s): other   Moxifloxacin     Other Reaction(s): irregular heart rate, irregular heart rate   Gluten Meal Other (See Comments)    Joint Pain   Other Other (See Comments)    Dairy intolerance   Penicillin G Itching and Other (See Comments)    Turns red in color.   Penicillins    Quinolones Other (See Comments)    Pt can't remeber    Pertinent ROS per HPI, otherwise unremarkable      Objective:  BP 122/72   Pulse 64   Temp (!) 97.5 F (36.4 C)   Ht 5\' 8"  (1.727 m)   Wt 262 lb 3.2 oz (118.9 kg)   SpO2 95%   BMI 39.87 kg/m    Wt Readings from Last 3 Encounters:  10/29/23 262 lb 3.2 oz (118.9 kg)  10/22/23 258 lb 12.8 oz (117.4 kg)  10/01/23 251 lb 9.6 oz (114.1 kg)    Physical Exam Vitals and nursing note reviewed.  Constitutional:      General: She is not in acute distress.    Appearance: Normal appearance. She is obese. She is not ill-appearing, toxic-appearing or diaphoretic.  HENT:     Head: Normocephalic and atraumatic.     Nose: Nose normal.     Mouth/Throat:     Mouth: Mucous membranes are moist.  Eyes:     Pupils: Pupils are equal, round, and reactive to light.  Cardiovascular:     Rate and Rhythm: Normal rate and regular rhythm.     Heart sounds: Normal heart sounds.  Pulmonary:     Effort: Pulmonary  effort is normal.     Breath sounds: Normal breath sounds. No wheezing, rhonchi or rales.  Musculoskeletal:     Cervical back: Neck supple.     Right lower leg: No edema.     Left lower leg: No edema.  Skin:    General: Skin is warm and dry.      Capillary Refill: Capillary refill takes less than 2 seconds.  Neurological:     General: No focal deficit present.     Mental Status: She is alert and oriented to person, place, and time.  Psychiatric:        Mood and Affect: Mood normal.        Behavior: Behavior normal.        Thought Content: Thought content normal.        Judgment: Judgment normal.    X-Ray: CXR: No acute findings. Preliminary x-ray reading by Kattie Parrot, FNP-C, [facility].   Results for orders placed or performed in visit on 08/31/23  CBC   Collection Time: 08/31/23  3:28 PM  Result Value Ref Range   WBC 6.4 3.4 - 10.8 x10E3/uL   RBC 5.47 (H) 3.77 - 5.28 x10E6/uL   Hemoglobin 14.5 11.1 - 15.9 g/dL   Hematocrit 19.1 47.8 - 46.6 %   MCV 81 79 - 97 fL   MCH 26.5 (L) 26.6 - 33.0 pg   MCHC 32.8 31.5 - 35.7 g/dL   RDW 29.5 62.1 - 30.8 %   Platelets 249 150 - 450 x10E3/uL  Basic Metabolic Panel   Collection Time: 08/31/23  3:28 PM  Result Value Ref Range   Glucose 57 (L) 70 - 99 mg/dL   BUN 18 6 - 24 mg/dL   Creatinine, Ser 6.57 0.57 - 1.00 mg/dL   eGFR 94 >84 ON/GEX/5.28   BUN/Creatinine Ratio 24 (H) 9 - 23   Sodium 141 134 - 144 mmol/L   Potassium 4.3 3.5 - 5.2 mmol/L   Chloride 103 96 - 106 mmol/L   CO2 23 20 - 29 mmol/L   Calcium  9.4 8.7 - 10.2 mg/dL       Pertinent labs & imaging results that were available during my care of the patient were reviewed by me and considered in my medical decision making.  Assessment & Plan:  Candace Rodriguez was seen today for nasal congestion and cough.  Diagnoses and all orders for this visit:  Moderate persistent asthma without complication -     Discontinue: budesonide-formoterol (SYMBICORT) 80-4.5 MCG/ACT inhaler; Inhale 2 puffs into the lungs 2 (two) times daily. -     montelukast (SINGULAIR) 10 MG tablet; Take 1 tablet (10 mg total) by mouth at bedtime. -     predniSONE (DELTASONE) 20 MG tablet; Take 2 tablets (40 mg total) by mouth daily with breakfast for 5 days. -      DG Chest 2 View; Future  Wheezing -     Discontinue: budesonide-formoterol (SYMBICORT) 80-4.5 MCG/ACT inhaler; Inhale 2 puffs into the lungs 2 (two) times daily. -     montelukast (SINGULAIR) 10 MG tablet; Take 1 tablet (10 mg total) by mouth at bedtime. -     predniSONE (DELTASONE) 20 MG tablet; Take 2 tablets (40 mg total) by mouth daily with breakfast for 5 days. -     DG Chest 2 View; Future  Allergic rhinitis with postnasal drip -     montelukast (SINGULAIR) 10 MG tablet; Take 1 tablet (10 mg total) by mouth at bedtime.  Acute cough -  Discontinue: budesonide-formoterol (SYMBICORT) 80-4.5 MCG/ACT inhaler; Inhale 2 puffs into the lungs 2 (two) times daily. -     montelukast (SINGULAIR) 10 MG tablet; Take 1 tablet (10 mg total) by mouth at bedtime. -     predniSONE (DELTASONE) 20 MG tablet; Take 2 tablets (40 mg total) by mouth daily with breakfast for 5 days. -     DG Chest 2 View; Future      Asthma exacerbation Experiencing an exacerbation of asthma with persistent cough, congestion, and difficulty taking deep breaths, worsened after exposure to a potentially moldy environment. Currently on cefdinir , albuterol , and Flonase , with a switch to Xyzal . Symptoms are likely asthma-related rather than infectious, given the lack of improvement with antibiotics and response to a previous steroid shot. - Order chest x-ray to rule out any concerning findings - Prescribe Symbicort inhaler (budesonide/formoterol) twice daily with mouth rinsing after use - Prescribe Singulair (montelukast) to be taken at night - Prescribe prednisone for a 5-day course - Continue albuterol  as needed for severe cough, wheezing, or shortness of breath - Continue Mucinex  and Flonase  - Advise to complete the full course of cefdinir  - Consider referral to pulmonology if symptoms do not improve by Monday or Tuesday  Surgical follow-up Scheduled for surgery next Thursday. Advised to monitor symptoms closely and  contact provider if not improved by Monday or Tuesday to discuss potential cancellation of surgery. - Advise to call or send a message by Tuesday if symptoms do not improve - Schedule follow-up appointment or communication for Tuesday        Continue all other maintenance medications.  Follow up plan: Return if symptoms worsen or fail to improve.   Continue healthy lifestyle choices, including diet (rich in fruits, vegetables, and lean proteins, and low in salt and simple carbohydrates) and exercise (at least 30 minutes of moderate physical activity daily).  Educational handout given for cough   The above assessment and management plan was discussed with the patient. The patient verbalized understanding of and has agreed to the management plan. Patient is aware to call the clinic if they develop any new symptoms or if symptoms persist or worsen. Patient is aware when to return to the clinic for a follow-up visit. Patient educated on when it is appropriate to go to the emergency department.   Kattie Parrot, FNP-C [facility] [phone]

## 2023-11-03 ENCOUNTER — Encounter: Payer: Self-pay | Admitting: Family Medicine

## 2023-11-05 HISTORY — PX: TOTAL KNEE ARTHROPLASTY: SHX125

## 2023-11-17 IMAGING — US US EXTREM LOW VENOUS*L*
1 series · 13 of 24 positions shown · non-contrast
Comparison: None.

CLINICAL DATA: Pain and swelling in the left lower leg.



[Series 1: us venous img lower uni left (dvt) · portal-venous · 13 of 39 slices shown]
[im 1/39]
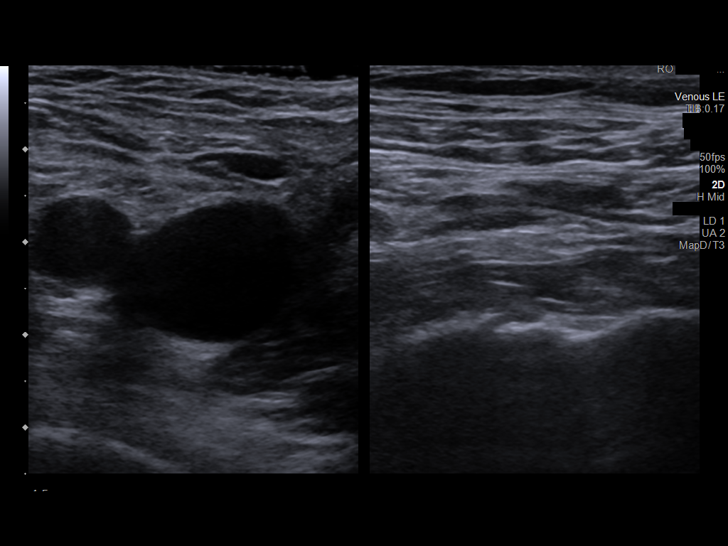
[im 4/39]
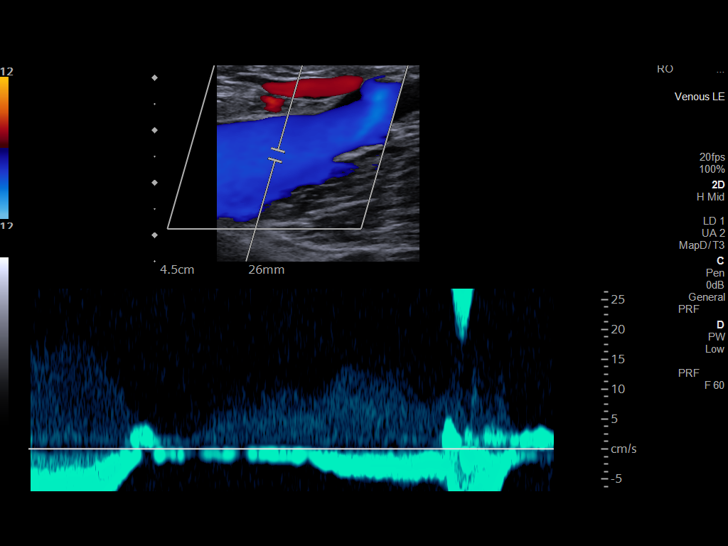
[im 7/39]
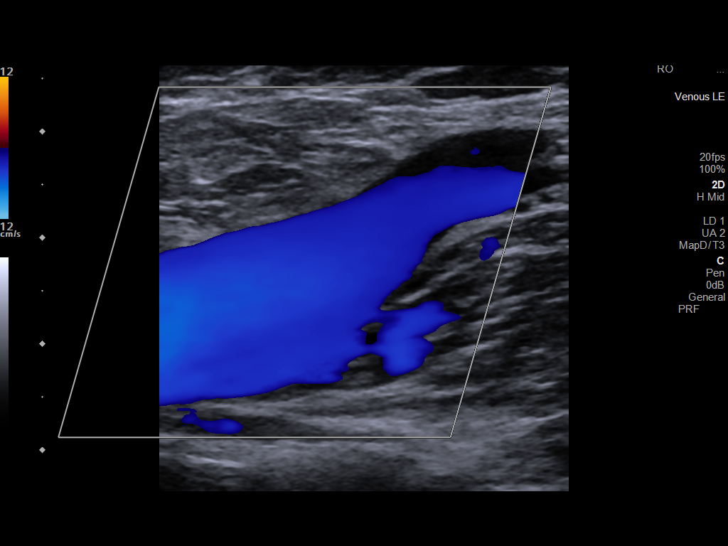
[im 10/39]
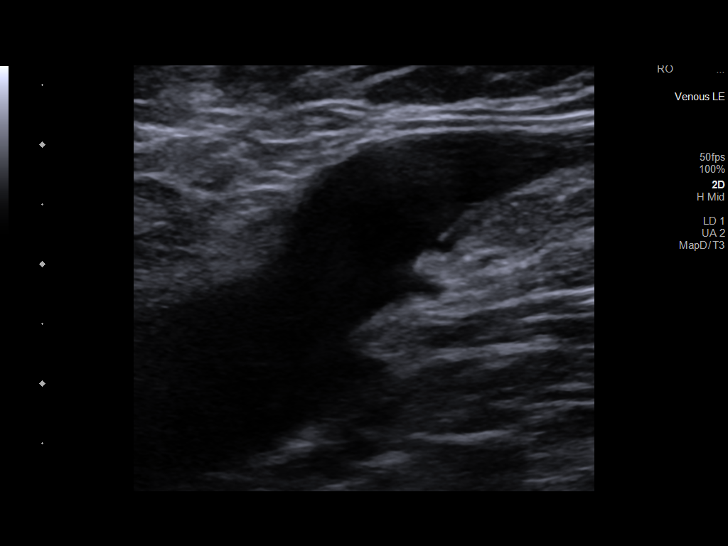
[im 14/39]
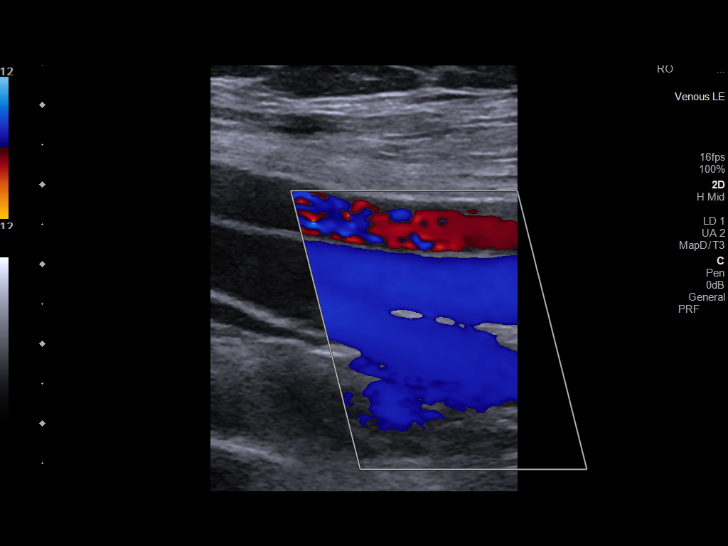
[im 17/39]
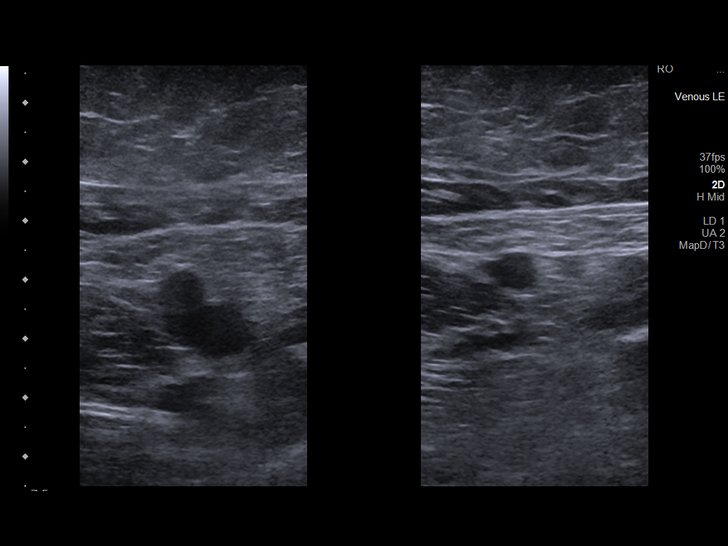
[im 20/39]
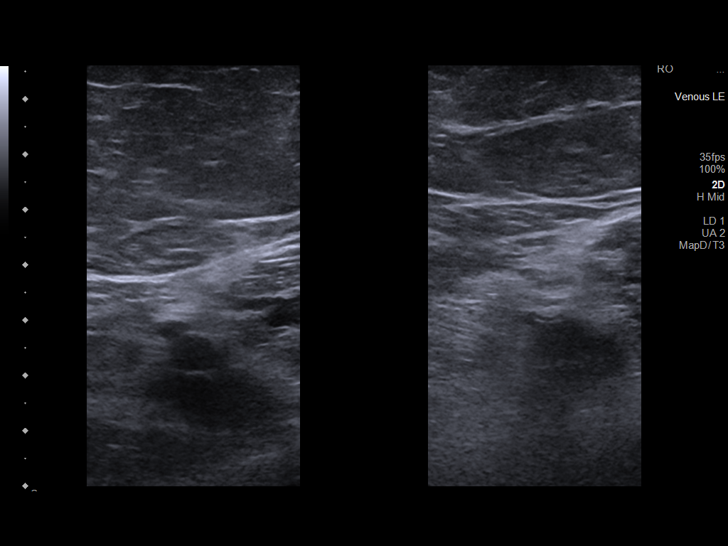
[im 22/39]
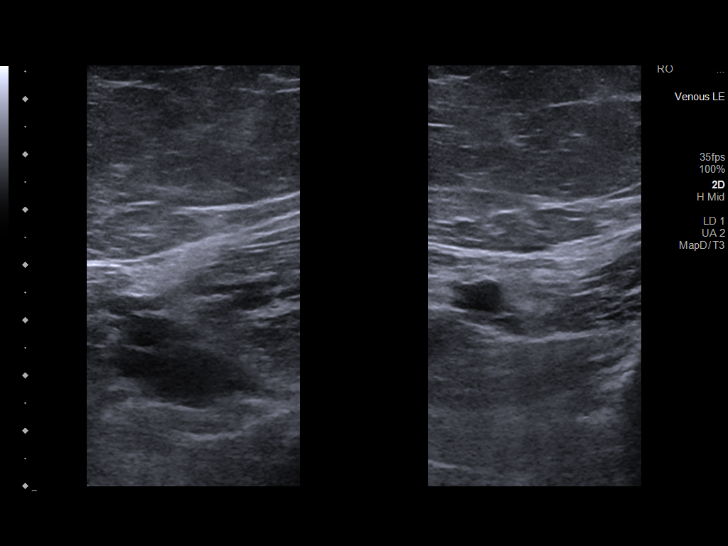
[im 25/39]
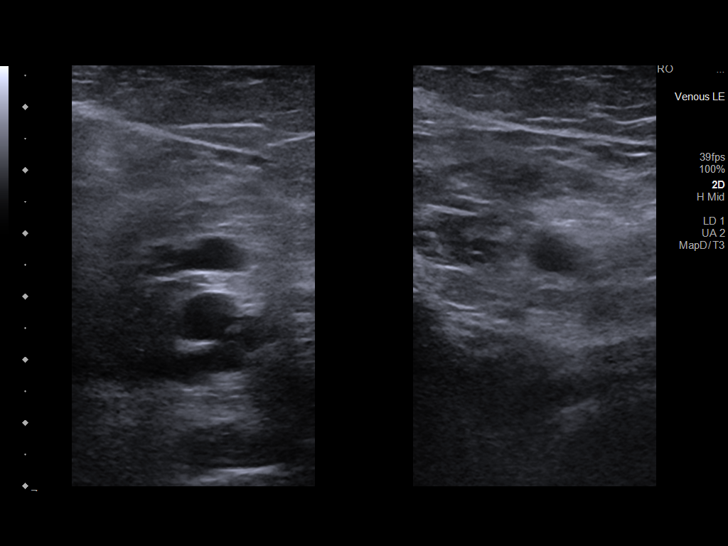
[im 29/39]
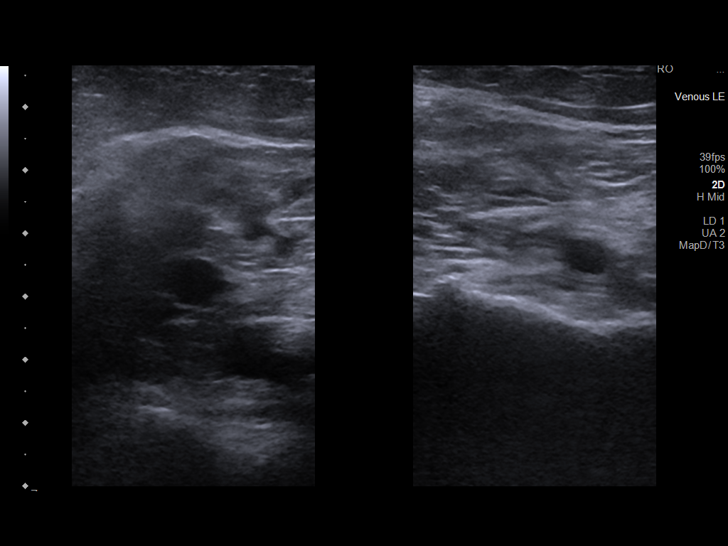
[im 32/39]
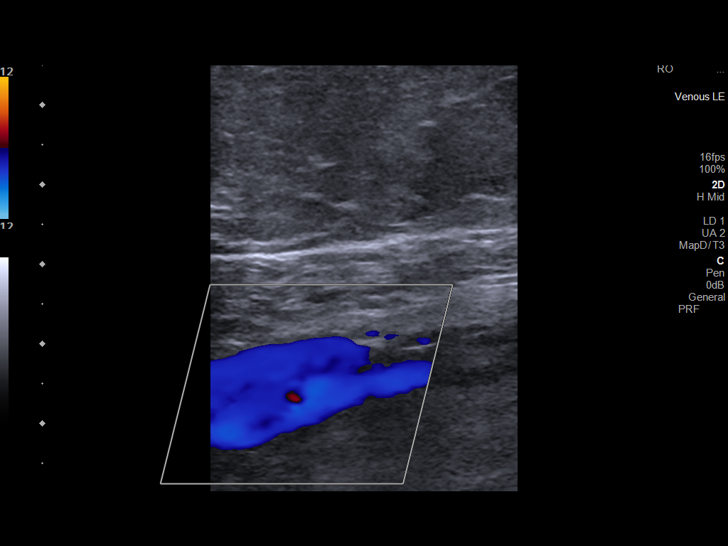
[im 35/39]
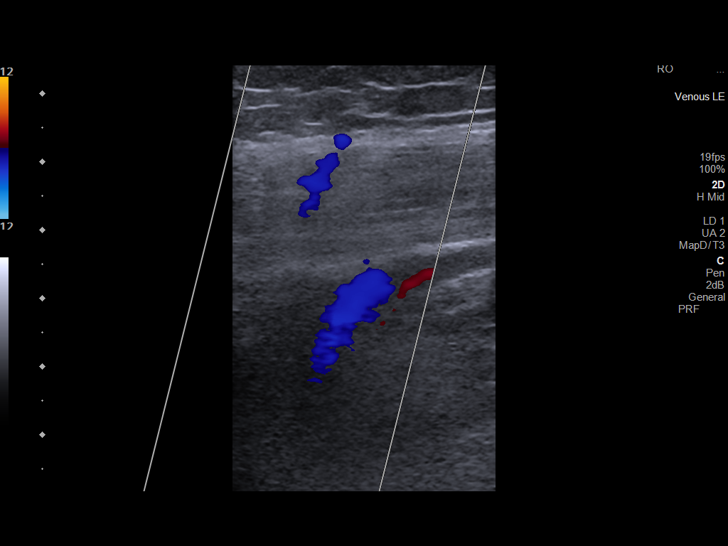
[im 39/39]
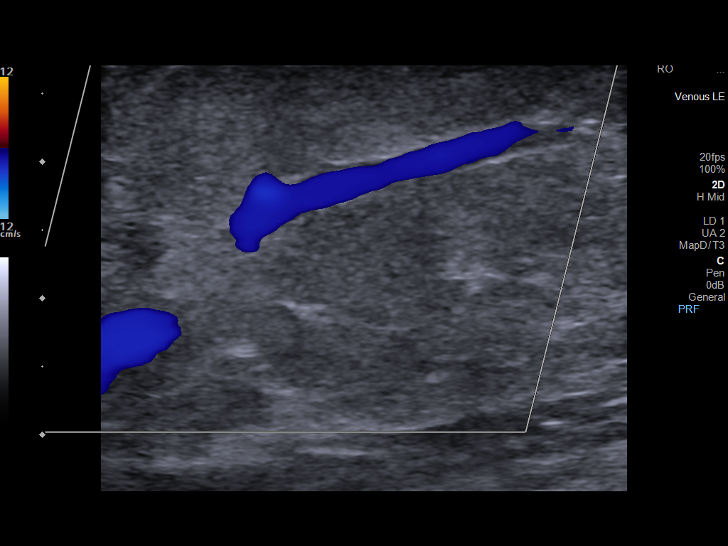

[13 of 24 positions shown; findings below may reference images not displayed]

FINDINGS: Contralateral Common Femoral Vein: Respiratory phasicity is normal
and symmetric with the symptomatic side. No evidence of thrombus.
Normal compressibility.

Common Femoral Vein: No evidence of thrombus. Normal
compressibility, respiratory phasicity and response to augmentation.

Saphenofemoral Junction: No evidence of thrombus. Normal
compressibility and flow on color Doppler imaging.

Profunda Femoral Vein: No evidence of thrombus. Normal
compressibility and flow on color Doppler imaging.

Femoral Vein: No evidence of thrombus. Normal compressibility,
respiratory phasicity and response to augmentation.

Popliteal Vein: No evidence of thrombus. Normal compressibility,
respiratory phasicity and response to augmentation.

Calf Veins: Visualized left deep calf veins are patent without
thrombus.

Other Findings:  None.
IMPRESSION: Negative for deep venous thrombosis in left lower extremity.

## 2023-12-25 ENCOUNTER — Other Ambulatory Visit: Payer: Self-pay

## 2023-12-25 MED ORDER — ROSUVASTATIN CALCIUM 20 MG PO TABS: 20.0000 mg | ORAL_TABLET | Freq: Every day | ORAL | 0 refills | Status: DC

## 2024-01-15 ENCOUNTER — Other Ambulatory Visit: Payer: Self-pay | Admitting: Family Medicine

## 2024-01-19 ENCOUNTER — Ambulatory Visit: Payer: BC Managed Care – PPO | Admitting: Family Medicine

## 2024-01-19 VITALS — BP 124/73 | HR 81 | Temp 97.9°F | Ht 68.0 in | Wt 268.0 lb

## 2024-01-19 DIAGNOSIS — G2581 Restless legs syndrome: Secondary | ICD-10-CM

## 2024-01-19 DIAGNOSIS — I4719 Other supraventricular tachycardia: Secondary | ICD-10-CM | POA: Diagnosis not present

## 2024-01-19 DIAGNOSIS — Z23 Encounter for immunization: Secondary | ICD-10-CM | POA: Diagnosis not present

## 2024-01-19 DIAGNOSIS — E039 Hypothyroidism, unspecified: Secondary | ICD-10-CM

## 2024-01-19 DIAGNOSIS — G4733 Obstructive sleep apnea (adult) (pediatric): Secondary | ICD-10-CM | POA: Diagnosis not present

## 2024-01-19 DIAGNOSIS — I10 Essential (primary) hypertension: Secondary | ICD-10-CM | POA: Diagnosis not present

## 2024-01-19 DIAGNOSIS — F320 Major depressive disorder, single episode, mild: Secondary | ICD-10-CM

## 2024-01-19 DIAGNOSIS — J4540 Moderate persistent asthma, uncomplicated: Secondary | ICD-10-CM

## 2024-01-19 DIAGNOSIS — Z0001 Encounter for general adult medical examination with abnormal findings: Secondary | ICD-10-CM

## 2024-01-19 DIAGNOSIS — Z0000 Encounter for general adult medical examination without abnormal findings: Secondary | ICD-10-CM

## 2024-01-19 DIAGNOSIS — E782 Mixed hyperlipidemia: Secondary | ICD-10-CM

## 2024-01-19 LAB — BAYER DCA HB A1C WAIVED: HB A1C (BAYER DCA - WAIVED): 5.3 % (ref 4.8–5.6)

## 2024-01-19 MED ORDER — NEUPRO 2 MG/24HR TD PT24: 1.0000 | MEDICATED_PATCH | Freq: Every day | TRANSDERMAL | 4 refills | Status: AC

## 2024-01-19 MED ORDER — ROSUVASTATIN CALCIUM 20 MG PO TABS: 20.0000 mg | ORAL_TABLET | Freq: Every day | ORAL | 4 refills | Status: AC

## 2024-01-19 MED ORDER — CARBIDOPA-LEVODOPA 25-100 MG PO TABS: 1.0000 | ORAL_TABLET | Freq: Every day | ORAL | 0 refills | Status: AC | PRN

## 2024-01-19 MED ORDER — BUDESONIDE-FORMOTEROL FUMARATE 80-4.5 MCG/ACT IN AERO: 2.0000 | INHALATION_SPRAY | Freq: Two times a day (BID) | RESPIRATORY_TRACT | 3 refills | Status: AC

## 2024-01-19 MED ORDER — VORTIOXETINE HBR 10 MG PO TABS
10.0000 mg | ORAL_TABLET | Freq: Every morning | ORAL | 3 refills | Status: AC
Start: 2024-01-19 — End: ?

## 2024-01-19 NOTE — Progress Notes (Signed)
 Candace Rodriguez is a 58 y.o. female presents to office today for annual physical exam examination.    Discussed the use of AI scribe software for clinical note transcription with the patient, who gave verbal consent to proceed.  History of Present Illness   Candace Rodriguez is a 58 year old female who presents with weight gain and knee pain.  She has experienced a significant weight gain of 70 pounds, attributed to increased sugar intake, particularly from consuming up to six cans of Cherry Doctor Pepper daily. She is managing her weight with tirzepatide injections every three weeks that is provided by her integrated medicine provider. She wants to reduce sugar intake, noting a past period of successful sugar avoidance.  She underwent knee replacement surgery and reports ongoing right medial knee pain and swelling, especially when ascending and descending stairs. The pain began after returning to work, which involves increased walking and stair use. She uses diclofenac  once daily for pain management and has tried various topical treatments without significant relief.   She experiences restless leg syndrome, particularly in the evenings and early mornings. She uses a 2 mg dose of a medication patch, applying one at night and another in the morning, which she finds somewhat effective.  She has asthma and uses Symbicort  as needed, though she acknowledges not using it daily. She also takes Singulair  daily and reports improved symptoms when using Symbicort  regularly.  Her current medications include Crestor , Cytomel, thyroid  medication (125 mcg on weekends and 112 mcg on weekdays), carbidopa -levodopa  nightly, metoprolol , testosterone weekly, and Trintellix . She also uses topical treatments like clindamycin  and clobetasol  for skin issues prescribed by dermatology.  She reports a recurring issue with warts on her thumb, which have been treated with freezing by a dermatologist. Despite treatment,  new warts continue to appear.      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[employer] , Substance use: none Health Maintenance Due  Topic Date Due   Hepatitis B Vaccines 19-59 Average Risk (1 of 3 - 19+ 3-dose series) Never done   Refills needed today: all  Immunization History  Administered Date(s) Administered   Influenza,inj,Quad PF,6+ Mos 02/16/2017   PNEUMOCOCCAL CONJUGATE-20 01/19/2024   Pneumococcal Polysaccharide-23 03/17/2006, 11/15/2013   Td 12/23/2007   Tdap 01/16/2023   Zoster Recombinant(Shingrix) 12/08/2016, 02/16/2017   Past Medical History:  Diagnosis Date   Asthma    Basal cell carcinoma 04/13/2019   left upper back-[id number] [id number]   Depression    DJD (degenerative joint disease)    Fractures    Left distal radius,  left patella   History of COVID-19 07/01/2019   Hyperlipidemia    Hypertension    Lyme disease    Migraine    Obesity    Obesity, morbid (HCC) 09/13/2012   OSA (obstructive sleep apnea)    Dx by [facility]   Restless leg syndrome    SCC (squamous cell carcinoma) 04/13/2019   right side [id number]   Sleep apnea    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   left [id number]   Thyroid  disease    Tinnitus    Social History   Socioeconomic History   Marital status: Married    Spouse name: Not on file   Number of children: Not on file   Years of education: Not on file   Highest education level: Master's degree (e.g., MA, MS, MEng, MEd, MSW, MBA)  Occupational History   Occupation: Facilities manager and  Language Pathologist  Tobacco Use   Smoking status: Former    Current packs/day: 0.00    Average packs/day: 2.0 packs/day for 10.0 years (20.0 ttl pk-yrs)    Types: Cigarettes    Start date: 05/26/1988    Quit date: 05/26/1998    Years since quitting: 25.6    Passive exposure: Never   Smokeless tobacco: Never  Vaping Use   Vaping status: Never Used  Substance and Sexual Activity   Alcohol use: No     Alcohol/week: 0.0 standard drinks of alcohol   Drug use: No   Sexual activity: Not on file  Other Topics Concern   Not on file  Social History Narrative   Right and left handed   Work Doctor, general practice    Social Drivers of Health   Financial Resource Strain: Low Risk  (01/19/2024)   Overall Financial Resource Strain (CARDIA)    Difficulty of Paying Living Expenses: Not hard at all  Food Insecurity: No Food Insecurity (01/19/2024)   Hunger Vital Sign    Worried About Running Out of Food in the Last Year: Never true    Ran Out of Food in the Last Year: Never true  Transportation Needs: No Transportation Needs (01/19/2024)   PRAPARE - Administrator, Civil Service (Medical): No    Lack of Transportation (Non-Medical): No  Physical Activity: Inactive (01/19/2024)   Exercise Vital Sign    Days of Exercise per Week: 0 days    Minutes of Exercise per Session: Not on file  Stress: No Stress Concern Present (01/19/2024)   [employer] of Occupational Health - Occupational Stress Questionnaire    Feeling of Stress: Only a little  Social Connections: Socially Integrated (01/19/2024)   Social Connection and Isolation Panel    Frequency of Communication with Friends and Family: More than three times a week    Frequency of Social Gatherings with Friends and Family: More than three times a week    Attends Religious Services: More than 4 times per year    Active Member of [employer] or Organizations: Yes    Attends Engineer, structural: More than 4 times per year    Marital Status: Married  Catering manager Violence: Not At Risk (08/17/2023)   Received from [facility]   Humiliation, Afraid, Rape, and Kick questionnaire    Within the last year, have you been afraid of your partner or ex-partner?: No    Within the last year, have you been humiliated or emotionally abused in other ways by your partner or ex-partner?: No    Within the last year, have you been kicked, hit,  slapped, or otherwise physically hurt by your partner or ex-partner?: No    Within the last year, have you been raped or forced to have any kind of sexual activity by your partner or ex-partner?: No   Past Surgical History:  Procedure Laterality Date   CHOLECYSTECTOMY  10/2013   JOINT REPLACEMENT Bilateral 10/2008    knee each partial replacement   KNEE SURGERY     LAPAROSCOPIC ABDOMINAL EXPLORATION  06/1993   PARTIAL GASTRECTOMY  11/16/2012   VSG    TOTAL KNEE ARTHROPLASTY Right 11/05/2023   WRIST SURGERY  09/1998   L   Family History  Problem Relation Age of Onset   Arthritis Mother    Arthritis Father    Hypertension Father    Atrial fibrillation Brother    Cancer Maternal Grandmother    Heart disease Maternal Grandmother  Cancer Maternal Grandfather    Cancer Paternal Grandmother    Heart disease Paternal Grandfather     Current Outpatient Medications:    albuterol  (VENTOLIN  HFA) 108 (90 Base) MCG/ACT inhaler, Inhale 2 puffs into the lungs every 6 (six) hours as needed for wheezing or shortness of breath., Disp: 18 g, Rfl: 11   aspirin EC 81 MG tablet, Take 1 tablet by mouth daily., Disp: , Rfl:    Calcium  200 MG TABS, Take by mouth 2 (two) times daily. , Disp: , Rfl:    Cholecalciferol (VITAMIN D3 PO), Take 20,000 mg by mouth daily., Disp: , Rfl:    clindamycin  (CLEOCIN  T) 1 % external solution, Apply topically., Disp: , Rfl:    clobetasol  (TEMOVATE ) 0.05 % external solution, Apply 1 Application topically daily as needed (dermatitis of scalp)., Disp: 50 mL, Rfl: 0   diclofenac  (VOLTAREN ) 75 MG EC tablet, Take 75 mg by mouth 2 (two) times daily., Disp: , Rfl:    fluticasone  (FLONASE ) 50 MCG/ACT nasal spray, Place 2 sprays into both nostrils daily., Disp: 16 g, Rfl: 6   levocetirizine (XYZAL ) 5 MG tablet, Take 1 tablet (5 mg total) by mouth every evening., Disp: 90 tablet, Rfl: 1   levothyroxine  (SYNTHROID ) 112 MCG tablet, TAKE 1 TABLET BY MOUTH EVERY MORNING BEFORE  BREAKFAST, Disp: 90 tablet, Rfl: 3   levothyroxine  (SYNTHROID ) 125 MCG tablet, TAKE 1 TABLET ( ) ON SATURDAYS & SUNDAYS. CONTINUE THE REAINDER OF THE WEEK, Disp: 24 tablet, Rfl: 4   liothyronine (CYTOMEL) 25 MCG tablet, Take by mouth daily. Half in morning half in afternoon, Disp: , Rfl:    Melatonin 10 MG TABS, Take by mouth daily., Disp: , Rfl:    metoprolol  succinate (TOPROL -XL) 50 MG 24 hr tablet, TAKE ONE (1) TABLET BY MOUTH EVERY DAY, Disp: 90 tablet, Rfl: 3   montelukast  (SINGULAIR ) 10 MG tablet, Take 1 tablet (10 mg total) by mouth at bedtime., Disp: 30 tablet, Rfl: 3   Multiple Vitamin (MULTIVITAMIN) tablet, Take 1 tablet by mouth 3 (three) times daily. Celebrate Bariatric, Disp: , Rfl:    ondansetron (ZOFRAN) 4 MG tablet, Take 4 mg by mouth., Disp: , Rfl:    Probiotic Product (PROBIOTIC PO), Take by mouth daily., Disp: , Rfl:    rotigotine  (NEUPRO ) 2 MG/24HR, Place 1 patch onto the skin daily., Disp: 90 patch, Rfl: 4   testosterone enanthate (DELATESTRYL) 200 MG/ML injection, Inject into the muscle every 7 (seven) days. For IM use only, Disp: , Rfl:    Tirzepatide-Weight Management (ZEPBOUND [place]), , Disp: , Rfl:    budesonide -formoterol  (SYMBICORT ) 80-4.5 MCG/ACT inhaler, Inhale 2 puffs into the lungs 2 (two) times daily., Disp: 10.2 each, Rfl: 3   carbidopa -levodopa  (SINEMET  IR) 25-100 MG tablet, Take 1 tablet by mouth daily as needed., Disp: 90 tablet, Rfl: 0   rosuvastatin  (CRESTOR ) 20 MG tablet, Take 1 tablet (20 mg total) by mouth daily., Disp: 90 tablet, Rfl: 4   vortioxetine  HBr (TRINTELLIX ) 10 MG TABS tablet, Take 1 tablet (10 mg total) by mouth every morning., Disp: 90 tablet, Rfl: 3  Allergies  Allergen Reactions   Soy Allergy (Obsolete) Rash    Rash in mouth.  Other Reaction(s): other   Moxifloxacin     Other Reaction(s): irregular heart rate, irregular heart rate   Gluten Meal Other (See Comments)    Joint Pain   Other Other (See Comments)    Dairy  intolerance   Penicillin G Itching and Other (See Comments)  Turns red in color.   Penicillins    Quinolones Other (See Comments)    Pt can't remeber     ROS: Review of Systems Pertinent items noted in HPI and remainder of comprehensive ROS otherwise negative.    Physical exam BP 124/73   Pulse 81   Temp 97.9 F (36.6 C)   Ht 5' 8 (1.727 m)   Wt 268 lb (121.6 kg)   SpO2 94%   BMI 40.75 kg/m  General appearance: alert, cooperative, appears stated age, no distress, and morbidly obese Head: Normocephalic, without obvious abnormality, atraumatic Eyes: negative findings: lids and lashes normal, conjunctivae and sclerae normal, corneas clear, and pupils equal, round, reactive to light and accomodation Ears: wears hearing aids. Normal TMs bilaterally Nose: Nares normal. Septum midline. Mucosa normal. No drainage or sinus tenderness. Throat: lips, mucosa, and tongue normal; teeth and gums normal Neck: no adenopathy, supple, symmetrical, trachea midline, and thyroid  not enlarged, symmetric, no tenderness/mass/nodules Back: symmetric, no curvature. ROM normal. No CVA tenderness. Lungs: clear to auscultation bilaterally Heart: regular rate and rhythm, S1, S2 normal, no murmur, click, rub or gallop Abdomen: soft, non-tender; bowel sounds normal; no masses,  no organomegaly Extremities: extremities normal, atraumatic, no cyanosis or edema Pulses: 2+ and symmetric Skin: warts on palmar aspect of right thumb. Lymph nodes: Cervical, supraclavicular, and axillary nodes normal. Neurologic: Grossly normal      01/19/2024    8:19 AM 10/29/2023   11:25 AM 10/22/2023   10:33 AM  Depression screen PHQ 2/9  Decreased Interest 1 1 1   Down, Depressed, Hopeless 0 0 0  PHQ - 2 Score 1 1 1   Altered sleeping 1 0 0  Tired, decreased energy 1 2 1   Change in appetite 0 0 0  Feeling bad or failure about yourself  0 0 0  Trouble concentrating 1 0 1  Moving slowly or fidgety/restless 0 0 0  Suicidal  thoughts 0 0 0  PHQ-9 Score 4 3 3   Difficult doing work/chores Not difficult at all Not difficult at all Not difficult at all      01/19/2024    8:20 AM 10/29/2023   11:25 AM 10/22/2023   10:33 AM 10/01/2023    2:26 PM  GAD 7 : Generalized Anxiety Score  Nervous, Anxious, on Edge 0 0 0 0  Control/stop worrying 0 0 0 0  Worry too much - different things 0 0 0 0  Trouble relaxing 0 0 0 0  Restless 0 0 0 0  Easily annoyed or irritable 0 0 0 0  Afraid - awful might happen 0 0 0 0  Total GAD 7 Score 0 0 0 0  Anxiety Difficulty Not difficult at all Not difficult at all Not difficult at all Not difficult at all     Assessment/ Plan: Candace Rodriguez here for annual physical exam.   Annual physical exam  Paroxysmal atrial tachycardia (HCC) - Plan: CMP14+EGFR, CBC with Differential  OSA (obstructive sleep apnea) - Plan: CMP14+EGFR, CBC with Differential  Essential hypertension, benign - Plan: CMP14+EGFR  Acquired hypothyroidism - Plan: CMP14+EGFR, TSH + free T4  Mixed hyperlipidemia - Plan: rosuvastatin  (CRESTOR ) 20 MG tablet, CMP14+EGFR, Lipid Panel  Moderate persistent asthma without complication - Plan: budesonide -formoterol  (SYMBICORT ) 80-4.5 MCG/ACT inhaler, CMP14+EGFR, CBC with Differential  RLS (restless legs syndrome) - Plan: carbidopa -levodopa  (SINEMET  IR) 25-100 MG tablet, CMP14+EGFR, CBC with Differential, rotigotine  (NEUPRO ) 2 MG/24HR  Depression, major, single episode, mild (HCC) - Plan: vortioxetine  HBr (TRINTELLIX ) 10 MG  TABS tablet, CMP14+EGFR  Morbid obesity (HCC) - Plan: Bayer DCA Hb A1c Waived, VITAMIN D  25 Hydroxy (Vit-D Deficiency, Fractures)  Immunization due - Plan: Pneumococcal conjugate vaccine 20-valent (Prevnar 20)  Assessment and Plan    Obesity, morbid associated with OSA Obesity with significant weight gain due to increased sugar intake. Current tirzepatide dose may be insufficient. - Discuss with [facility] about advancing tirzepatide  dose.  Restless legs syndrome Restless legs syndrome with increased evening symptoms due to inconsistent Neupro  use. - Change Neupro  prescription to 2 mg patches for consistent use. - Continue sinemet  - Reassess in one month via video or in-person visit.  Asthma Asthma with previous exacerbations. Inconsistent use of Symbicort  as maintenance therapy. - Instruct to use Symbicort  daily as maintenance therapy. - Consider increasing Symbicort  dose to 160 mcg if breakthrough symptoms persist.  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 - Consider using knee braces for stabilization at work.  Hypothyroidism Hypothyroidism managed with levothyroxine  and Cytomel as directed by specialist  Hyperlipidemia w/ HTN and paroxysmal atrial tachycardia Hyperlipidemia managed with Crestor .  refill sent HTN diet managed Continue Toprol  XL  Depression Depression managed with Trintellix .  Testosterone deficiency Testosterone deficiency managed with weekly testosterone injections.  Chronic pain, multiple sites Chronic pain in shoulders and knee, exacerbated by activity and weight gain. Opioids provide relief but cause sedation.  General Health Maintenance Discussed pneumonia vaccination due to asthma history. Previous vaccination decreased bronchitis episodes. - Administer pneumonia vaccine today. - Schedule next pneumonia vaccine at age 86.       Counseled on healthy lifestyle choices, including diet (rich in fruits, vegetables and lean meats and low in salt and simple carbohydrates) and exercise (at least 30 minutes of moderate physical activity daily).  Patient to follow up 1 year for CPE, 1 m for f/u RLS  Khani Paino M. Jolinda, DO

## 2024-01-20 ENCOUNTER — Ambulatory Visit: Payer: Self-pay | Admitting: Family Medicine

## 2024-01-20 LAB — CBC WITH DIFFERENTIAL/PLATELET
Basophils Absolute: 0 x10E3/uL (ref 0.0–0.2)
Basos: 1 %
EOS (ABSOLUTE): 0.2 x10E3/uL (ref 0.0–0.4)
Eos: 4 %
Hematocrit: 45 % (ref 34.0–46.6)
Hemoglobin: 13.9 g/dL (ref 11.1–15.9)
Immature Grans (Abs): 0 x10E3/uL (ref 0.0–0.1)
Immature Granulocytes: 0 %
Lymphocytes Absolute: 1.7 x10E3/uL (ref 0.7–3.1)
Lymphs: 26 %
MCH: 24.6 pg — ABNORMAL LOW (ref 26.6–33.0)
MCHC: 30.9 g/dL — ABNORMAL LOW (ref 31.5–35.7)
MCV: 80 fL (ref 79–97)
Monocytes Absolute: 0.5 x10E3/uL (ref 0.1–0.9)
Monocytes: 8 %
Neutrophils Absolute: 4.1 x10E3/uL (ref 1.4–7.0)
Neutrophils: 61 %
Platelets: 287 x10E3/uL (ref 150–450)
RBC: 5.65 x10E6/uL — ABNORMAL HIGH (ref 3.77–5.28)
RDW: 14.2 % (ref 11.7–15.4)
WBC: 6.5 x10E3/uL (ref 3.4–10.8)

## 2024-01-20 LAB — CMP14+EGFR
ALT: 29 IU/L (ref 0–32)
AST: 18 IU/L (ref 0–40)
Albumin: 4 g/dL (ref 3.8–4.9)
Alkaline Phosphatase: 91 IU/L (ref 44–121)
BUN/Creatinine Ratio: 14 (ref 9–23)
BUN: 10 mg/dL (ref 6–24)
Bilirubin Total: 0.9 mg/dL (ref 0.0–1.2)
CO2: 27 mmol/L (ref 20–29)
Calcium: 9.2 mg/dL (ref 8.7–10.2)
Chloride: 105 mmol/L (ref 96–106)
Creatinine, Ser: 0.73 mg/dL (ref 0.57–1.00)
Globulin, Total: 1.9 g/dL (ref 1.5–4.5)
Glucose: 94 mg/dL (ref 70–99)
Potassium: 4.1 mmol/L (ref 3.5–5.2)
Sodium: 143 mmol/L (ref 134–144)
Total Protein: 5.9 g/dL — ABNORMAL LOW (ref 6.0–8.5)
eGFR: 95 mL/min/1.73 (ref >59)

## 2024-01-20 LAB — VITAMIN D 25 HYDROXY (VIT D DEFICIENCY, FRACTURES): Vit D, 25-Hydroxy: 62.2 ng/mL (ref 30.0–100.0)

## 2024-01-20 LAB — LIPID PANEL
Chol/HDL Ratio: 2.9 ratio (ref 0.0–4.4)
Cholesterol, Total: 154 mg/dL (ref 100–199)
HDL: 54 mg/dL (ref >39)
LDL Chol Calc (NIH): 76 mg/dL (ref 0–99)
Triglycerides: 135 mg/dL (ref 0–149)
VLDL Cholesterol Cal: 24 mg/dL (ref 5–40)

## 2024-01-20 LAB — TSH+FREE T4
Free T4: 1.16 ng/dL (ref 0.82–1.77)
TSH: 1.48 u[IU]/mL (ref 0.450–4.500)

## 2024-01-21 ENCOUNTER — Other Ambulatory Visit: Payer: Self-pay | Admitting: Family Medicine

## 2024-01-21 DIAGNOSIS — J309 Allergic rhinitis, unspecified: Secondary | ICD-10-CM

## 2024-01-21 DIAGNOSIS — J4540 Moderate persistent asthma, uncomplicated: Secondary | ICD-10-CM

## 2024-01-21 DIAGNOSIS — R051 Acute cough: Secondary | ICD-10-CM

## 2024-01-21 DIAGNOSIS — R062 Wheezing: Secondary | ICD-10-CM

## 2024-02-17 ENCOUNTER — Other Ambulatory Visit: Payer: Self-pay | Admitting: Family Medicine

## 2024-02-17 ENCOUNTER — Other Ambulatory Visit: Payer: Self-pay | Admitting: Cardiology

## 2024-02-17 DIAGNOSIS — J069 Acute upper respiratory infection, unspecified: Secondary | ICD-10-CM

## 2024-02-17 DIAGNOSIS — J309 Allergic rhinitis, unspecified: Secondary | ICD-10-CM

## 2024-02-17 DIAGNOSIS — R062 Wheezing: Secondary | ICD-10-CM

## 2024-04-15 ENCOUNTER — Other Ambulatory Visit: Payer: Self-pay | Admitting: Family Medicine

## 2024-04-15 NOTE — Telephone Encounter (Unsigned)
 Copied from CRM [id number]. Topic: Clinical - Medication Refill >> Apr 15, 2024 10:11 AM [name] wrote: Medication: liothyronine (CYTOMEL) 25 MCG tablet  Has the patient contacted their pharmacy? Yes (Agent: If no, request that the patient contact the pharmacy for the refill. If patient does not wish to contact the pharmacy document the reason why and proceed with request.) (Agent: If yes, when and what did the pharmacy advise?)  This is the patient's preferred pharmacy:  [facility], [place] - [address] [place] [address] Phone: [phone] Fax: [phone]  Is this the correct pharmacy for this prescription? Yes If no, delete pharmacy and type the correct one.   Has the prescription been filled recently? No  Is the patient out of the medication? Yes  Has the patient been seen for an appointment in the last year OR does the patient have an upcoming appointment? Yes  Can we respond through MyChart? No  Agent: Please be advised that Rx refills may take up to 3 business days. We ask that you follow-up with your pharmacy.

## 2024-04-27 NOTE — Progress Notes (Signed)
 Cardiology Office Note    Date:  04/29/2024  ID:  Candace Rodriguez, [name] 1965/12/28, MRN [id number] Cardiologist: Wilbert Bihari, MD { :  History of Present Illness:    Candace Rodriguez is a 58 y.o. female with past medical history of paroxysmal atrial tachycardia, HTN, HLD, RLS, OSA and asthma who presents to the office today for annual follow-up.  She was last examined by Olivia Pavy, PA in 11/2022 and was working out at the gym several days a week without any anginal symptoms. No changes were made to her cardiac medications and she was continued on Toprol -XL 50 mg daily. A coronary calcium  score was ordered given a family history of cardiac issues and this showed a coronary calcium  score of 191.  Was ultimately started on Crestor  given her LDL had been elevated at 147.  In talking with the patient today, she reports her activity has been more limited in the setting of worsening arthritis. She underwent knee replacement in the interim and is also having pain along her shoulders. She is being followed by [facility] for this.  Given her limited activity and in the setting of dietary indiscretion, she has gained almost 70 pounds since her office visit last year. She is planning to make dietary changes over winter break and says the [facility] will be located across from her new office. She denies any chest pain or progressive dyspnea on exertion. Reports palpitations have overall been well-controlled on her current dose of Metoprolol . No specific orthopnea or PND. She does experience occasional lower extremity edema.  Studies Reviewed:   EKG: EKG is not ordered today. EKG from 08/31/2023 is reviewed and shows NSR, HR 75 with no acute ST changes.   Coronary Calcium  Score: 01/2023 FINDINGS: Coronary arteries: Normal origins.   Coronary Calcium  Score:   Left main: 0   Left anterior descending artery: 52.5   Left circumflex artery: 0   Right coronary artery:138   Total: 191    Percentile: 96th   Pericardium: Normal.   Ascending Aorta: Normal caliber.  Scattered calcifications.   Non-cardiac: See separate report from [facility].   IMPRESSION: Coronary calcium  score of 191. This was 96th percentile for age-, race-, and sex-matched controls.  Physical Exam:   VS:  BP 130/82   Pulse 70   Ht 5' 10 (1.778 m)   Wt 280 lb 6.4 oz (127.2 kg)   SpO2 98%   BMI 40.23 kg/m    Wt Readings from Last 3 Encounters:  04/28/24 280 lb 6.4 oz (127.2 kg)  01/19/24 268 lb (121.6 kg)  10/29/23 262 lb 3.2 oz (118.9 kg)     GEN: Pleasant female appearing in no acute distress NECK: No JVD; No carotid bruits CARDIAC: RRR, no murmurs, rubs, gallops RESPIRATORY:  Clear to auscultation without rales, wheezing or rhonchi  ABDOMEN: Appears non-distended. No obvious abdominal masses. EXTREMITIES: No clubbing or cyanosis. No pitting edema.  Distal pedal pulses are 2+ bilaterally.   Assessment and Plan:   1. Paroxysmal Atrial Tachycardia - Symptoms have overall been well-controlled and she denies any recent palpitations. Will continue Toprol -XL 50mg  daily.   2. Essential hypertension - BP is at 130/82 during today's visit. Close to goal and given that she is planning to make dietary changes and resume low-impact exercises, would focus on lifestyle modification. Continue current medical therapy for now with Toprol -XL 50mg  daily.   3. Coronary Calcification by CT/HLD  -  Coronary CT in 01/2023 showed a calcium  score of  191. She denies any recent anginal symptoms and would continue with risk factor modification.  - FLP in 12/2023 showed total cholesterol of 154 and LDL at 76. Continue current medical therapy with Crestor  20mg  daily. If LDL remains above goal over time, could titrate to 40mg  daily.   Signed, Candace Rodriguez Candace Qua, PA-C

## 2024-04-28 ENCOUNTER — Encounter: Payer: Self-pay | Admitting: Student

## 2024-04-28 ENCOUNTER — Ambulatory Visit: Attending: Student | Admitting: Student

## 2024-04-28 VITALS — BP 130/82 | HR 70 | Ht 70.0 in | Wt 280.4 lb

## 2024-04-28 DIAGNOSIS — I10 Essential (primary) hypertension: Secondary | ICD-10-CM | POA: Diagnosis not present

## 2024-04-28 DIAGNOSIS — E785 Hyperlipidemia, unspecified: Secondary | ICD-10-CM | POA: Diagnosis not present

## 2024-04-28 DIAGNOSIS — I4719 Other supraventricular tachycardia: Secondary | ICD-10-CM

## 2024-04-28 DIAGNOSIS — I2510 Atherosclerotic heart disease of native coronary artery without angina pectoris: Secondary | ICD-10-CM

## 2024-04-28 MED ORDER — METOPROLOL SUCCINATE ER 50 MG PO TB24: 50.0000 mg | ORAL_TABLET | Freq: Every day | ORAL | 3 refills | Status: AC

## 2024-04-28 NOTE — Patient Instructions (Signed)
 Medication Instructions:  Your physician recommends that you continue on your current medications as directed. Please refer to the Current Medication list given to you today.  *If you need a refill on your cardiac medications before your next appointment, please call your pharmacy*  Lab Work: None If you have labs (blood work) drawn today and your tests are completely normal, you will receive your results only by: MyChart Message (if you have MyChart) OR A paper copy in the mail If you have any lab test that is abnormal or we need to change your treatment, we will call you to review the results.  Testing/Procedures: None  Follow-Up: At [facility], you and your health needs are our priority.  As part of our continuing mission to provide you with exceptional heart care, our providers are all part of one team.  This team includes your primary Cardiologist (physician) and Advanced Practice Providers or APPs (Physician Assistants and Nurse Practitioners) who all work together to provide you with the care you need, when you need it.  Your next appointment:   1 year(s)  Provider:   You may see Wilbert Bihari, MD or one of the following Advanced Practice Providers on your designated Care Team:   Brittany Strader, PA-C  [place], [place] Olivia [place]avy, [place]     We recommend signing up for the patient portal called MyChart.  Sign up information is provided on this After Visit Summary.  MyChart is used to connect with patients for Virtual Visits (Telemedicine).  Patients are able to view lab/test results, encounter notes, upcoming appointments, etc.  Non-urgent messages can be sent to your provider as well.   To learn more about what you can do with MyChart, go to [email or web address].   Other Instructions

## 2024-04-29 ENCOUNTER — Encounter: Payer: Self-pay | Admitting: Student

## 2024-05-05 ENCOUNTER — Other Ambulatory Visit: Payer: Self-pay | Admitting: Family Medicine

## 2024-05-05 DIAGNOSIS — E039 Hypothyroidism, unspecified: Secondary | ICD-10-CM

## 2024-05-23 ENCOUNTER — Other Ambulatory Visit: Payer: Self-pay | Admitting: Family Medicine

## 2024-05-23 DIAGNOSIS — R051 Acute cough: Secondary | ICD-10-CM

## 2024-05-23 DIAGNOSIS — J4540 Moderate persistent asthma, uncomplicated: Secondary | ICD-10-CM

## 2024-05-23 DIAGNOSIS — J309 Allergic rhinitis, unspecified: Secondary | ICD-10-CM

## 2024-05-23 DIAGNOSIS — R062 Wheezing: Secondary | ICD-10-CM

## 2024-06-30 ENCOUNTER — Ambulatory Visit: Payer: Self-pay

## 2024-06-30 NOTE — Telephone Encounter (Signed)
 Pt is reporting worsening restless leg x 2-3 days, is requesting a virtual appt with Dr. Jolinda. Please call back and advise if there is an appt available today or tomorrow.    FYI Only or Action Required?: Action required by provider: request for appointment.  Patient was last seen in primary care on 01/19/2024 by Jolinda Norene HERO, DO.  Called Nurse Triage reporting Leg Pain.  Symptoms began several days ago.  Interventions attempted: Prescription medications: neupro .  Symptoms are: gradually worsening.  Triage Disposition: See PCP Within 2 Weeks  Patient/caregiver understands and will follow disposition?: Yes     Reason for Triage: Worsening restless leg syndrome, prefers virtual. Medication not working.  Reason for Disposition  Leg pain or muscle cramp is a chronic symptom (recurrent or ongoing AND present > 4 weeks)  Answer Assessment - Initial Assessment Questions 1. ONSET: When did the pain start?      Chronic restless leg syndrome, pain has been out of control x 2-3 days  2. LOCATION: Where is the pain located?      Bilat legs   3. PAIN: How bad is the pain?    (Scale 1-10; or mild, moderate, severe)     No pain just annoyance    5. CAUSE: What do you think is causing the leg pain?     Restless leg  6. OTHER SYMPTOMS: Do you have any other symptoms? (e.g., chest pain, back pain, breathing difficulty, swelling, rash, fever, numbness, weakness) Denies  Protocols used: Leg Pain-A-AH

## 2024-06-30 NOTE — Telephone Encounter (Signed)
 Patient states that the medication is not working for her restless leg syndrome, says that it was after 1:00AM before she went to bed last night. Appointment scheduled per patient request.

## 2024-07-15 ENCOUNTER — Ambulatory Visit

## 2024-09-21 ENCOUNTER — Ambulatory Visit: Admitting: Rheumatology

## 2025-01-27 ENCOUNTER — Encounter: Payer: Self-pay | Admitting: Family Medicine
